# Patient Record
Sex: Female | Born: 1990 | Race: Black or African American | Hispanic: No | Marital: Married | Smoking: Never smoker
Health system: Southern US, Community
[De-identification: ages and names within clinical notes are randomized; demographics above are authoritative.]

## PROBLEM LIST (undated history)

## (undated) ENCOUNTER — Inpatient Hospital Stay (HOSPITAL_COMMUNITY): Payer: Self-pay

## (undated) DIAGNOSIS — N939 Abnormal uterine and vaginal bleeding, unspecified: Secondary | ICD-10-CM

## (undated) DIAGNOSIS — R600 Localized edema: Secondary | ICD-10-CM

## (undated) DIAGNOSIS — E669 Obesity, unspecified: Secondary | ICD-10-CM

## (undated) DIAGNOSIS — G8929 Other chronic pain: Secondary | ICD-10-CM

## (undated) DIAGNOSIS — T7840XA Allergy, unspecified, initial encounter: Secondary | ICD-10-CM

## (undated) DIAGNOSIS — I10 Essential (primary) hypertension: Secondary | ICD-10-CM

## (undated) DIAGNOSIS — J309 Allergic rhinitis, unspecified: Secondary | ICD-10-CM

## (undated) DIAGNOSIS — N12 Tubulo-interstitial nephritis, not specified as acute or chronic: Secondary | ICD-10-CM

## (undated) DIAGNOSIS — K219 Gastro-esophageal reflux disease without esophagitis: Secondary | ICD-10-CM

## (undated) DIAGNOSIS — M1990 Unspecified osteoarthritis, unspecified site: Secondary | ICD-10-CM

## (undated) DIAGNOSIS — K5909 Other constipation: Secondary | ICD-10-CM

## (undated) DIAGNOSIS — E785 Hyperlipidemia, unspecified: Secondary | ICD-10-CM

## (undated) DIAGNOSIS — Z6841 Body Mass Index (BMI) 40.0 and over, adult: Secondary | ICD-10-CM

## (undated) DIAGNOSIS — D500 Iron deficiency anemia secondary to blood loss (chronic): Secondary | ICD-10-CM

## (undated) DIAGNOSIS — R0789 Other chest pain: Secondary | ICD-10-CM

## (undated) DIAGNOSIS — R7303 Prediabetes: Secondary | ICD-10-CM

## (undated) DIAGNOSIS — Z973 Presence of spectacles and contact lenses: Secondary | ICD-10-CM

## (undated) DIAGNOSIS — Z8759 Personal history of other complications of pregnancy, childbirth and the puerperium: Secondary | ICD-10-CM

## (undated) DIAGNOSIS — E559 Vitamin D deficiency, unspecified: Secondary | ICD-10-CM

## (undated) DIAGNOSIS — O24419 Gestational diabetes mellitus in pregnancy, unspecified control: Secondary | ICD-10-CM

## (undated) DIAGNOSIS — R0600 Dyspnea, unspecified: Secondary | ICD-10-CM

## (undated) DIAGNOSIS — Z8632 Personal history of gestational diabetes: Secondary | ICD-10-CM

## (undated) DIAGNOSIS — Z8489 Family history of other specified conditions: Secondary | ICD-10-CM

## (undated) HISTORY — PX: WISDOM TOOTH EXTRACTION: SHX21

## (undated) HISTORY — DX: Allergy, unspecified, initial encounter: T78.40XA

## (undated) HISTORY — PX: NO PAST SURGERIES: SHX2092

---
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
## 2011-01-25 LAB — URINALYSIS, ROUTINE W REFLEX MICROSCOPIC
Bilirubin Urine: NEGATIVE
Glucose, UA: NEGATIVE mg/dL
Hgb urine dipstick: NEGATIVE
Ketones, ur: NEGATIVE mg/dL
Protein, ur: NEGATIVE mg/dL
Urobilinogen, UA: 0.2 mg/dL (ref 0.0–1.0)

## 2011-01-25 LAB — GC/CHLAMYDIA PROBE AMP, GENITAL: GC Probe Amp, Genital: POSITIVE — AB

## 2011-01-25 LAB — WET PREP, GENITAL: Trich, Wet Prep: NONE SEEN

## 2011-04-11 ENCOUNTER — Encounter: Payer: Self-pay | Admitting: Family Medicine

## 2011-10-06 ENCOUNTER — Emergency Department (HOSPITAL_COMMUNITY)
Admission: EM | Admit: 2011-10-06 | Discharge: 2011-10-06 | Disposition: A | Discharge status: Home / Self Care | Payer: No Typology Code available for payment source | Attending: Emergency Medicine | Admitting: Emergency Medicine

## 2011-10-06 ENCOUNTER — Encounter (HOSPITAL_COMMUNITY): Payer: Self-pay | Admitting: *Deleted

## 2011-10-06 DIAGNOSIS — M79609 Pain in unspecified limb: Secondary | ICD-10-CM | POA: Insufficient documentation

## 2011-10-06 DIAGNOSIS — IMO0002 Reserved for concepts with insufficient information to code with codable children: Secondary | ICD-10-CM | POA: Insufficient documentation

## 2011-10-06 DIAGNOSIS — T148XXA Other injury of unspecified body region, initial encounter: Secondary | ICD-10-CM

## 2011-10-06 DIAGNOSIS — J45909 Unspecified asthma, uncomplicated: Secondary | ICD-10-CM | POA: Insufficient documentation

## 2011-10-06 MED ORDER — TETANUS-DIPHTH-ACELL PERTUSSIS 5-2.5-18.5 LF-MCG/0.5 IM SUSP
0.5000 mL | Freq: Once | INTRAMUSCULAR | Status: AC
  Administered 2011-10-06: 0.5 mL via INTRAMUSCULAR
  Filled 2011-10-06: qty 0.5

## 2011-10-06 MED ORDER — IBUPROFEN 800 MG PO TABS
800.0000 mg | ORAL_TABLET | Freq: Once | ORAL | Status: AC
  Administered 2011-10-06: 800 mg via ORAL
  Filled 2011-10-06: qty 1

## 2011-10-06 NOTE — ED Notes (Signed)
Pt shows no signs of neuro deficits. A&O x4 moves all extremities well
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
## 2011-10-10 ENCOUNTER — Emergency Department (HOSPITAL_COMMUNITY)
Admission: EM | Admit: 2011-10-10 | Discharge: 2011-10-10 | Disposition: A | Discharge status: Home / Self Care | Payer: No Typology Code available for payment source | Attending: Emergency Medicine | Admitting: Emergency Medicine

## 2011-10-10 ENCOUNTER — Encounter (HOSPITAL_COMMUNITY): Payer: Self-pay | Admitting: *Deleted

## 2011-10-10 DIAGNOSIS — M79609 Pain in unspecified limb: Secondary | ICD-10-CM | POA: Insufficient documentation
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
## 2011-10-10 NOTE — ED Provider Notes (Signed)
Medical screening examination/treatment/procedure(s) were performed by non-physician practitioner and as supervising physician I was immediately available for consultation/collaboration.   Llewellyn Schoenberger M Monquie Fulgham, MD 10/10/11 2153 
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
## 2012-08-09 ENCOUNTER — Emergency Department (INDEPENDENT_AMBULATORY_CARE_PROVIDER_SITE_OTHER)
Admission: EM | Admit: 2012-08-09 | Discharge: 2012-08-09 | Disposition: A | Discharge status: Home / Self Care | Payer: Self-pay | Source: Home / Self Care | Attending: Emergency Medicine | Admitting: Emergency Medicine

## 2012-08-09 ENCOUNTER — Encounter (HOSPITAL_COMMUNITY): Payer: Self-pay | Admitting: Emergency Medicine

## 2012-08-09 DIAGNOSIS — A499 Bacterial infection, unspecified: Secondary | ICD-10-CM

## 2012-08-09 DIAGNOSIS — B9689 Other specified bacterial agents as the cause of diseases classified elsewhere: Secondary | ICD-10-CM

## 2012-08-09 DIAGNOSIS — N760 Acute vaginitis: Secondary | ICD-10-CM

## 2012-08-09 LAB — POCT URINALYSIS DIP (DEVICE)
Bilirubin Urine: NEGATIVE
Glucose, UA: NEGATIVE mg/dL
Hgb urine dipstick: NEGATIVE
Nitrite: NEGATIVE
Urobilinogen, UA: 0.2 mg/dL (ref 0.0–1.0)

## 2012-08-09 LAB — WET PREP, GENITAL: Yeast Wet Prep HPF POC: NONE SEEN

## 2012-08-09 MED ORDER — METRONIDAZOLE 500 MG PO TABS: 500.0000 mg | ORAL_TABLET | Freq: Two times a day (BID) | ORAL | Status: DC

## 2012-08-09 NOTE — ED Provider Notes (Signed)
Chief Complaint  Patient presents with  . Vaginal Discharge    History of Present Illness:   The patient is a 21 year old female who has had a two-day history of a white discharge and some vaginal irritation. She denies any itching or odor. No pelvic pain, no fever, chills, nausea, vomiting, or urinary symptoms. She is on Depo-Provera. She is sexually active. She's otherwise healthy except for asthma for which she uses albuterol inhaler as needed.  Review of Systems:  Other than noted above, the patient denies any of the following symptoms: Systemic:  No fever, chills, sweats, fatigue, or weight loss. GI:  No abdominal pain, nausea, anorexia, vomiting, diarrhea, constipation, melena or hematochezia. GU:  No dysuria, frequency, urgency, hematuria, vaginal discharge, itching, or abnormal vaginal bleeding. Skin:  No rash or itching.   PMFSH:  Past medical history, family history, social history, meds, and allergies were reviewed.  Physical Exam:   Vital signs:  BP 158/82  Pulse 75  Temp 98.6 F (37 C) (Oral)  Resp 17  SpO2 100% General:  Alert, oriented and in no distress. Lungs:  Breath sounds clear and equal bilaterally.  No wheezes, rales or rhonchi. Heart:  Regular rhythm.  No gallops or murmers. Abdomen:  Soft, flat and non-distended.  No organomegaly or mass.  No tenderness, guarding or rebound.  Bowel sounds normally active. Pelvic exam:  Normal external genitalia, vaginal and cervical mucosa were normal. There is a moderate amount of a thin, white, nonodorous discharge. No cervical motion pain. Uterus is normal in size and nontender. No adnexal masses or tenderness. Skin:  Clear, warm and dry.  Labs:   Results for orders placed during the hospital encounter of 08/09/12  POCT URINALYSIS DIP (DEVICE)      Component Value Range   Glucose, UA NEGATIVE  NEGATIVE mg/dL   Bilirubin Urine NEGATIVE  NEGATIVE   Ketones, ur NEGATIVE  NEGATIVE mg/dL   Specific Gravity, Urine 1.025   1.005 - 1.030   Hgb urine dipstick NEGATIVE  NEGATIVE   pH 5.5  5.0 - 8.0   Protein, ur NEGATIVE  NEGATIVE mg/dL   Urobilinogen, UA 0.2  0.0 - 1.0 mg/dL   Nitrite NEGATIVE  NEGATIVE   Leukocytes, UA TRACE (*) NEGATIVE  WET PREP, GENITAL      Component Value Range   Yeast Wet Prep HPF POC NONE SEEN  NONE SEEN   Trich, Wet Prep NONE SEEN  NONE SEEN   Clue Cells Wet Prep HPF POC MODERATE (*) NONE SEEN   WBC, Wet Prep HPF POC TOO NUMEROUS TO COUNT (*) NONE SEEN     Assessment:  The encounter diagnosis was Bacterial vaginosis.  Plan:   1.  The following meds were prescribed:   New Prescriptions   METRONIDAZOLE (FLAGYL) 500 MG TABLET    Take 1 tablet (500 mg total) by mouth 2 (two) times daily.   2.  The patient was instructed in symptomatic care and handouts were given. 3.  The patient was told to return if becoming worse in any way, if no better in 3 or 4 days, and given some red flag symptoms that would indicate earlier return.    Reuben Likes, MD 08/09/12 2202

## 2012-08-09 NOTE — ED Notes (Signed)
Pt c/o vaginal discharge x2 weeks... Sx include: white discharge, irritation... Denies: fevers, vomiting, nausea, diarrhea, urinary problems, pelvic pain, back pain... Pt is alert w/no signs of distress... Pt is sitting up right on exam table.

## 2012-08-11 LAB — GC/CHLAMYDIA PROBE AMP, GENITAL: Chlamydia, DNA Probe: NEGATIVE

## 2012-08-11 NOTE — ED Notes (Signed)
GC/Chlamydia neg., Wet prep: mod. clue cells, WBC's TNTC.  Pt. adequately treated with Flagyl. Lauren [name] 08/11/2012

## 2012-08-28 ENCOUNTER — Encounter (HOSPITAL_COMMUNITY): Payer: Self-pay | Admitting: Emergency Medicine

## 2012-08-28 ENCOUNTER — Emergency Department (INDEPENDENT_AMBULATORY_CARE_PROVIDER_SITE_OTHER)
Admission: EM | Admit: 2012-08-28 | Discharge: 2012-08-28 | Disposition: A | Discharge status: Home / Self Care | Payer: Self-pay | Source: Home / Self Care

## 2012-08-28 DIAGNOSIS — T7840XA Allergy, unspecified, initial encounter: Secondary | ICD-10-CM

## 2012-08-28 MED ORDER — PREDNISONE 20 MG PO TABS: ORAL_TABLET | ORAL | Status: DC

## 2012-08-28 MED ORDER — TRIAMCINOLONE ACETONIDE 40 MG/ML IJ SUSP
INTRAMUSCULAR | Status: AC
  Filled 2012-08-28: qty 5

## 2012-08-28 MED ORDER — TRIAMCINOLONE ACETONIDE 40 MG/ML IJ SUSP
40.0000 mg | Freq: Once | INTRAMUSCULAR | Status: AC
  Administered 2012-08-28: 40 mg via INTRAMUSCULAR

## 2012-08-28 NOTE — ED Provider Notes (Signed)
History     CSN: [id number]  Arrival date & time 08/28/12  1536   None     Chief Complaint  Patient presents with  . Rash    (Consider location/radiation/quality/duration/timing/severity/associated sxs/prior treatment) HPI Comments: After eating at a chicken restaurant last p.m. the patient and her mother developed itching. The patient also developed facial edema associated with itching primarily on her face neck and upper torso. She took a Benadryl and much of swelling and itching has decreased. He did not develop swelling or discomfort in the oropharynx or throat. Her mother showed me a photograph of when she had swelling of the face and lips. She is much improved based on the initial photograph. Her itching is minor nail and facial swelling his nearly abated.   Past Medical History  Diagnosis Date  . Allergy   . Asthma     History reviewed. No pertinent past surgical history.  Family History  Problem Relation Age of Onset  . Hypertension Mother   . Diabetes Father   . Cancer Other     History  Substance Use Topics  . Smoking status: Never Smoker   . Smokeless tobacco: Not on file  . Alcohol Use: No    OB History    Grav Para Term Preterm Abortions TAB SAB Ect Mult Living                  Review of Systems  Constitutional: Negative for fever, activity change and fatigue.  HENT: Positive for facial swelling. Negative for congestion, sore throat, rhinorrhea, neck pain and ear discharge.   Eyes: Negative.   Respiratory: Negative for cough, shortness of breath and wheezing.   Cardiovascular: Negative for chest pain and palpitations.  Gastrointestinal: Negative.   Genitourinary: Negative.   Musculoskeletal: Negative.   Skin: Positive for rash. Negative for color change and pallor.  Neurological: Negative.   Psychiatric/Behavioral: Negative.     Allergies  Review of patient's allergies indicates no known allergies.  Home Medications   Current Outpatient Rx   Name  Route  Sig  Dispense  Refill  . DIPHENHYDRAMINE HCL 25 MG PO CAPS   Oral   Take 25 mg by mouth every 6 (six) hours as needed.         Marland Kitchen MEDROXYPROGESTERONE ACETATE 150 MG/ML IM SUSP   Intramuscular   Inject 150 mg into the muscle every 3 (three) months.           Marland Kitchen METRONIDAZOLE 500 MG PO TABS   Oral   Take 1 tablet (500 mg total) by mouth 2 (two) times daily.   14 tablet   0   . PREDNISONE 20 MG PO TABS      Take 2 tabs q d for 2 days, then 1 tab for 2 days   6 tablet   0     BP 118/78  Pulse 118  Temp 98.4 F (36.9 C) (Oral)  Resp 20  SpO2 100%  Physical Exam  Constitutional: She is oriented to person, place, and time. She appears well-developed and well-nourished. No distress.  HENT:  Head: Normocephalic and atraumatic.  Nose: Nose normal.  Mouth/Throat: No oropharyngeal exudate.       No noticeable facial swelling during the exam. Oropharynx mildly erythematous with mild amount of PND  Eyes: EOM are normal. Pupils are equal, round, and reactive to light.  Neck: Normal range of motion. Neck supple.  Cardiovascular: Normal rate and normal heart sounds.   Pulmonary/Chest: Effort  normal and breath sounds normal. No respiratory distress. She has no wheezes. She has no rales.  Abdominal: Soft. There is no tenderness.  Musculoskeletal: Normal range of motion.  Lymphadenopathy:    She has no cervical adenopathy.  Neurological: She is alert and oriented to person, place, and time. No cranial nerve deficit.  Skin: Skin is warm and dry.  Psychiatric: She has a normal mood and affect.    ED Course  Procedures (including critical care time)  Labs Reviewed - No data to display No results found.   1. Allergic reaction       MDM  Continue the Benadryl 25 mg every 4 hours when necessary symptoms Kenalog 40 mg IM now Prednisone 40 mg every day for 2 days then 20 mg every day for 2 days. Per any new symptoms problems or worsening return of swelling or  itching or other symptoms either return to the emergency department.        Hayden Rasmussen, NP 08/28/12 1711

## 2012-08-28 NOTE — ED Notes (Addendum)
Reports swollen face and itchy skin.  Rash to face, itching on face and all over.  Denies fever, no bug bites.  Patient and mother think this is related to something they ate at a restaurant.  Both had itching, but patient seems to be itching worse and longer , and did have swelling

## 2012-08-29 NOTE — ED Provider Notes (Signed)
Medical screening examination/treatment/procedure(s) were performed by resident physician or non-physician practitioner and as supervising physician I was immediately available for consultation/collaboration.   Barkley Bruns MD.    Linna Hoff, MD 08/29/12 [phone]

## 2012-10-22 NOTE — L&D Delivery Note (Signed)
Delivery Note   Pt rapidly progressed to complete, I was called to BS w pt bearing down involuntarily, FHR overall reassuring w early variable decels, pt was on hands and knees, FHR decel to 50's as vtx began to crown, pt continued pushing well over 1-2 ctx   At 10:08 AM a viable female was delivered via Vaginal, Spontaneous Delivery (Presentation: Left Occiput Anterior).  Delivered through nuchal cord, cord was immediately clamped and cut and infant taken to warmer, APGAR: 2, 9; weight 6 lb 12.4 oz (3073 g).   Placenta status: Intact, Spontaneous.  Cord: 3 vessels with the following complications: None.  Cord pH: not collected   Anesthesia: None  Episiotomy: None Lacerations: 1st degree on R labia  Suture Repair: 3.0 vicryl rapide 4-0 monocryl  Est. Blood Loss (mL): 300  Mom to postpartum.  Baby to Couplet care / Skin to Skin. Pt plans to BF Mom and baby stable in recovery room Routine PP orders initiated   Jazilyn Siegenthaler M 09/17/2013, 8:20 PM

## 2013-01-29 ENCOUNTER — Encounter: Payer: Self-pay | Admitting: Family Medicine

## 2013-01-29 DIAGNOSIS — J309 Allergic rhinitis, unspecified: Secondary | ICD-10-CM | POA: Insufficient documentation

## 2013-01-30 ENCOUNTER — Ambulatory Visit (INDEPENDENT_AMBULATORY_CARE_PROVIDER_SITE_OTHER): Payer: Medicaid Other | Admitting: Physician Assistant

## 2013-01-30 ENCOUNTER — Encounter: Payer: Self-pay | Admitting: Physician Assistant

## 2013-01-30 VITALS — BP 128/78 | HR 100 | Temp 99.0°F | Resp 18 | Ht 64.0 in | Wt 231.0 lb

## 2013-01-30 DIAGNOSIS — N912 Amenorrhea, unspecified: Secondary | ICD-10-CM

## 2013-01-30 DIAGNOSIS — Z3201 Encounter for pregnancy test, result positive: Secondary | ICD-10-CM

## 2013-01-30 NOTE — Progress Notes (Signed)
   Patient ID: Lauren [name] MRN: [id number], DOB: 1991/03/26, 22 y.o. Date of Encounter: 01/30/2013, 1:08 PM    Chief Complaint:  Chief Complaint  Patient presents with  . thinks she pregnant    not sure about last menses  thinks last Depo shot was December     HPI: 22 y.o. year old female here for f/u. Says she did home pregnancy test 5 days ago and it was positive. Here for f/u. Has never seen a Gyn.  Was on DepoProvera here-she isnt sure when last injection was. Says she stopped doing them "just because she didn't want to keep doing it".   Also, she doesn't really pay attention to her menstrual cycle. Reports that she had a period in February but has no idea what date in February. Has had no menses "since February."  No prior pregnancy per pt report.   Home Meds:NONE                                     Allergies: No Known Allergies    Review of Systems: Pertinent ROS Negative   Physical Exam: Blood pressure 128/78, pulse 100, temperature 99 F (37.2 C), temperature source Oral, resp. rate 18, height 5\' 4"  (1.626 m), weight 231 lb (104.781 kg)., Body mass index is 39.63 kg/(m^2). General: Well developed, well nourished,AAF. in no acute distress.  Neck: Supple. No thyromegaly. Full ROM. No lymphadenopathy. Lungs: Clear bilaterally to auscultation without wheezes, rales, or rhonchi. Breathing is unlabored. Heart: RRR with S1 S2. No murmurs, rubs, or gallops appreciated. Abdomen: Soft, non-tender, non-distended with normoactive bowel sounds. No hepatomegaly. No rebound/guarding. No obvious abdominal masses. Msk:  Strength and tone normal for age. Extremities/Skin: Warm and dry. No clubbing or cyanosis. No edema. No rashes or suspicious lesions. Neuro: Alert and oriented X 3. Moves all extremities spontaneously. Gait is normal. CNII-XII grossly in tact. Psych:  Responds to questions appropriately with a normal affect.   Labs:  Results for orders placed in visit on  01/30/13  PREGNANCY, URINE      Result Value Range   Preg Test, Ur POS        ASSESSMENT AND PLAN:  22 y.o. year old female with  1. Amenorrhea - Pregnancy, urine positive  - Ambulatory referral to Obstetrics / Gynecology  2. Positive pregnancy test - Ambulatory referral to Obstetrics / Gynecology  She reports that she uses no cigarettes, no alcohol, no illicit drugs. Uses no Prescription Medications. Sometimes uses occasional advil. Told her to continue to avoid all of the above. Also told her to stop using Advil. Do not use any otc meds at all without first checking the OB. Start Centrum Multivitamin Today ASAP. Take this daily.  I will refer to Ob for f/u. She voices understanding and agrees.  Murray Hodgkins [place], [place], [facility] 01/30/2013 1:08 PM

## 2013-02-05 ENCOUNTER — Emergency Department (HOSPITAL_COMMUNITY): Payer: Medicaid Other

## 2013-02-05 ENCOUNTER — Encounter (HOSPITAL_COMMUNITY): Payer: Self-pay | Admitting: Emergency Medicine

## 2013-02-05 ENCOUNTER — Emergency Department (HOSPITAL_COMMUNITY)
Admission: EM | Admit: 2013-02-05 | Discharge: 2013-02-05 | Disposition: A | Discharge status: Home / Self Care | Payer: Medicaid Other | Attending: Emergency Medicine | Admitting: Emergency Medicine

## 2013-02-05 DIAGNOSIS — Z7982 Long term (current) use of aspirin: Secondary | ICD-10-CM | POA: Insufficient documentation

## 2013-02-05 DIAGNOSIS — O210 Mild hyperemesis gravidarum: Secondary | ICD-10-CM

## 2013-02-05 DIAGNOSIS — J45909 Unspecified asthma, uncomplicated: Secondary | ICD-10-CM | POA: Insufficient documentation

## 2013-02-05 DIAGNOSIS — R1011 Right upper quadrant pain: Secondary | ICD-10-CM | POA: Insufficient documentation

## 2013-02-05 DIAGNOSIS — O209 Hemorrhage in early pregnancy, unspecified: Secondary | ICD-10-CM | POA: Insufficient documentation

## 2013-02-05 DIAGNOSIS — O9989 Other specified diseases and conditions complicating pregnancy, childbirth and the puerperium: Secondary | ICD-10-CM | POA: Insufficient documentation

## 2013-02-05 DIAGNOSIS — O200 Threatened abortion: Secondary | ICD-10-CM

## 2013-02-05 DIAGNOSIS — Z8709 Personal history of other diseases of the respiratory system: Secondary | ICD-10-CM | POA: Insufficient documentation

## 2013-02-05 LAB — URINALYSIS, ROUTINE W REFLEX MICROSCOPIC
Bilirubin Urine: NEGATIVE
Glucose, UA: NEGATIVE mg/dL
Hgb urine dipstick: NEGATIVE
Ketones, ur: 40 mg/dL — AB
Leukocytes, UA: NEGATIVE
Nitrite: NEGATIVE
Protein, ur: NEGATIVE mg/dL
Specific Gravity, Urine: 1.025 (ref 1.005–1.030)
Urobilinogen, UA: 0.2 mg/dL (ref 0.0–1.0)
pH: 5.5 (ref 5.0–8.0)

## 2013-02-05 LAB — COMPREHENSIVE METABOLIC PANEL WITH GFR
ALT: 12 U/L (ref 0–35)
AST: 11 U/L (ref 0–37)
Albumin: 3.9 g/dL (ref 3.5–5.2)
Alkaline Phosphatase: 41 U/L (ref 39–117)
BUN: 13 mg/dL (ref 6–23)
CO2: 24 meq/L (ref 19–32)
Calcium: 9.7 mg/dL (ref 8.4–10.5)
Chloride: 99 meq/L (ref 96–112)
Creatinine, Ser: 0.66 mg/dL (ref 0.50–1.10)
GFR calc Af Amer: >90 mL/min
GFR calc non Af Amer: >90 mL/min
Glucose, Bld: 104 mg/dL — ABNORMAL HIGH (ref 70–99)
Potassium: 3.6 meq/L (ref 3.5–5.1)
Sodium: 134 meq/L — ABNORMAL LOW (ref 135–145)
Total Bilirubin: 0.3 mg/dL (ref 0.3–1.2)
Total Protein: 7.6 g/dL (ref 6.0–8.3)

## 2013-02-05 LAB — CBC WITH DIFFERENTIAL/PLATELET
Basophils Absolute: 0 10*3/uL (ref 0.0–0.1)
Basophils Relative: 0 % (ref 0–1)
Eosinophils Absolute: 0.1 10*3/uL (ref 0.0–0.7)
Eosinophils Relative: 1 % (ref 0–5)
HCT: 36.1 % (ref 36.0–46.0)
Hemoglobin: 12.7 g/dL (ref 12.0–15.0)
Lymphocytes Relative: 39 % (ref 12–46)
Lymphs Abs: 3.6 10*3/uL (ref 0.7–4.0)
MCH: 29.2 pg (ref 26.0–34.0)
MCHC: 35.2 g/dL (ref 30.0–36.0)
MCV: 83 fL (ref 78.0–100.0)
Monocytes Absolute: 0.5 10*3/uL (ref 0.1–1.0)
Monocytes Relative: 5 % (ref 3–12)
Neutro Abs: 5.2 10*3/uL (ref 1.7–7.7)
Neutrophils Relative %: 55 % (ref 43–77)
Platelets: 259 10*3/uL (ref 150–400)
RBC: 4.35 MIL/uL (ref 3.87–5.11)
RDW: 12.7 % (ref 11.5–15.5)
WBC: 9.4 10*3/uL (ref 4.0–10.5)

## 2013-02-05 LAB — TYPE AND SCREEN: Antibody Screen: NEGATIVE

## 2013-02-05 LAB — POCT PREGNANCY, URINE: Preg Test, Ur: POSITIVE — AB

## 2013-02-05 LAB — HCG, QUANTITATIVE, PREGNANCY: hCG, Beta Chain, Quant, S: 39721 m[IU]/mL — ABNORMAL HIGH

## 2013-02-05 LAB — ABO/RH: ABO/RH(D): AB NEG

## 2013-02-05 MED ORDER — RHO D IMMUNE GLOBULIN 1500 UNIT/2ML IJ SOLN
300.0000 ug | Freq: Once | INTRAMUSCULAR | Status: AC
  Administered 2013-02-05: 300 ug via INTRAVENOUS

## 2013-02-05 MED ORDER — ONDANSETRON HCL 4 MG/2ML IJ SOLN
4.0000 mg | Freq: Once | INTRAMUSCULAR | Status: AC
  Administered 2013-02-05: 4 mg via INTRAVENOUS
  Filled 2013-02-05: qty 2

## 2013-02-05 MED ORDER — SODIUM CHLORIDE 0.9 % IV BOLUS (SEPSIS)
1000.0000 mL | Freq: Once | INTRAVENOUS | Status: AC
  Administered 2013-02-05: 1000 mL via INTRAVENOUS

## 2013-02-05 MED ORDER — FOLIC ACID 1 MG PO TABS: 1.0000 mg | ORAL_TABLET | Freq: Every day | ORAL | Status: DC

## 2013-02-05 MED ORDER — ONDANSETRON 8 MG PO TBDP: 8.0000 mg | ORAL_TABLET | Freq: Three times a day (TID) | ORAL | Status: DC | PRN

## 2013-02-05 NOTE — ED Notes (Signed)
Pt dc'd home w/all belongings, alert and ambulatory upon dc, pt verbalizes understanding of dc instructions, 2 new rx given, pt driven home by family at bedside

## 2013-02-05 NOTE — ED Notes (Signed)
PT. REPORTS INTERMITTENT EMESIS / MID ABDOMINAL CRAMPING FOR SEVERAL DAYS , STATES POSITIVE HOME PREGNANCY TEST UNSURE OF AOG  AND LMP.

## 2013-02-05 NOTE — ED Notes (Signed)
Waiting for medication to come up from pharmacy before discharging pt home

## 2013-02-05 NOTE — ED Provider Notes (Signed)
History     CSN: [id number]  Arrival date & time 02/05/13  0017   First MD Initiated Contact with Patient 02/05/13 [phone]      Chief Complaint  Patient presents with  . Emesis During Pregnancy  . Abdominal Pain    (Consider location/radiation/quality/duration/timing/severity/associated sxs/prior treatment) HPI Comments: g1po patient comes in with cc of emesis, abd pain. Pt is not sure how far along she is, she has not seen an OB doctor yet. Pt has pain in the RUQ, and no pelvis pain. She also has been having nausea, but no emesis. No fevers, chills. No dizziness, lightheadedness. No hx of gall stones. No pelvic pain, uti like sx - but pt is indicating intermittent vaginal spotting. She is not sexually active at this point, no trauma.  Patient is a 22 y.o. female presenting with abdominal pain. The history is provided by the patient.  Abdominal Pain Associated symptoms: nausea and vaginal bleeding   Associated symptoms: no chest pain, no dysuria, no shortness of breath, no vaginal discharge and no vomiting     Past Medical History  Diagnosis Date  . Asthma   . Allergy     allergic rhinitis    History reviewed. No pertinent past surgical history.  Family History  Problem Relation Age of Onset  . Hypertension Mother   . Diabetes Father   . Cancer Other     History  Substance Use Topics  . Smoking status: Never Smoker   . Smokeless tobacco: Not on file  . Alcohol Use: No    OB History   Grav Para Term Preterm Abortions TAB SAB Ect Mult Living                  Review of Systems  Constitutional: Negative for activity change.  HENT: Negative for neck pain.   Respiratory: Negative for shortness of breath.   Cardiovascular: Negative for chest pain.  Gastrointestinal: Positive for nausea and abdominal pain. Negative for vomiting.  Genitourinary: Positive for vaginal bleeding. Negative for dysuria, frequency, flank pain, decreased urine volume and vaginal discharge.   Neurological: Negative for dizziness and headaches.    Allergies  Other  Home Medications   Current Outpatient Rx  Name  Route  Sig  Dispense  Refill  . acetaminophen (TYLENOL) 500 MG tablet   Oral   Take 500 mg by mouth every 6 (six) hours as needed for pain.         Marland Kitchen aspirin 325 MG tablet   Oral   Take 325 mg by mouth every 4 (four) hours as needed for pain.         Marland Kitchen ibuprofen (ADVIL,MOTRIN) 200 MG tablet   Oral   Take 400 mg by mouth every 6 (six) hours as needed for pain.           BP 116/55  Pulse 82  Temp(Src) 98.1 F (36.7 C) (Oral)  Resp 18  SpO2 100%  Physical Exam  Nursing note and vitals reviewed. Constitutional: She is oriented to person, place, and time. She appears well-developed and well-nourished.  HENT:  Head: Normocephalic and atraumatic.  Eyes: EOM are normal. Pupils are equal, round, and reactive to light.  Neck: Neck supple.  Cardiovascular: Normal rate, regular rhythm and normal heart sounds.   No murmur heard. Pulmonary/Chest: Effort normal. No respiratory distress.  Abdominal: Soft. She exhibits no distension. There is tenderness. There is no rebound and no guarding.  RUQ tendnerness, no murphys, no pelvic pain  Neurological: She is alert and oriented to person, place, and time.  Skin: Skin is warm and dry.    ED Course  Procedures (including critical care time)  Labs Reviewed  URINALYSIS, ROUTINE W REFLEX MICROSCOPIC - Abnormal; Notable for the following:    APPearance CLOUDY (*)    Ketones, ur 40 (*)    All other components within normal limits  COMPREHENSIVE METABOLIC PANEL - Abnormal; Notable for the following:    Sodium 134 (*)    Glucose, Bld 104 (*)    All other components within normal limits  HCG, QUANTITATIVE, PREGNANCY - Abnormal; Notable for the following:    hCG, Beta Chain, Quant, [place] [address] (*)    All other components within normal limits  POCT PREGNANCY, URINE - Abnormal; Notable for the following:    Preg  Test, Ur POSITIVE (*)    All other components within normal limits  CBC WITH DIFFERENTIAL  TYPE AND SCREEN  ABO/RH   US Abdomen Complete  02/05/2013  *RADIOLOGY REPORT*  Clinical Data:  Abdominal pain.  ABDOMINAL ULTRASOUND COMPLETE  Comparison:  None  Findings:  Gallbladder:  The gallbladder is normal in appearance, without evidence for gallstones, gallbladder wall thickening or pericholecystic fluid.  No ultrasonographic Murphy's sign is elicited.  Common Bile Duct:  0.2 cm in diameter; within normal limits in caliber.  Liver:  Normal parenchymal echogenicity and echotexture; no focal lesions identified.  Limited Doppler evaluation demonstrates normal blood flow within the liver.  IVC:  Unremarkable in appearance.  Pancreas:  Although the pancreas is difficult to visualize in its entirety due to overlying bowel gas, no focal pancreatic abnormality is identified.  Spleen:  9.9 cm in length; within normal limits in size and echotexture.  Right kidney:  10.5 cm in length; normal in size, configuration and parenchymal echogenicity.  No evidence of mass or hydronephrosis.  Left kidney:  10.9 cm in length; normal in size, configuration and parenchymal echogenicity.  No evidence of mass or hydronephrosis.  Abdominal Aorta:  Normal in caliber; no aneurysm identified.  IMPRESSION: Unremarkable abdominal ultrasound.   Original Report Authenticated By: Tonia Ghent, M.D.    US Ob Comp Less 14 Wks  02/05/2013  *RADIOLOGY REPORT*  Clinical Data: Abdominal pain.  OBSTETRIC <14 WK ULTRASOUND  Technique:  Transabdominal ultrasound was performed for evaluation of the gestation as well as the maternal uterus and adnexal regions.  Comparison:  None.  Intrauterine gestational sac: Visualized/normal in shape. Yolk sac: Yes Embryo: Yes Cardiac Activity: Yes Heart Rate: 150 bpm  CRL:  1.4 cm  7 w  4 d        [place] EDC: 09/20/2013  Maternal uterus/Adnexae: No subchorionic hemorrhage is noted.  The uterus is otherwise unremarkable  in appearance.  The ovaries are not visualized.  The adnexa are grossly unremarkable in appearance, though not well assessed.  No free fluid is seen within the pelvic cul-de-sac.  IMPRESSION: Single live intrauterine pregnancy noted, with a crown-rump length of 1.4 cm, corresponding to a gestational age of [redacted] weeks 4 days. This reflects an estimated date of delivery of September 20, 2013.   Original Report Authenticated By: Tonia Ghent, M.D.      No diagnosis found.    MDM  Pt comes in with cc of emesis, RUQ abd pain. Pt on ROS is positive for spotting.  Will get UA od the RUQ and also US pelvis to ensure there is no IUD or ectopic.  5:12 AM Rh Blood type is  negative. Will give rhogam.   Derwood Kaplan, MD 02/05/13 [id number]

## 2013-02-06 LAB — RH IG WORKUP (INCLUDES ABO/RH)
ABO/RH(D): AB NEG
Gestational Age(Wks): 7
Unit division: 0

## 2013-02-16 ENCOUNTER — Encounter: Payer: Self-pay | Admitting: Physician Assistant

## 2013-03-17 ENCOUNTER — Encounter (HOSPITAL_COMMUNITY): Payer: Self-pay | Admitting: *Deleted

## 2013-03-17 ENCOUNTER — Inpatient Hospital Stay (HOSPITAL_COMMUNITY)
Admission: AD | Admit: 2013-03-17 | Discharge: 2013-03-17 | Disposition: A | Discharge status: Home / Self Care | Payer: Medicaid Other | Source: Ambulatory Visit | Attending: Obstetrics & Gynecology | Admitting: Obstetrics & Gynecology

## 2013-03-17 DIAGNOSIS — R109 Unspecified abdominal pain: Secondary | ICD-10-CM | POA: Insufficient documentation

## 2013-03-17 DIAGNOSIS — O209 Hemorrhage in early pregnancy, unspecified: Secondary | ICD-10-CM

## 2013-03-17 LAB — WET PREP, GENITAL
Clue Cells Wet Prep HPF POC: NONE SEEN
Trich, Wet Prep: NONE SEEN
Yeast Wet Prep HPF POC: NONE SEEN

## 2013-03-17 NOTE — Progress Notes (Signed)
Pt states she is having spotting

## 2013-03-17 NOTE — MAU Note (Signed)
pT STATES SHE STARTED HAVING VAGINAL BLEEDING LAST NIGHT THAT BROWN AND HAS TURNED RED THIS MORNING

## 2013-03-17 NOTE — MAU Provider Note (Signed)
Chief Complaint: Vaginal Bleeding and Abdominal Pain   First Provider Initiated Contact with Patient 03/17/13 1611     SUBJECTIVE HPI: Lauren [name] is a 22 y.o. G1P0 at [redacted]w[redacted]d by LMP who presents with spotting per vagina that began yesterday and was brown, redder today. Not enough to require peripad. No abdominal pain. No antecedent intercourse or irritative vaginal discharge. Seen at [facility] 02/05/2013 for abdominal pain and had US showing viable IUP [redacted]w[redacted]d.  AB neg> received Rhophylac 02/07/13 Has applied for [facility] and in process of getting NOB appointment with PMD.   Past Medical History  Diagnosis Date  . Asthma   . Allergy     allergic rhinitis   OB History   Grav Para Term Preterm Abortions TAB SAB Ect Mult Living   1              # Outc Date GA Lbr Len/2nd Wgt Sex Del Anes PTL Lv   1 CUR              Past Surgical History  Procedure Laterality Date  . No past surgeries     History   Social History  . Marital Status: Single    Spouse Name: N/A    Number of Children: N/A  . Years of Education: N/A   Occupational History  . Not on file.   Social History Main Topics  . Smoking status: Never Smoker   . Smokeless tobacco: Not on file  . Alcohol Use: No  . Drug Use: No  . Sexually Active: Not on file   Other Topics Concern  . Not on file   Social History Narrative  . No narrative on file   No current facility-administered medications on file prior to encounter.   Current Outpatient Prescriptions on File Prior to Encounter  Medication Sig Dispense Refill  . acetaminophen (TYLENOL) 500 MG tablet Take 500 mg by mouth every 6 (six) hours as needed for pain.      Marland Kitchen aspirin 325 MG tablet Take 325 mg by mouth every 4 (four) hours as needed for pain.      . folic acid (FOLVITE) 1 MG tablet Take 1 tablet (1 mg total) by mouth daily.  100 tablet  3  . ibuprofen (ADVIL,MOTRIN) 200 MG tablet Take 400 mg by mouth every 6 (six) hours as needed for pain.       Allergies   Allergen Reactions  . Other Hives and Rash    zaxby's zax sauce.    ROS: Pertinent items in HPI  OBJECTIVE Blood pressure 122/78, pulse 85, temperature 99.2 F (37.3 C), temperature source Oral, resp. rate 16. GENERAL: Obese female in no acute distress.  HEENT: Normocephalic HEART: normal rate RESP: normal effort ABDOMEN: Soft, non-tender EXTREMITIES: Nontender, no edema NEURO: Alert and oriented SPECULUM EXAM: NEFG, small amount dark brown blood noted, cervix clean BIMANUAL: cervix L/C; uterus 10-12 wk size, no adnexal tenderness or masses  LAB RESULTS Results for orders placed during the hospital encounter of 03/17/13 (from the past 24 hour(s))  WET PREP, GENITAL     Status: Abnormal   Collection Time    03/17/13  4:35 PM      Result Value Range   Yeast Wet Prep HPF POC NONE SEEN  NONE SEEN   Trich, Wet Prep NONE SEEN  NONE SEEN   Clue Cells Wet Prep HPF POC NONE SEEN  NONE SEEN   WBC, Wet Prep HPF POC FEW (*) NONE SEEN  IMAGING Bedside US> family viewed FM and normal cardiac activity.  MAU COURSE GC/CT sent  ASSESSMENT 1. Bleeding in early pregnancy   Viable IUP [redacted]w[redacted]d   PLAN Explained possible etiologies of mucosal irritation or infection, [facility] Discharge home with pelvic rest until NOB    Medication List    ASK your doctor about these medications       acetaminophen 500 MG tablet  Commonly known as:  TYLENOL  Take 500 mg by mouth every 6 (six) hours as needed for pain.     aspirin 325 MG tablet  Take 325 mg by mouth every 4 (four) hours as needed for pain.     folic acid 1 MG tablet  Commonly known as:  FOLVITE  Take 1 tablet (1 mg total) by mouth daily.     ibuprofen 200 MG tablet  Commonly known as:  ADVIL,MOTRIN  Take 400 mg by mouth every 6 (six) hours as needed for pain.     prenatal multivitamin Tabs  Take 1 tablet by mouth daily at 12 noon.       Follow-up Information   Schedule an appointment as soon as possible for a visit with  Kathreen Cosier, MD.   Contact information:   [address] [place] [address] [phone]       Danae Orleans, CNM 03/17/2013  4:12 PM

## 2013-03-17 NOTE — Progress Notes (Signed)
Pt states she was having pain when she was sitting in the waiting room

## 2013-03-31 LAB — OB RESULTS CONSOLE HIV ANTIBODY (ROUTINE TESTING): HIV: NONREACTIVE

## 2013-03-31 LAB — OB RESULTS CONSOLE RUBELLA ANTIBODY, IGM: Rubella: IMMUNE

## 2013-03-31 LAB — OB RESULTS CONSOLE RPR: RPR: NONREACTIVE

## 2013-03-31 LAB — OB RESULTS CONSOLE HEPATITIS B SURFACE ANTIGEN: Hepatitis B Surface Ag: NEGATIVE

## 2013-07-20 ENCOUNTER — Ambulatory Visit: Payer: Medicaid Other | Admitting: Dietician

## 2013-07-22 ENCOUNTER — Encounter: Payer: Medicaid Other | Attending: Obstetrics and Gynecology

## 2013-07-22 VITALS — Ht 66.0 in | Wt 258.0 lb

## 2013-07-22 DIAGNOSIS — Z713 Dietary counseling and surveillance: Secondary | ICD-10-CM | POA: Insufficient documentation

## 2013-07-22 DIAGNOSIS — O99810 Abnormal glucose complicating pregnancy: Secondary | ICD-10-CM

## 2013-07-22 NOTE — Progress Notes (Signed)
  Patient was seen on 07/22/13 for Gestational Diabetes self-management class at the [facility]. The following learning objectives were met by the patient during this course:   States the definition of Gestational Diabetes  States why dietary management is important in controlling blood glucose  Describes the effects of carbohydrates on blood glucose levels  Demonstrates ability to create a balanced meal plan  Demonstrates carbohydrate counting   States when to check blood glucose levels  Demonstrates proper blood glucose monitoring techniques  States the effect of stress and exercise on blood glucose levels  States the importance of limiting caffeine and abstaining from alcohol and smoking  Plan:  Aim for 2 Carb Choices per meal (30 grams) +/- 1 either way for breakfast Aim for 3 Carb Choices per meal (45 grams) +/- 1 either way from lunch and dinner Aim for 1-2 Carbs per snack Begin reading food labels for Total Carbohydrate and sugar grams of foods Consider  increasing your activity level by walking daily as tolerated Begin checking BG at alternate times per day to include before breakfast and 1-2 hours after first bit of breakfast, lunch and dinner after  as directed by MD  Take medication  as directed by MD  Blood glucose monitor given: None per referral  Patient instructed to monitor glucose levels: FBS: 60 - <90 1 hour: <140 2 hour: <120  Patient received the following handouts:  Nutrition Diabetes and Pregnancy  Carbohydrate Counting List  Meal Planning worksheet  Patient will be seen for follow-up as needed.

## 2013-07-30 ENCOUNTER — Inpatient Hospital Stay (HOSPITAL_COMMUNITY)
Admission: AD | Admit: 2013-07-30 | Discharge: 2013-07-31 | Disposition: A | Discharge status: Home / Self Care | Payer: Medicaid Other | Source: Ambulatory Visit | Attending: Obstetrics and Gynecology | Admitting: Obstetrics and Gynecology

## 2013-07-30 ENCOUNTER — Encounter (HOSPITAL_COMMUNITY): Payer: Self-pay | Admitting: *Deleted

## 2013-07-30 DIAGNOSIS — Z6841 Body Mass Index (BMI) 40.0 and over, adult: Secondary | ICD-10-CM

## 2013-07-30 DIAGNOSIS — O99810 Abnormal glucose complicating pregnancy: Secondary | ICD-10-CM | POA: Insufficient documentation

## 2013-07-30 DIAGNOSIS — O360990 Maternal care for other rhesus isoimmunization, unspecified trimester, not applicable or unspecified: Secondary | ICD-10-CM | POA: Insufficient documentation

## 2013-07-30 DIAGNOSIS — Z6791 Unspecified blood type, Rh negative: Secondary | ICD-10-CM | POA: Diagnosis present

## 2013-07-30 DIAGNOSIS — O4700 False labor before 37 completed weeks of gestation, unspecified trimester: Secondary | ICD-10-CM | POA: Insufficient documentation

## 2013-07-30 DIAGNOSIS — O24419 Gestational diabetes mellitus in pregnancy, unspecified control: Secondary | ICD-10-CM | POA: Diagnosis present

## 2013-07-30 LAB — URINALYSIS, ROUTINE W REFLEX MICROSCOPIC
Glucose, UA: NEGATIVE mg/dL
Hgb urine dipstick: NEGATIVE
Protein, ur: NEGATIVE mg/dL

## 2013-07-30 NOTE — MAU Provider Note (Signed)
History   22 yo G1P0 at 64 5/7 weeks presented via EMS with onset of cramping and back pain around 7:30pm tonight.  Denies leaking or bleeding, reports +FM.  After questioning, admits to IC just prior to onset of cramping.    Patient Active Problem List   Diagnosis Date Noted  . Gestational diabetes 07/31/2013  . Rh negative state in antepartum period 07/31/2013  . BMI 40.0-44.9, adult 07/31/2013  . Allergic rhinitis 01/29/2013      HPI:  See above  OB History   Grav Para Term Preterm Abortions TAB SAB Ect Mult Living   1               Past Medical History  Diagnosis Date  . Asthma   . Allergy     allergic rhinitis    Past Surgical History  Procedure Laterality Date  . No past surgeries      Family History  Problem Relation Age of Onset  . Hypertension Mother   . Diabetes Father   . Cancer Other   . Asthma Other     History  Substance Use Topics  . Smoking status: Never Smoker   . Smokeless tobacco: Not on file  . Alcohol Use: No    Allergies:  Allergies  Allergen Reactions  . Other Hives and Rash    zaxby's zax sauce.    No prescriptions prior to admission    ROS:  Cramping, +FM. Physical Exam   Blood pressure 116/67, pulse 86, temperature 98.7 F (37.1 C), temperature source Oral, resp. rate 18.  Physical Exam Chest clear Heart RRR without murmur Abd gravid, NT Pelvic--cervix closed, long, vtx, -2, no d/c in vault Ext WNL  FHR Category 1 UCs--occasional, mild, some irritability.  Results for orders placed during the hospital encounter of 07/30/13 (from the past 24 hour(s))  URINALYSIS, ROUTINE W REFLEX MICROSCOPIC     Status: None   Collection Time    07/30/13  9:05 PM      Result Value Range   Color, Urine YELLOW  YELLOW   APPearance CLEAR  CLEAR   Specific Gravity, Urine 1.025  1.005 - 1.030   pH 6.5  5.0 - 8.0   Glucose, UA NEGATIVE  NEGATIVE mg/dL   Hgb urine dipstick NEGATIVE  NEGATIVE   Bilirubin Urine NEGATIVE  NEGATIVE   Ketones, ur NEGATIVE  NEGATIVE mg/dL   Protein, ur NEGATIVE  NEGATIVE mg/dL   Urobilinogen, UA 0.2  0.0 - 1.0 mg/dL   Nitrite NEGATIVE  NEGATIVE   Leukocytes, UA NEGATIVE  NEGATIVE  WET PREP, GENITAL     Status: Abnormal   Collection Time    07/30/13 11:50 PM      Result Value Range   Yeast Wet Prep HPF POC NONE SEEN  NONE SEEN   Trich, Wet Prep NONE SEEN  NONE SEEN   Clue Cells Wet Prep HPF POC NONE SEEN  NONE SEEN   WBC, Wet Prep HPF POC FEW (*) NONE SEEN   Feeling much better after period of rest in MAU.  ED Course  IUP at 32 5/7 weeks Uterine activity after IC  Plan: D/C home--will defer FFN at present due to resolution of UCs and no cervical change. F/u as scheduled at [facility] 08/11/13. S/S of PTL reviewed. Recommended deferring IC until re-evaluation at next office visit.    Nigel Bridgeman CNM, [place] 07/31/2013 1:58 AM

## 2013-07-31 DIAGNOSIS — Z6791 Unspecified blood type, Rh negative: Secondary | ICD-10-CM | POA: Diagnosis present

## 2013-07-31 DIAGNOSIS — O24419 Gestational diabetes mellitus in pregnancy, unspecified control: Secondary | ICD-10-CM | POA: Diagnosis present

## 2013-07-31 DIAGNOSIS — Z6841 Body Mass Index (BMI) 40.0 and over, adult: Secondary | ICD-10-CM

## 2013-07-31 LAB — WET PREP, GENITAL: Yeast Wet Prep HPF POC: NONE SEEN

## 2013-08-02 LAB — CULTURE, BETA STREP (GROUP B ONLY)

## 2013-08-26 LAB — OB RESULTS CONSOLE GC/CHLAMYDIA
Chlamydia: NEGATIVE
Gonorrhea: NEGATIVE

## 2013-09-16 ENCOUNTER — Encounter (HOSPITAL_COMMUNITY): Payer: Self-pay

## 2013-09-16 ENCOUNTER — Inpatient Hospital Stay (HOSPITAL_COMMUNITY)
Admission: AD | Admit: 2013-09-16 | Discharge: 2013-09-19 | DRG: 774 | Disposition: A | Discharge status: Home / Self Care | Payer: Medicaid Other | Source: Ambulatory Visit | Attending: Obstetrics and Gynecology | Admitting: Obstetrics and Gynecology

## 2013-09-16 DIAGNOSIS — O360990 Maternal care for other rhesus isoimmunization, unspecified trimester, not applicable or unspecified: Secondary | ICD-10-CM | POA: Diagnosis present

## 2013-09-16 DIAGNOSIS — O1490 Unspecified pre-eclampsia, unspecified trimester: Secondary | ICD-10-CM | POA: Diagnosis present

## 2013-09-16 DIAGNOSIS — IMO0002 Reserved for concepts with insufficient information to code with codable children: Principal | ICD-10-CM | POA: Insufficient documentation

## 2013-09-16 DIAGNOSIS — O365990 Maternal care for other known or suspected poor fetal growth, unspecified trimester, not applicable or unspecified: Secondary | ICD-10-CM | POA: Diagnosis present

## 2013-09-16 HISTORY — DX: Gestational diabetes mellitus in pregnancy, unspecified control: O24.419

## 2013-09-16 LAB — COMPREHENSIVE METABOLIC PANEL
ALT: 10 U/L (ref 0–35)
AST: 10 U/L (ref 0–37)
BUN: 9 mg/dL (ref 6–23)
CO2: 22 mEq/L (ref 19–32)
Calcium: 9.1 mg/dL (ref 8.4–10.5)
Chloride: 103 mEq/L (ref 96–112)
Creatinine, Ser: 0.59 mg/dL (ref 0.50–1.10)
GFR calc Af Amer: >90 mL/min (ref >90)
Glucose, Bld: 102 mg/dL — ABNORMAL HIGH (ref 70–99)
Sodium: 136 mEq/L (ref 135–145)
Total Bilirubin: 0.3 mg/dL (ref 0.3–1.2)
Total Protein: 5.9 g/dL — ABNORMAL LOW (ref 6.0–8.3)

## 2013-09-16 LAB — CBC
Hemoglobin: 12 g/dL (ref 12.0–15.0)
MCHC: 33.9 g/dL (ref 30.0–36.0)
Platelets: 171 10*3/uL (ref 150–400)
RDW: 14.9 % (ref 11.5–15.5)

## 2013-09-16 LAB — GLUCOSE, CAPILLARY: Glucose-Capillary: 89 mg/dL (ref 70–99)

## 2013-09-16 LAB — LACTATE DEHYDROGENASE: LDH: 211 U/L (ref 94–250)

## 2013-09-16 MED ORDER — OXYTOCIN 40 UNITS IN LACTATED RINGERS INFUSION - SIMPLE MED
1.0000 m[IU]/min | INTRAVENOUS | Status: DC
  Administered 2013-09-16: 1 m[IU]/min via INTRAVENOUS
  Filled 2013-09-16: qty 1000

## 2013-09-16 MED ORDER — ACETAMINOPHEN 325 MG PO TABS
650.0000 mg | ORAL_TABLET | ORAL | Status: DC | PRN
  Administered 2013-09-17: 650 mg via ORAL
  Filled 2013-09-16: qty 2

## 2013-09-16 MED ORDER — LIDOCAINE HCL (PF) 1 % IJ SOLN
30.0000 mL | INTRAMUSCULAR | Status: AC | PRN
  Administered 2013-09-17: 30 mL via SUBCUTANEOUS
  Filled 2013-09-16 (×2): qty 30

## 2013-09-16 MED ORDER — OXYTOCIN BOLUS FROM INFUSION
500.0000 mL | INTRAVENOUS | Status: DC
  Administered 2013-09-17 (×2): 500 mL via INTRAVENOUS

## 2013-09-16 MED ORDER — IBUPROFEN 600 MG PO TABS: 600.0000 mg | ORAL_TABLET | Freq: Four times a day (QID) | ORAL | Status: DC | PRN

## 2013-09-16 MED ORDER — OXYTOCIN 40 UNITS IN LACTATED RINGERS INFUSION - SIMPLE MED: 62.5000 mL/h | INTRAVENOUS | Status: DC

## 2013-09-16 MED ORDER — LACTATED RINGERS IV SOLN
500.0000 mL | INTRAVENOUS | Status: DC | PRN
  Administered 2013-09-17: 300 mL via INTRAVENOUS

## 2013-09-16 MED ORDER — ONDANSETRON HCL 4 MG/2ML IJ SOLN: 4.0000 mg | Freq: Four times a day (QID) | INTRAMUSCULAR | Status: DC | PRN

## 2013-09-16 MED ORDER — OXYCODONE-ACETAMINOPHEN 5-325 MG PO TABS: 1.0000 | ORAL_TABLET | ORAL | Status: DC | PRN

## 2013-09-16 MED ORDER — LACTATED RINGERS IV SOLN
INTRAVENOUS | Status: DC
  Administered 2013-09-16 – 2013-09-17 (×2): via INTRAVENOUS

## 2013-09-16 MED ORDER — FLEET ENEMA 7-19 GM/118ML RE ENEM: 1.0000 | ENEMA | RECTAL | Status: DC | PRN

## 2013-09-16 MED ORDER — TERBUTALINE SULFATE 1 MG/ML IJ SOLN: 0.2500 mg | Freq: Once | INTRAMUSCULAR | Status: AC | PRN

## 2013-09-16 MED ORDER — LABETALOL HCL 5 MG/ML IV SOLN
10.0000 mg | INTRAVENOUS | Status: DC | PRN
  Administered 2013-09-16: 10 mg via INTRAVENOUS
  Administered 2013-09-16: 20 mg via INTRAVENOUS
  Administered 2013-09-16 – 2013-09-17 (×4): 10 mg via INTRAVENOUS
  Filled 2013-09-16 (×6): qty 4

## 2013-09-16 MED ORDER — CITRIC ACID-SODIUM CITRATE 334-500 MG/5ML PO SOLN: 30.0000 mL | ORAL | Status: DC | PRN

## 2013-09-16 NOTE — MAU Note (Signed)
Pt states was told to come in for induction, BP keeps going up, and baby 's wt not going up.

## 2013-09-16 NOTE — H&P (Signed)
Lauren [name] is a 22 y.o. female, G1P0 at 43 3/7 weeks, presenting for induction due to IUGR noted on [place] today and ? Gestational hypertension, abnormal glucose metabolism.  Had elevated 1 hour GTT at 20 weeks, then elevated FBS on 3 hour GTT--sent to [facility] for diabetic teaching, but has not been checking CBGs.  Seen at office today, with EFW < 10%ile, normal fluid.  Also had 2+ proteinuria on a voided specimen.  Denies HA, visual sx, or epigastric pain.  PIH labs were WNL on 11/19 at 38 3/7 weeks, with P/C ratio ).13.  Patient Active Problem List   Diagnosis Date Noted  . IUGR (intrauterine growth restriction) 09/16/2013  . Gestational diabetes 07/31/2013  . Rh negative state in antepartum period 07/31/2013  . BMI 40.0-44.9, adult 07/31/2013  . Allergic rhinitis 01/29/2013  No clear dx of GDM--had elevated FBS on 3 hour GTT.  History of present pregnancy: Patient entered care at 17 weeks.   EDC of 09/20/13 was established by LMP, and was in agreement with early [place] in MAU at 7 4/7 weeks.   Anatomy scan:  20 3/7 weeks, with normal findings and an anterior placenta.   Additional [place] evaluations:   9/30:  EFW 67%ile, normal fluid 10/9:  EFW 35.4%ile, 4+15, normal fluid, 8/8 BPP 11/26:  EFW < 10%ile, 5+14, AFI 12.1, 50%ile, BPP 8/8, normal Dopplers, vtx   Significant prenatal events:  Chest rash at 29 weeks, treated with Nystatin powder, but minimal resolution.  Initial urine culture + for Enterococcus, 25K--treated, no TOC done.  Antibody screen + anti-D at NOB, with titer < 2.  TDaP and Rhophylac given 9/16 (had received Rhophylac in MAU early pregnancy).  FBS 94 at office visit at 34 weeks. Last evaluation:  09/16/13--cervix 2, 70, -3, vtx, +2 protein on a voided specimen.  US showed IUGC at < 10%ile, normal BPP and Dopplers.  OB History   Grav Para Term Preterm Abortions TAB SAB Ect Mult Living   1              Past Medical History  Diagnosis Date  . Asthma   . Allergy    allergic rhinitis  . Gestational diabetes    Past Surgical History  Procedure Laterality Date  . No past surgeries     Family History: family history includes Asthma in her other; Cancer in her other; Diabetes in her father; Hypertension in her mother.  Social History:  reports that she has never smoked. She does not have any smokeless tobacco history on file. She reports that she does not drink alcohol or use illicit drugs. FOB, Jaynie Collins, is present and supportive, along with patient's mother and sister.  Patient has been employed as a Estate agent.  No religious affiliation is identified.   Prenatal Transfer Tool  Maternal Diabetes: No--but elevated FBS on 3 hour GTT.  No monitoring of CBGs during pregnancy Genetic Screening: Normal Quad screen Maternal Ultrasounds/Referrals: Abnormal:  Findings:   IUGR on [place] today--EFW < 10%ile, normal fluid Fetal Ultrasounds or other Referrals:  None Maternal Substance Abuse:  No Significant Maternal Medications:  None Significant Maternal Lab Results: Lab values include: Group B Strep negative, Rh negative    ROS:  Occasional contractions, +FM  Allergies  Allergen Reactions  . Bee Venom Swelling  . Other Hives and Rash    zaxby's zax sauce.     Dilation: 2 Effacement (%): 70 Exam by:: v. Jamielyn Petrucci, cnm Blood pressure 158/92, pulse 76, temperature 97.5  F (36.4 C), temperature source Oral, resp. rate 20, height 5\' 6"  (1.676 m), weight 282 lb (127.914 kg).  Chest clear Heart RRR without murmur Abd gravid, NT, FH 37 cm Pelvic: cervix posterior, 2 cm, 70%, vtx, -2 Ext: DTR 1+, ? 1 beat clonus on right, 1+ edema noted.  FHR: Category 1 UCs:  None  Prenatal labs: ABO, Rh: --/--/AB NEG (04/17 0615) Antibody: NEG (04/17 0615) Rubella:   Immune RPR: Nonreactive (06/10 0000)NR HBsAg: Negative (06/10 0000) Neg HIV: Non-reactive, Non-reactive (06/10 0000)  GBS: Negative (11/05 0000) Sickle cell/Hgb electrophoresis:  AA Pap:  WNL  03/2013 GC:  Negative at NOB and in 3rd trimester Chlamydia:  Negative at NOB and in 3rd trimester Genetic screenings:  Normal Quad screen Glucola:  Elevated 1 hour glucola at 20 weeks (153), then elevated FBS on 3 hour GTT.   Other:  Positive anti-D antibodies at NOB, < 2. Positive urine culture 25K Enterococcus at NOB. Normal PIH labs on 11/19, with normal P/C ratio. Hgb 12.1 at NOB, 12.2 11/19.    Assessment/Plan: IUP at 39 3/7 weeks IUGR Probable insulin resistance Elevated BP, r/o pre-eclampsia  Plan: Admitted to [facility] per consult with Dr. Charlotta Newton. Routine [facility] orders Plan pitocin induction, AROM as appropriate. I&O CBGs q 4 hours. Labetalol prn for elevated BPs  PIH labs with protein/creatnine ratio. Magnesium sulfate if dx pre-eclampsia.  Reviewed potential need for magnesium with patient and family. Patient currently prefers to avoid epidural--reviewed BP issue with patient and recommended she consider epidural for BP benefit as labor progresses. Reviewed rationale for induction and R&B of induction with patient, partner, and family, including possible prolonged labor, need for further intervention, and possible need for C/S--patient and family seem to understand these risks and are agreeable with plan to proceed with pitocin induction.  Nyra Capes, [place] 09/16/2013, 9:38 PM

## 2013-09-16 NOTE — MAU Note (Signed)
Pt here for induction. Not seen in MAU.

## 2013-09-16 NOTE — Progress Notes (Signed)
Informed patient we would begin keeping up with her intake & output due to urine appearance & increased blood pressures.

## 2013-09-17 ENCOUNTER — Encounter (HOSPITAL_COMMUNITY): Payer: Self-pay | Admitting: *Deleted

## 2013-09-17 DIAGNOSIS — O1490 Unspecified pre-eclampsia, unspecified trimester: Secondary | ICD-10-CM | POA: Diagnosis present

## 2013-09-17 LAB — GLUCOSE, CAPILLARY: Glucose-Capillary: 87 mg/dL (ref 70–99)

## 2013-09-17 LAB — COMPREHENSIVE METABOLIC PANEL
ALT: 10 U/L (ref 0–35)
AST: 13 U/L (ref 0–37)
Albumin: 2.6 g/dL — ABNORMAL LOW (ref 3.5–5.2)
BUN: 7 mg/dL (ref 6–23)
Calcium: 8.5 mg/dL (ref 8.4–10.5)
Creatinine, Ser: 0.54 mg/dL (ref 0.50–1.10)
GFR calc Af Amer: >90 mL/min (ref >90)
GFR calc non Af Amer: >90 mL/min (ref >90)
Total Bilirubin: 0.3 mg/dL (ref 0.3–1.2)

## 2013-09-17 LAB — CBC
HCT: 33.8 % — ABNORMAL LOW (ref 36.0–46.0)
MCH: 28.7 pg (ref 26.0–34.0)
MCV: 83 fL (ref 78.0–100.0)
Platelets: 166 10*3/uL (ref 150–400)
RDW: 14.8 % (ref 11.5–15.5)

## 2013-09-17 LAB — PROTEIN / CREATININE RATIO, URINE
Creatinine, Urine: 240.38 mg/dL
Total Protein, Urine: 316.9 mg/dL

## 2013-09-17 LAB — LACTATE DEHYDROGENASE: LDH: 281 U/L — ABNORMAL HIGH (ref 94–250)

## 2013-09-17 LAB — MRSA PCR SCREENING: MRSA by PCR: NEGATIVE

## 2013-09-17 LAB — RPR: RPR Ser Ql: NONREACTIVE

## 2013-09-17 LAB — URIC ACID: Uric Acid, Serum: 4.4 mg/dL (ref 2.4–7.0)

## 2013-09-17 MED ORDER — EPHEDRINE 5 MG/ML INJ
10.0000 mg | INTRAVENOUS | Status: DC | PRN
  Filled 2013-09-17: qty 2

## 2013-09-17 MED ORDER — DIPHENHYDRAMINE HCL 50 MG/ML IJ SOLN: 12.5000 mg | INTRAMUSCULAR | Status: DC | PRN

## 2013-09-17 MED ORDER — PHENYLEPHRINE 40 MCG/ML (10ML) SYRINGE FOR IV PUSH (FOR BLOOD PRESSURE SUPPORT)
80.0000 ug | PREFILLED_SYRINGE | INTRAVENOUS | Status: DC | PRN
  Filled 2013-09-17: qty 2

## 2013-09-17 MED ORDER — BUTORPHANOL TARTRATE 1 MG/ML IJ SOLN: 1.0000 mg | INTRAMUSCULAR | Status: DC | PRN

## 2013-09-17 MED ORDER — ZOLPIDEM TARTRATE 5 MG PO TABS: 5.0000 mg | ORAL_TABLET | Freq: Every evening | ORAL | Status: DC | PRN

## 2013-09-17 MED ORDER — LABETALOL HCL 5 MG/ML IV SOLN
10.0000 mg | INTRAVENOUS | Status: DC | PRN
  Administered 2013-09-17: 20 mg via INTRAVENOUS
  Administered 2013-09-17: 10 mg via INTRAVENOUS
  Filled 2013-09-17: qty 4
  Filled 2013-09-17: qty 8
  Filled 2013-09-17: qty 4

## 2013-09-17 MED ORDER — BUTORPHANOL TARTRATE 1 MG/ML IJ SOLN
2.0000 mg | INTRAMUSCULAR | Status: DC | PRN
  Administered 2013-09-17: 2 mg via INTRAVENOUS
  Filled 2013-09-17: qty 2

## 2013-09-17 MED ORDER — DIPHENHYDRAMINE HCL 25 MG PO CAPS: 25.0000 mg | ORAL_CAPSULE | Freq: Four times a day (QID) | ORAL | Status: DC | PRN

## 2013-09-17 MED ORDER — FENTANYL 2.5 MCG/ML BUPIVACAINE 1/10 % EPIDURAL INFUSION (WH - ANES): 14.0000 mL/h | INTRAMUSCULAR | Status: DC | PRN

## 2013-09-17 MED ORDER — PRENATAL MULTIVITAMIN CH
1.0000 | ORAL_TABLET | Freq: Every day | ORAL | Status: DC
  Administered 2013-09-18 – 2013-09-19 (×2): 1 via ORAL
  Filled 2013-09-17 (×2): qty 1

## 2013-09-17 MED ORDER — TETANUS-DIPHTH-ACELL PERTUSSIS 5-2.5-18.5 LF-MCG/0.5 IM SUSP
0.5000 mL | Freq: Once | INTRAMUSCULAR | Status: DC
  Filled 2013-09-17: qty 0.5

## 2013-09-17 MED ORDER — MISOPROSTOL 200 MCG PO TABS
ORAL_TABLET | ORAL | Status: AC
  Filled 2013-09-17: qty 5

## 2013-09-17 MED ORDER — MEASLES, MUMPS & RUBELLA VAC ~~LOC~~ INJ
0.5000 mL | INJECTION | Freq: Once | SUBCUTANEOUS | Status: DC
  Filled 2013-09-17: qty 0.5

## 2013-09-17 MED ORDER — OXYCODONE-ACETAMINOPHEN 5-325 MG PO TABS: 1.0000 | ORAL_TABLET | ORAL | Status: DC | PRN

## 2013-09-17 MED ORDER — SIMETHICONE 80 MG PO CHEW: 80.0000 mg | CHEWABLE_TABLET | ORAL | Status: DC | PRN

## 2013-09-17 MED ORDER — IBUPROFEN 600 MG PO TABS
600.0000 mg | ORAL_TABLET | Freq: Four times a day (QID) | ORAL | Status: DC
  Administered 2013-09-17 – 2013-09-19 (×9): 600 mg via ORAL
  Filled 2013-09-17 (×9): qty 1

## 2013-09-17 MED ORDER — LANOLIN HYDROUS EX OINT: TOPICAL_OINTMENT | CUTANEOUS | Status: DC | PRN

## 2013-09-17 MED ORDER — LACTATED RINGERS IV SOLN: 500.0000 mL | Freq: Once | INTRAVENOUS | Status: DC

## 2013-09-17 MED ORDER — ONDANSETRON HCL 4 MG PO TABS: 4.0000 mg | ORAL_TABLET | ORAL | Status: DC | PRN

## 2013-09-17 MED ORDER — MISOPROSTOL 200 MCG PO TABS: 1000.0000 ug | ORAL_TABLET | Freq: Once | ORAL | Status: DC

## 2013-09-17 MED ORDER — FLEET ENEMA 7-19 GM/118ML RE ENEM: 1.0000 | ENEMA | Freq: Every day | RECTAL | Status: DC | PRN

## 2013-09-17 MED ORDER — WITCH HAZEL-GLYCERIN EX PADS: 1.0000 "application " | MEDICATED_PAD | CUTANEOUS | Status: DC | PRN

## 2013-09-17 MED ORDER — MAGNESIUM SULFATE 40 G IN LACTATED RINGERS - SIMPLE
2.0000 g/h | INTRAVENOUS | Status: AC
  Administered 2013-09-17: 2 g/h via INTRAVENOUS
  Filled 2013-09-17 (×2): qty 500

## 2013-09-17 MED ORDER — ONDANSETRON HCL 4 MG/2ML IJ SOLN: 4.0000 mg | INTRAMUSCULAR | Status: DC | PRN

## 2013-09-17 MED ORDER — BENZOCAINE-MENTHOL 20-0.5 % EX AERO: 1.0000 "application " | INHALATION_SPRAY | CUTANEOUS | Status: DC | PRN

## 2013-09-17 MED ORDER — DIBUCAINE 1 % RE OINT: 1.0000 "application " | TOPICAL_OINTMENT | RECTAL | Status: DC | PRN

## 2013-09-17 MED ORDER — LACTATED RINGERS IV SOLN
INTRAVENOUS | Status: DC
  Administered 2013-09-17: via INTRAVENOUS

## 2013-09-17 MED ORDER — SENNOSIDES-DOCUSATE SODIUM 8.6-50 MG PO TABS
2.0000 | ORAL_TABLET | ORAL | Status: DC
  Administered 2013-09-17 – 2013-09-18 (×2): 2 via ORAL
  Filled 2013-09-17 (×2): qty 2

## 2013-09-17 MED ORDER — MEDROXYPROGESTERONE ACETATE 150 MG/ML IM SUSP
150.0000 mg | INTRAMUSCULAR | Status: AC | PRN
  Administered 2013-09-19: 150 mg via INTRAMUSCULAR
  Filled 2013-09-17: qty 1

## 2013-09-17 MED ORDER — BUTORPHANOL TARTRATE 1 MG/ML IJ SOLN
1.0000 mg | Freq: Once | INTRAMUSCULAR | Status: AC
  Administered 2013-09-17: 1 mg via INTRAVENOUS
  Filled 2013-09-17: qty 1

## 2013-09-17 MED ORDER — BISACODYL 10 MG RE SUPP: 10.0000 mg | Freq: Every day | RECTAL | Status: DC | PRN

## 2013-09-17 MED ORDER — MISOPROSTOL 200 MCG PO TABS
1000.0000 ug | ORAL_TABLET | Freq: Once | ORAL | Status: AC
  Administered 2013-09-17: 1000 ug via RECTAL

## 2013-09-17 MED ORDER — MAGNESIUM SULFATE BOLUS VIA INFUSION
4.0000 g | Freq: Once | INTRAVENOUS | Status: AC
  Administered 2013-09-17: 4 g via INTRAVENOUS
  Filled 2013-09-17: qty 500

## 2013-09-17 NOTE — Progress Notes (Signed)
  Subjective: Crying with contractions.   Objective: BP 118/66  Pulse 80  Temp(Src) 97.5 F (36.4 C) (Oral)  Resp 24  Ht 5\' 6"  (1.676 m)  Wt 282 lb (127.914 kg)  BMI 45.54 kg/m2   Total I/O In: 2332.4 [P.O.:1020; I.V.:1312.4] Out: 765 [Urine:765]  Filed Vitals:   09/17/13 0410 09/17/13 0433 09/17/13 0434 09/17/13 0457  BP: 162/102 149/121 159/105 118/66  Pulse: 82 122 86 80  Temp: 97.5 F (36.4 C)     TempSrc: Oral     Resp: 20  24 24   Height:      Weight:      BP at 4:57a likely inaccurate.  Labetalol given: 3:22a--10 mg 4:17a--10 mg  Magnesium sulfate on 2 gm/hr (bolus infused at 12:51a)  FHT:  Category 2--previous cycle of sporadic quick variables, now resolved.  No accels, but negative spontaneous CST. UC:   regular, every 3-4 minutes SVE:   Dilation: 4 Effacement (%): 100 Station: -1 Exam by:: v. Sol Odor, cnm  Assessment / Plan: Induction for IUGR, pre-eclampsia Requiring supplemental IV Labetalol at intervals  Plan: Pain med via IV per patient preference at this point Close observation of maternal BP and FHR. Reiterated recommendation of epidural as labor advances--patient will consider. Repeat PIH labs this am (last labs done at 8:30pm).  Nigel Bridgeman 09/17/2013, 5:27 AM

## 2013-09-17 NOTE — Progress Notes (Signed)
Subjective: Received Stadol at 5:32a with minimal benefit.  Objective: BP 154/131  Pulse 94  Temp(Src) 97.5 F (36.4 C) (Oral)  Resp 22  Ht 5\' 6"  (1.676 m)  Wt 282 lb (127.914 kg)  BMI 45.54 kg/m2   Total I/O In: 2563.5 [P.O.:1080; I.V.:1483.5] Out: 1265 [Urine:1265]  Filed Vitals:   09/17/13 0434 09/17/13 0457 09/17/13 0541 09/17/13 0613  BP: 159/105 118/66 137/85 154/131  Pulse: 86 80 75 94  Temp:      TempSrc:      Resp: 24 24 22 22   Height:      Weight:       Results for orders placed during the hospital encounter of 09/16/13 (from the past 24 hour(s))  COMPREHENSIVE METABOLIC PANEL     Status: Abnormal   Collection Time    09/16/13  8:30 PM      Result Value Range   Sodium 136  135 - 145 mEq/L   Potassium 4.0  3.5 - 5.1 mEq/L   Chloride 103  96 - 112 mEq/L   CO2 22  19 - 32 mEq/L   Glucose, Bld 102 (*) 70 - 99 mg/dL   BUN 9  6 - 23 mg/dL   Creatinine, Ser 1.61  0.50 - 1.10 mg/dL   Calcium 9.1  8.4 - 09.6 mg/dL   Total Protein 5.9 (*) 6.0 - 8.3 g/dL   Albumin 2.6 (*) 3.5 - 5.2 g/dL   AST 10  0 - 37 U/L   ALT 10  0 - 35 U/L   Alkaline Phosphatase 108  39 - 117 U/L   Total Bilirubin 0.3  0.3 - 1.2 mg/dL   GFR calc non Af Amer >90  >90 mL/min   GFR calc Af Amer >90  >90 mL/min  LACTATE DEHYDROGENASE     Status: None   Collection Time    09/16/13  8:30 PM      Result Value Range   LDH 211  94 - 250 U/L  URIC ACID     Status: None   Collection Time    09/16/13  8:30 PM      Result Value Range   Uric Acid, Serum 4.7  2.4 - 7.0 mg/dL  TYPE AND SCREEN     Status: None   Collection Time    09/16/13  8:30 PM      Result Value Range   ABO/RH(D) AB NEG     Antibody Screen POS     Sample Expiration 09/19/2013     Antibody Identification PASSIVELY ACQUIRED ANTI-D     DAT, IgG NEG     Unit Number [id number]     Blood Component Type RED CELLS,LR     Unit division 00     Status of Unit ALLOCATED     Transfusion Status OK TO TRANSFUSE     Crossmatch  Result COMPATIBLE     Unit Number [id number]     Blood Component Type RED CELLS,LR     Unit division 00     Status of Unit ALLOCATED     Transfusion Status OK TO TRANSFUSE     Crossmatch Result COMPATIBLE    CBC     Status: Abnormal   Collection Time    09/16/13  8:30 PM      Result Value Range   WBC 7.5  4.0 - 10.5 K/uL   RBC 4.19  3.87 - 5.11 MIL/uL   Hemoglobin 12.0  12.0 - 15.0 g/dL   HCT 84.6 (*)  36.0 - 46.0 %   MCV 84.5  78.0 - 100.0 fL   MCH 28.6  26.0 - 34.0 pg   MCHC 33.9  30.0 - 36.0 g/dL   RDW 16.1  09.6 - 04.5 %   Platelets 171  150 - 400 K/uL  GLUCOSE, CAPILLARY     Status: None   Collection Time    09/16/13 10:13 PM      Result Value Range   Glucose-Capillary 89  70 - 99 mg/dL   Comment 1 Notify RN    PROTEIN / CREATININE RATIO, URINE     Status: Abnormal   Collection Time    09/16/13 10:25 PM      Result Value Range   Creatinine, Urine 240.38     Total Protein, Urine 316.9     PROTEIN CREATININE RATIO 1.32 (*) 0.00 - 0.15  GLUCOSE, CAPILLARY     Status: None   Collection Time    09/17/13  3:16 AM      Result Value Range   Glucose-Capillary 87  70 - 99 mg/dL   Comment 1 Notify RN    CBC     Status: Abnormal   Collection Time    09/17/13  5:45 AM      Result Value Range   WBC 9.5  4.0 - 10.5 K/uL   RBC 4.07  3.87 - 5.11 MIL/uL   Hemoglobin 11.7 (*) 12.0 - 15.0 g/dL   HCT 40.9 (*) 81.1 - 91.4 %   MCV 83.0  78.0 - 100.0 fL   MCH 28.7  26.0 - 34.0 pg   MCHC 34.6  30.0 - 36.0 g/dL   RDW 78.2  95.6 - 21.3 %   Platelets 166  150 - 400 K/uL  COMPREHENSIVE METABOLIC PANEL     Status: Abnormal   Collection Time    09/17/13  5:45 AM      Result Value Range   Sodium 135  135 - 145 mEq/L   Potassium 3.8  3.5 - 5.1 mEq/L   Chloride 103  96 - 112 mEq/L   CO2 23  19 - 32 mEq/L   Glucose, Bld 110 (*) 70 - 99 mg/dL   BUN 7  6 - 23 mg/dL   Creatinine, Ser 0.86  0.50 - 1.10 mg/dL   Calcium 8.5  8.4 - 57.8 mg/dL   Total Protein 5.9 (*) 6.0 - 8.3 g/dL    Albumin 2.6 (*) 3.5 - 5.2 g/dL   AST 13  0 - 37 U/L   ALT 10  0 - 35 U/L   Alkaline Phosphatase 98  39 - 117 U/L   Total Bilirubin 0.3  0.3 - 1.2 mg/dL   GFR calc non Af Amer >90  >90 mL/min   GFR calc Af Amer >90  >90 mL/min  LACTATE DEHYDROGENASE     Status: Abnormal   Collection Time    09/17/13  5:45 AM      Result Value Range   LDH 281 (*) 94 - 250 U/L  URIC ACID     Status: None   Collection Time    09/17/13  5:45 AM      Result Value Range   Uric Acid, Serum 4.4  2.4 - 7.0 mg/dL    FHT: Category 2--decreased variability after Stadol, but no decels.  Negative CST. UC:   regular, every 3 minutes Pitocin on 15 mu/min  Assessment / Plan: Progressive labor Pre-eclampsia Will continue to observe. Dr. Charlotta Newton updated.  Nigel Bridgeman 09/17/2013, 6:29 AM

## 2013-09-17 NOTE — Progress Notes (Signed)
Patient ID: Lauren [name], female   DOB: [birth year], 22 y.o.   MRN: [id number] Lauren [name] is a 22 y.o. G1P0 at [redacted]w[redacted]d admitted for IOL for IUGR and pre-eclampsia   Subjective: Breathing w ctx, requests IV meds, declines epidural, pt c/o headache when she stands up, has had some nausea, declines zofran, denies RUQ pain   Objective: BP 158/92  Pulse 87  Temp(Src) 97.4 F (36.3 C) (Oral)  Resp 20  Ht 5\' 6"  (1.676 m)  Wt 282 lb (127.914 kg)  BMI 45.54 kg/m2  Filed Vitals:   09/17/13 0651 09/17/13 0732 09/17/13 0814 09/17/13 0856  BP: 149/93 154/93 142/81 158/92  Pulse: 71 82 86 87  Temp:      TempSrc:      Resp: 24 30 18 20   Height:      Weight:         Total I/O In: 313.2 [I.V.:313.2] Out: 400 [Urine:400]  FHT:  Cat 2, mod variability, no accels, mild early variables  UC:   toco 3-4  SVE:   Dilation: 5.5 Effacement (%): 100 Station: -1 Exam by:: S. Brittiney Dicostanzo, CNM  AROM, scant clear fluid,  FSE placed without difficulty +scalp stim    Assessment / Plan:  Labor: Progressing normally and active labor Preeclampsia:  on magnesium sulfate Fetal Wellbeing:  Category II Pain Control:  IV stadol Anticipated MOD:  NSVD  GBS neg  Continue to titrate pitocin   Update physician PRN   Malissa Hippo 09/17/2013, 9:27 AM

## 2013-09-17 NOTE — Progress Notes (Signed)
Subjective: Comfortable--denies pain, HA, visual sx, or epigastric pain.  Objective: BP 160/106  Pulse 88  Temp(Src) 97.5 F (36.4 C) (Oral)  Resp 20  Ht 5\' 6"  (1.676 m)  Wt 282 lb (127.914 kg)  BMI 45.54 kg/m2  Filed Vitals:   09/16/13 2250 09/16/13 2323 09/17/13 0001 09/17/13 0006  BP: 144/87 143/86 174/88 160/106  Pulse: 78 91 87 88  Temp:      TempSrc:      Resp: 20 20 20    Height:      Weight:       Has received Labetalol x 4 since admission: 2100--10 mg 2216--10 mg 2234--20 mg  0012--10 mg    Total I/O In: 927 [P.O.:480; I.V.:447] Out: -   FHT: Category 1 UC:   Occasional, mild Pitocin on 6 mu/min  Results for orders placed during the hospital encounter of 09/16/13 (from the past 24 hour(s))  COMPREHENSIVE METABOLIC PANEL     Status: Abnormal   Collection Time    09/16/13  8:30 PM      Result Value Range   Sodium 136  135 - 145 mEq/L   Potassium 4.0  3.5 - 5.1 mEq/L   Chloride 103  96 - 112 mEq/L   CO2 22  19 - 32 mEq/L   Glucose, Bld 102 (*) 70 - 99 mg/dL   BUN 9  6 - 23 mg/dL   Creatinine, Ser 1.61  0.50 - 1.10 mg/dL   Calcium 9.1  8.4 - 09.6 mg/dL   Total Protein 5.9 (*) 6.0 - 8.3 g/dL   Albumin 2.6 (*) 3.5 - 5.2 g/dL   AST 10  0 - 37 U/L   ALT 10  0 - 35 U/L   Alkaline Phosphatase 108  39 - 117 U/L   Total Bilirubin 0.3  0.3 - 1.2 mg/dL   GFR calc non Af Amer >90  >90 mL/min   GFR calc Af Amer >90  >90 mL/min  LACTATE DEHYDROGENASE     Status: None   Collection Time    09/16/13  8:30 PM      Result Value Range   LDH 211  94 - 250 U/L  URIC ACID     Status: None   Collection Time    09/16/13  8:30 PM      Result Value Range   Uric Acid, Serum 4.7  2.4 - 7.0 mg/dL  TYPE AND SCREEN     Status: None   Collection Time    09/16/13  8:30 PM      Result Value Range   ABO/RH(D) AB NEG     Antibody Screen PENDING     Sample Expiration 09/19/2013    CBC     Status: Abnormal   Collection Time    09/16/13  8:30 PM      Result Value Range    WBC 7.5  4.0 - 10.5 K/uL   RBC 4.19  3.87 - 5.11 MIL/uL   Hemoglobin 12.0  12.0 - 15.0 g/dL   HCT 04.5 (*) 40.9 - 81.1 %   MCV 84.5  78.0 - 100.0 fL   MCH 28.6  26.0 - 34.0 pg   MCHC 33.9  30.0 - 36.0 g/dL   RDW 91.4  78.2 - 95.6 %   Platelets 171  150 - 400 K/uL  GLUCOSE, CAPILLARY     Status: None   Collection Time    09/16/13 10:13 PM      Result Value Range  Glucose-Capillary 89  70 - 99 mg/dL   Comment 1 Notify RN    PROTEIN / CREATININE RATIO, URINE     Status: Abnormal   Collection Time    09/16/13 10:25 PM      Result Value Range   Creatinine, Urine 240.38     Total Protein, Urine 316.9     PROTEIN CREATININE RATIO 1.32 (*) 0.00 - 0.15    Assessment / Plan: Induction of labor for IUGR Pre-eclampsia--newly dx  Plan: Start magnesium sulfate--4 gm bolus, 2 gm/hr Continue pitocin induction--AROM when contractions increase Repeat PIH labs in 6 hours from initial draw. Labetalol prn for BP parameters Reviewed dx and plan of care with patient and family. Dr. Charlotta Newton updated.  Nigel Bridgeman 09/17/2013, 12:36 AM

## 2013-09-18 LAB — CBC
MCH: 28.3 pg (ref 26.0–34.0)
MCHC: 33.3 g/dL (ref 30.0–36.0)
Platelets: 156 10*3/uL (ref 150–400)
RBC: 3.71 MIL/uL — ABNORMAL LOW (ref 3.87–5.11)
WBC: 9.3 10*3/uL (ref 4.0–10.5)

## 2013-09-18 MED ORDER — NIFEDIPINE ER 30 MG PO TB24
30.0000 mg | ORAL_TABLET | Freq: Every day | ORAL | Status: DC
  Administered 2013-09-18 – 2013-09-19 (×2): 30 mg via ORAL
  Filled 2013-09-18 (×3): qty 1

## 2013-09-18 MED ORDER — MEDROXYPROGESTERONE ACETATE 150 MG/ML IM SUSP: 150.0000 mg | Freq: Once | INTRAMUSCULAR | Status: DC

## 2013-09-18 NOTE — Progress Notes (Signed)
Post Partum Day 1:  S/P SVB, induction due to IUGR, pre-eclampsia Subjective: Patient up ad lib, denies syncope or dizziness. Feeding:  Breast Contraceptive plan:   Depo on d/c  Objective: Blood pressure 138/77, pulse 85, temperature 98.2 F (36.8 C), temperature source Oral, resp. rate 18, height 5\' 6"  (1.676 m), weight 266 lb 4.8 oz (120.793 kg), SpO2 100.00%, unknown if currently breastfeeding.  Weight 282 on admission 09/16/13  Filed Vitals:   09/18/13 0600 09/18/13 0709 09/18/13 0833 09/18/13 0902  BP: 149/88 148/103 142/96 138/77  Pulse: 83  91 85  Temp:   98.2 F (36.8 C)   TempSrc:   Oral   Resp:   18   Height:      Weight:      SpO2: 100%  99% 100%   Magnesium sulfate on 2 gm/hr--to be d/c'd at 10 am (completion of 24 hour course).  Physical Exam:  General: alert Lochia: appropriate Uterine Fundus: firm Incision: healing well, 1st degree right labia DVT Evaluation: No evidence of DVT seen on physical exam. Negative Homan's sign. DTR 1+, trace edema noted.  Recent Labs  09/17/13 0545 09/18/13 0500  HGB 11.7* 10.5*  HCT 33.8* 31.5*   24 hour I/O:  +762.1, with 5550 cc output Last shift:  450 cc output  Assessment/Plan: S/P Vaginal delivery day 1--induction for IUGR and pre-eclampsia Some lability of BPs  Plan: Continue current care Complete 24 hours of magnesium at 10am. Will consult Dr. Richardson Dopp prn regarding BPs. Observe in AICU 4 hours after completion of Mag--if BPs stable, will transfer to [facility]. Plan for discharge tomorrow Depo Provera 150 mg IM prior to d/c.   LOS: 2 days   Nigel Bridgeman 09/18/2013, 9:52 AM

## 2013-09-18 NOTE — Progress Notes (Signed)
Called with BP results--now s/p discontinuation of Magnesium at 10 am. Filed Vitals:   09/18/13 1000 09/18/13 1100 09/18/13 1220 09/18/13 1404  BP: 129/71 140/96 146/93 150/99  Pulse: 93  94   Temp:   99.2 F (37.3 C)   TempSrc:   Oral   Resp:   20   Height:      Weight:      SpO2: 98%  100%    Per previous consult with Dr. Richardson Dopp, will start Procardia XL 30 mg daily.  Nigel Bridgeman, CNM 09/18/13 2:15p

## 2013-09-19 MED ORDER — NIFEDIPINE ER 30 MG PO TB24: 30.0000 mg | ORAL_TABLET | Freq: Every day | ORAL | Status: DC

## 2013-09-19 MED ORDER — IBUPROFEN 600 MG PO TABS: 600.0000 mg | ORAL_TABLET | Freq: Four times a day (QID) | ORAL | Status: DC

## 2013-09-19 NOTE — Lactation Note (Addendum)
This note was copied from the chart of Lauren [name]. Lactation Consultation Note    Follow up consult with this mom and baby, now 50 hours post partum. Baby is still under double phototherapy, and now is at 6 % weight loss, weighing 6 - 5.6 oz. Mom had not fed baby in 4 hours, so I assisted mom with latching baby . Mom was using cradle hold, and I showed her cross cradle and explained how this obtains a better, deeper latch, for a newborn. Teaching done on hyperbilrubinemia and the importance of breast feeding, hydration and stooling. I advised mom to attempt feeding every 3 hours, on more frequently with cues, while baby on phototherapy. Teaching done from the baby and Me book on breast feeding, and mom encouraged to keep an accurate feeding log. Mom knows to call for questions/concenrs  Patient Name: Lauren Chamya Hunton [id number] Date: 09/19/2013 Reason for consult: Follow-up assessment   Maternal Data    Feeding Feeding Type: Breast Fed  LATCH Score/Interventions Latch: Grasps breast easily, tongue down, lips flanged, rhythmical sucking.  Audible Swallowing: A few with stimulation Intervention(s): Skin to skin;Hand expression  Type of Nipple: Everted at rest and after stimulation  Comfort (Breast/Nipple): Soft / non-tender     Hold (Positioning): Assistance needed to correctly position infant at breast and maintain latch. Intervention(s): Breastfeeding basics reviewed;Support Pillows;Position options;Skin to skin  LATCH Score: 8  Lactation Tools Discussed/Used     Consult Status Consult Status: Complete Follow-up type: Call as needed    Alfred Levins 09/19/2013, 12:10 PM

## 2013-09-19 NOTE — Progress Notes (Signed)
Post Partum Day 2 Subjective: Reports feeling well.  Ambulating, voiding and tol po liquids and solids without difficulty.  Denies headache, vision chgs, RUQ pain.  Denies weakness or dizziness.  Pos flatus, pos BM.  Breastfeeding without difficulty.  Infant on bilibed. Plans outpt circ.  Desires DepoProvera for contraception.  Reports minimal pain only with breastfeeding which is relieved with Motrin.  Denies perineal pain.  Objective: Blood pressure 125/73, pulse 89, temperature 98.2 F (36.8 C), temperature source Oral, resp. rate 18, height 5\' 6"  (1.676 m), weight 120.793 kg (266 lb 4.8 oz), SpO2 98.00%, unknown if currently breastfeeding. Filed Vitals:   09/18/13 1630 09/18/13 2020 09/19/13 0018 09/19/13 0448  BP: 139/88 140/92 138/88 125/73  Pulse: 89 85 91 89  Temp: 99 F (37.2 C) 98.7 F (37.1 C) 98.4 F (36.9 C) 98.2 F (36.8 C)  TempSrc: Oral  Oral Oral  Resp: 16 18 18 18   Height:      Weight:      SpO2: 98% 99% 99% 98%   Physical Exam:  General: alert, cooperative and no distress Heart:  RRR Lungs:  CTA bilat Abd:  Soft, NT with pos BS x 4 quads Lochia: appropriate, sm rubra Uterine Fundus: firm, NT 1 below umb Incision: Perineum well approximated. DVT Evaluation: No evidence of DVT seen on physical exam. Negative Homan's sign bilat. Tr edema bilat lower extrems DTRs 2+, No clonus   Recent Labs  09/17/13 0545 09/18/13 0500  HGB 11.7* 10.5*  HCT 33.8* 31.5*    Assessment/Plan: Stable s/p vaginal delivery at 39w 4d Pre-eclampsia - s/p magnesium sulfate  Discharge to home. Discharge instructions reviewed. Rx: Motrin, Procardia, PNV. Depo Provera given prior to discharge. Smart Start BP check early this coming week.     LOS: 3 days   Tiernan Millikin O. 09/19/2013, 8:59 AM

## 2013-09-19 NOTE — Discharge Summary (Signed)
Obstetric Discharge Summary Reason for Admission: induction of labor due to IUGR and Preeclampsia Prenatal Procedures: ultrasound Intrapartum Procedures: spontaneous vaginal delivery Postpartum Procedures: Magnesium Sulfate x 24hrs Complications-Operative and Postpartum: 1st degree labia laceration  Results for orders placed during the hospital encounter of 09/16/13 (from the past 72 hour(s))  COMPREHENSIVE METABOLIC PANEL     Status: Abnormal   Collection Time    09/16/13  8:30 PM      Result Value Range   Sodium 136  135 - 145 mEq/L   Potassium 4.0  3.5 - 5.1 mEq/L   Chloride 103  96 - 112 mEq/L   CO2 22  19 - 32 mEq/L   Glucose, Bld 102 (*) 70 - 99 mg/dL   BUN 9  6 - 23 mg/dL   Creatinine, Ser 1.61  0.50 - 1.10 mg/dL   Calcium 9.1  8.4 - 09.6 mg/dL   Total Protein 5.9 (*) 6.0 - 8.3 g/dL   Albumin 2.6 (*) 3.5 - 5.2 g/dL   AST 10  0 - 37 U/L   ALT 10  0 - 35 U/L   Alkaline Phosphatase 108  39 - 117 U/L   Total Bilirubin 0.3  0.3 - 1.2 mg/dL   GFR calc non Af Amer >90  >90 mL/min   GFR calc Af Amer >90  >90 mL/min   Comment: (NOTE)     The eGFR has been calculated using the CKD EPI equation.     This calculation has not been validated in all clinical situations.     eGFR's persistently <90 mL/min signify possible Chronic Kidney     Disease.  LACTATE DEHYDROGENASE     Status: None   Collection Time    09/16/13  8:30 PM      Result Value Range   LDH 211  94 - 250 U/L  URIC ACID     Status: None   Collection Time    09/16/13  8:30 PM      Result Value Range   Uric Acid, Serum 4.7  2.4 - 7.0 mg/dL  TYPE AND SCREEN     Status: None   Collection Time    09/16/13  8:30 PM      Result Value Range   ABO/RH(D) AB NEG     Antibody Screen POS     Sample Expiration 09/19/2013     Antibody Identification PASSIVELY ACQUIRED ANTI-D     DAT, IgG NEG     Unit Number [id number]     Blood Component Type RED CELLS,LR     Unit division 00     Status of Unit ALLOCATED      Transfusion Status OK TO TRANSFUSE     Crossmatch Result COMPATIBLE     Unit Number [id number]     Blood Component Type RED CELLS,LR     Unit division 00     Status of Unit ALLOCATED     Transfusion Status OK TO TRANSFUSE     Crossmatch Result COMPATIBLE    CBC     Status: Abnormal   Collection Time    09/16/13  8:30 PM      Result Value Range   WBC 7.5  4.0 - 10.5 K/uL   RBC 4.19  3.87 - 5.11 MIL/uL   Hemoglobin 12.0  12.0 - 15.0 g/dL   HCT 84.6 (*) 96.2 - 95.2 %   MCV 84.5  78.0 - 100.0 fL   MCH 28.6  26.0 - 34.0 pg  MCHC 33.9  30.0 - 36.0 g/dL   RDW 78.2  95.6 - 21.3 %   Platelets 171  150 - 400 K/uL  GLUCOSE, CAPILLARY     Status: None   Collection Time    09/16/13 10:13 PM      Result Value Range   Glucose-Capillary 89  70 - 99 mg/dL   Comment 1 Notify RN    PROTEIN / CREATININE RATIO, URINE     Status: Abnormal   Collection Time    09/16/13 10:25 PM      Result Value Range   Creatinine, Urine 240.38     Total Protein, Urine 316.9     Comment: NO NORMAL RANGE ESTABLISHED FOR THIS TEST   PROTEIN CREATININE RATIO 1.32 (*) 0.00 - 0.15   Comment: Performed at [facility]  GLUCOSE, CAPILLARY     Status: None   Collection Time    09/17/13  3:16 AM      Result Value Range   Glucose-Capillary 87  70 - 99 mg/dL   Comment 1 Notify RN    CBC     Status: Abnormal   Collection Time    09/17/13  5:45 AM      Result Value Range   WBC 9.5  4.0 - 10.5 K/uL   RBC 4.07  3.87 - 5.11 MIL/uL   Hemoglobin 11.7 (*) 12.0 - 15.0 g/dL   HCT 08.6 (*) 57.8 - 46.9 %   MCV 83.0  78.0 - 100.0 fL   MCH 28.7  26.0 - 34.0 pg   MCHC 34.6  30.0 - 36.0 g/dL   RDW 62.9  52.8 - 41.3 %   Platelets 166  150 - 400 K/uL  COMPREHENSIVE METABOLIC PANEL     Status: Abnormal   Collection Time    09/17/13  5:45 AM      Result Value Range   Sodium 135  135 - 145 mEq/L   Potassium 3.8  3.5 - 5.1 mEq/L   Chloride 103  96 - 112 mEq/L   CO2 23  19 - 32 mEq/L   Glucose, Bld 110 (*) 70 - 99  mg/dL   BUN 7  6 - 23 mg/dL   Creatinine, Ser 2.44  0.50 - 1.10 mg/dL   Calcium 8.5  8.4 - 01.0 mg/dL   Total Protein 5.9 (*) 6.0 - 8.3 g/dL   Albumin 2.6 (*) 3.5 - 5.2 g/dL   AST 13  0 - 37 U/L   ALT 10  0 - 35 U/L   Alkaline Phosphatase 98  39 - 117 U/L   Total Bilirubin 0.3  0.3 - 1.2 mg/dL   GFR calc non Af Amer >90  >90 mL/min   GFR calc Af Amer >90  >90 mL/min   Comment: (NOTE)     The eGFR has been calculated using the CKD EPI equation.     This calculation has not been validated in all clinical situations.     eGFR's persistently <90 mL/min signify possible Chronic Kidney     Disease.  LACTATE DEHYDROGENASE     Status: Abnormal   Collection Time    09/17/13  5:45 AM      Result Value Range   LDH 281 (*) 94 - 250 U/L  URIC ACID     Status: None   Collection Time    09/17/13  5:45 AM      Result Value Range   Uric Acid, Serum 4.4  2.4 -  7.0 mg/dL  GLUCOSE, CAPILLARY     Status: None   Collection Time    09/17/13  6:34 AM      Result Value Range   Glucose-Capillary 95  70 - 99 mg/dL   Comment 1 Notify RN    CBC     Status: Abnormal   Collection Time    09/18/13  5:00 AM      Result Value Range   WBC 9.3  4.0 - 10.5 K/uL   RBC 3.71 (*) 3.87 - 5.11 MIL/uL   Hemoglobin 10.5 (*) 12.0 - 15.0 g/dL   HCT 96.0 (*) 45.4 - 09.8 %   MCV 84.9  78.0 - 100.0 fL   MCH 28.3  26.0 - 34.0 pg   MCHC 33.3  30.0 - 36.0 g/dL   RDW 11.9  14.7 - 82.9 %   Platelets 156  150 - 400 K/uL   Pt admitted for IOL due to IUGR noted on ultrasound as well as elevated BP and proteinuria.  Elevated urine protein creatinine ratio obtained on admission with normal serum PIH labs.  Pt's labor induced with pitocin.  Pt declined epidural placement during labor and was treated with several doses of IV Labetalol as well as Magnesium Sulfate IV during labor as well as 24hrs post delivery.  BPs labile postpartum off magnesium therefore pt was started on Procardia XL 30mg  po daily the day prior to discharge  with improvement in blood pressure noted after initiation of medication.  At time of discharge, pt asymptomatic related to hypertension/preeclampsia.   Physical Exam:   Blood pressure 125/73, pulse 89, temperature 98.2 F (36.8 C), temperature source Oral, resp. rate 18, height 5\' 6"  (1.676 m), weight 120.793 kg (266 lb 4.8 oz), SpO2 98.00%, unknown if currently breastfeeding.  Filed Vitals:    09/18/13 1630  09/18/13 2020  09/19/13 0018  09/19/13 0448   BP:  139/88  140/92  138/88  125/73   Pulse:  89  85  91  89   Temp:  99 F (37.2 C)  98.7 F (37.1 C)  98.4 F (36.9 C)  98.2 F (36.8 C)   TempSrc:  Oral   Oral  Oral   Resp:  16  18  18  18    Height:       Weight:       SpO2:  98%  99%  99%  98%   I/O last 3 completed shifts: In: 3420 [P.O.:2220; I.V.:1200] Out: 4800 [Urine:4800]   Physical Exam:  General: alert, cooperative and no distress  Heart: RRR  Lungs: CTA bilat  Abd: Soft, NT with pos BS x 4 quads  Lochia: appropriate, sm rubra  Uterine Fundus: firm, NT 1 below umb  Incision: Perineum well approximated.  DVT Evaluation: No evidence of DVT seen on physical exam.  Negative Homan's sign bilat.  Tr edema bilat lower extrems  DTRs 2+, No clonus  Discharge Diagnoses: Term Pregnancy-delivered and Preelampsia;                                          Discharge Information: Date: 09/19/2013 Activity: unrestricted Diet: routine Medications: PNV and Ibuprofen Condition: stable Instructions: refer to practice specific booklet Discharge to: home Follow-up Information   Follow up with [facility]. Schedule an appointment as soon as possible for a visit in 6 weeks.   Contact information:  [address] [place] [address]       Newborn Data: Live born female  Birth Weight: 6 lb 12.4 oz (3073 g) APGAR: 2, 9  Infant not discharged at present due to elevated bilirubin.  Ronaldo Crilly O. 09/19/2013, 9:11 AM

## 2013-09-20 ENCOUNTER — Ambulatory Visit: Payer: Self-pay

## 2013-09-20 LAB — TYPE AND SCREEN
Antibody Screen: POSITIVE
Unit division: 0
Unit division: 0

## 2013-09-20 NOTE — Lactation Note (Signed)
This note was copied from the chart of Lauren [name]. Lactation Consultation Note    Follow up consult with this mom and baby, now 71 hours post partum. Baby has lots of wet and dirty diapers, and mom reports breast feeding going well.  When I walked in the room, mom had her back to the baby, eating breakfast, and the baby was sleeping in her bed, on his side. I reviewed with mom how this was a SIDS risk, and since she could not see her baby , he needed to be in his crib , on his back. When I asked mom if she wanted me to put him back in his crib, she picked him up.  Patient Name: Lauren Lumina Gitto [id number] Date: 09/20/2013 Reason for consult: Follow-up assessment   Maternal Data    Feeding Feeding Type: Breast Fed Length of feed: 15 min  LATCH Score/Interventions Latch: Grasps breast easily, tongue down, lips flanged, rhythmical sucking.  Audible Swallowing: A few with stimulation  Type of Nipple: Everted at rest and after stimulation  Comfort (Breast/Nipple): Soft / non-tender     Hold (Positioning): No assistance needed to correctly position infant at breast.  LATCH Score: 9  Lactation Tools Discussed/Used     Consult Status Consult Status: Complete Follow-up type: Call as needed    Alfred Levins 09/20/2013, 9:33 AM

## 2013-09-20 NOTE — Lactation Note (Signed)
This note was copied from the chart of Lauren [name]. Lactation Consultation Note Follow up visit with baby patient at 82 hours after delivery.  Mom has concerns about pumping.  She reports breastfeeding is going well, baby eats a lot and often, but no stool today. Baby has not stooled in almost 48 hours, but has had 5 voids today.   Mom has leaking colostrum and no formula supplementation.  Encourage mom to continue to latch baby and discussed future needs for pumping after milk is established.   Discussed at length with mom and FOB about benefits of breastfeeding. Baby continues double photo therapy.  Mom to call for assist as needed.  Patient Name: Lauren Ceara Wrightson [id number] Date: 09/20/2013     Maternal Data    Feeding Feeding Type: Breast Fed Length of feed: 10 min  LATCH Score/Interventions                      Lactation Tools Discussed/Used     Consult Status      Sukhraj Esquivias, Arvella Merles 09/20/2013, 9:28 PM

## 2013-10-17 ENCOUNTER — Encounter (HOSPITAL_COMMUNITY): Payer: Self-pay | Admitting: *Deleted

## 2013-10-17 ENCOUNTER — Inpatient Hospital Stay (HOSPITAL_COMMUNITY)
Admission: AD | Admit: 2013-10-17 | Discharge: 2013-10-17 | Disposition: A | Discharge status: Home / Self Care | Payer: Medicaid Other | Source: Ambulatory Visit | Attending: Obstetrics and Gynecology | Admitting: Obstetrics and Gynecology

## 2013-10-17 DIAGNOSIS — R112 Nausea with vomiting, unspecified: Secondary | ICD-10-CM | POA: Insufficient documentation

## 2013-10-17 DIAGNOSIS — M545 Low back pain, unspecified: Secondary | ICD-10-CM | POA: Insufficient documentation

## 2013-10-17 DIAGNOSIS — O99893 Other specified diseases and conditions complicating puerperium: Secondary | ICD-10-CM | POA: Insufficient documentation

## 2013-10-17 DIAGNOSIS — R350 Frequency of micturition: Secondary | ICD-10-CM | POA: Insufficient documentation

## 2013-10-17 DIAGNOSIS — R197 Diarrhea, unspecified: Secondary | ICD-10-CM | POA: Insufficient documentation

## 2013-10-17 DIAGNOSIS — R109 Unspecified abdominal pain: Secondary | ICD-10-CM | POA: Insufficient documentation

## 2013-10-17 LAB — URINALYSIS, ROUTINE W REFLEX MICROSCOPIC
Bilirubin Urine: NEGATIVE
Ketones, ur: NEGATIVE mg/dL
Nitrite: NEGATIVE
Protein, ur: NEGATIVE mg/dL
Urobilinogen, UA: 0.2 mg/dL (ref 0.0–1.0)
pH: 6 (ref 5.0–8.0)

## 2013-10-17 LAB — LIPASE, BLOOD: Lipase: 29 U/L (ref 11–59)

## 2013-10-17 LAB — COMPREHENSIVE METABOLIC PANEL
ALT: 11 U/L (ref 0–35)
Alkaline Phosphatase: 53 U/L (ref 39–117)
BUN: 13 mg/dL (ref 6–23)
CO2: 20 mEq/L (ref 19–32)
Chloride: 103 mEq/L (ref 96–112)
Creatinine, Ser: 0.67 mg/dL (ref 0.50–1.10)
GFR calc Af Amer: >90 mL/min (ref >90)
GFR calc non Af Amer: >90 mL/min (ref >90)
Glucose, Bld: 108 mg/dL — ABNORMAL HIGH (ref 70–99)
Potassium: 4.6 mEq/L (ref 3.5–5.1)
Sodium: 137 mEq/L (ref 135–145)
Total Bilirubin: 0.3 mg/dL (ref 0.3–1.2)
Total Protein: 7.5 g/dL (ref 6.0–8.3)

## 2013-10-17 LAB — URINE MICROSCOPIC-ADD ON

## 2013-10-17 LAB — AMYLASE: Amylase: 40 U/L (ref 0–105)

## 2013-10-17 MED ORDER — ONDANSETRON HCL 4 MG/2ML IJ SOLN
4.0000 mg | Freq: Once | INTRAMUSCULAR | Status: AC
  Administered 2013-10-17: 4 mg via INTRAVENOUS
  Filled 2013-10-17: qty 2

## 2013-10-17 MED ORDER — LACTATED RINGERS IV SOLN
INTRAVENOUS | Status: DC
  Administered 2013-10-17: 04:00:00 via INTRAVENOUS

## 2013-10-17 MED ORDER — LACTATED RINGERS IV BOLUS (SEPSIS)
500.0000 mL | Freq: Once | INTRAVENOUS | Status: AC
  Administered 2013-10-17: 500 mL via INTRAVENOUS

## 2013-10-17 NOTE — MAU Note (Signed)
SVD 09/17/13. Thurs night started having pain in lower abd. Also had vomiting. Diarrhea started 2 hours ago and now having back pain as well.

## 2013-10-17 NOTE — MAU Provider Note (Signed)
History   22yo, G1P1001 at [facility] day # 30 presents with c/o N/V/D as well as mid upper abdominal pain since late Friday night (just over 24 hours ago).  Pt also reports lower back pain that started on her way to MAU, as well as urinary frequency but denies dysuria.  Reports no others sick in family.  Denies fever or URI sx.  Reports appropriate lochia.    Chief Complaint  Patient presents with  . Back Pain  . Abdominal Pain   HPI  OB History   Grav Para Term Preterm Abortions TAB SAB Ect Mult Living   1 1 1       1       Past Medical History  Diagnosis Date  . Asthma   . Allergy     allergic rhinitis  . Gestational diabetes     Past Surgical History  Procedure Laterality Date  . No past surgeries      Family History  Problem Relation Age of Onset  . Hypertension Mother   . Diabetes Father   . Cancer Other   . Asthma Other     History  Substance Use Topics  . Smoking status: Never Smoker   . Smokeless tobacco: Not on file  . Alcohol Use: No    Allergies:  Allergies  Allergen Reactions  . Bee Venom Swelling  . Other Hives and Rash    zaxby's zax sauce.    Prescriptions prior to admission  Medication Sig Dispense Refill  . ibuprofen (ADVIL,MOTRIN) 600 MG tablet Take 1 tablet (600 mg total) by mouth every 6 (six) hours.  30 tablet  1  . NIFEdipine (PROCARDIA-XL/ADALAT CC) 30 MG 24 hr tablet Take 1 tablet (30 mg total) by mouth daily.  30 tablet  1  . Prenatal Vit-Fe Fumarate-FA (PRENATAL MULTIVITAMIN) TABS Take 2 tablets by mouth daily at 12 noon.         ROS ROS: see HPI above, all other systems are negative  Physical Exam   Blood pressure 117/96, pulse 95, temperature 98.6 F (37 C), resp. rate 20, height 5\' 6"  (1.676 m), weight 248 lb 9.6 oz (112.764 kg), currently breastfeeding.  Physical Exam  Constitutional: She appears well-developed and well-nourished.  Neck: Normal range of motion.  Cardiovascular: Normal rate and regular rhythm.   Respiratory:  Effort normal.  GI: Soft. She exhibits no distension and no mass. There is no rebound, no guarding and no CVA tenderness.  C/o pain in mid upper abdomin without guarding on palpation.  Genitourinary:  Uterus not palpable  Musculoskeletal: Normal range of motion.    ED Course  PP day # 30 N/V/D  ?UTI  LR bolus Zofran 4 mg Once  CMET Amylase and Lipase UA Urine culture - pending   Pt reports relief with Zofran  Reported off to Chip Boer for further care  Haroldine Laws CNM, MSN 10/17/2013 2:51 AM

## 2013-10-17 NOTE — MAU Provider Note (Signed)
Patient feeling much better after 1 bag IVF and Zofran. No N/V or diarrhea since arrival. Tolerating po fluids without difficulty. Filed Vitals:   10/17/13 0131  BP: 117/96  Pulse: 95  Temp: 98.6 F (37 C)  Resp: 20  Height: 5\' 6"  (1.676 m)  Weight: 248 lb 9.6 oz (112.764 kg)    Results for orders placed during the hospital encounter of 10/17/13 (from the past 24 hour(s))  URINALYSIS, ROUTINE W REFLEX MICROSCOPIC     Status: Abnormal   Collection Time    10/17/13  1:59 AM      Result Value Range   Color, Urine YELLOW  YELLOW   APPearance CLEAR  CLEAR   Specific Gravity, Urine >1.030 (*) 1.005 - 1.030   pH 6.0  5.0 - 8.0   Glucose, UA NEGATIVE  NEGATIVE mg/dL   Hgb urine dipstick LARGE (*) NEGATIVE   Bilirubin Urine NEGATIVE  NEGATIVE   Ketones, ur NEGATIVE  NEGATIVE mg/dL   Protein, ur NEGATIVE  NEGATIVE mg/dL   Urobilinogen, UA 0.2  0.0 - 1.0 mg/dL   Nitrite NEGATIVE  NEGATIVE   Leukocytes, UA SMALL (*) NEGATIVE  URINE MICROSCOPIC-ADD ON     Status: Abnormal   Collection Time    10/17/13  1:59 AM      Result Value Range   Squamous Epithelial / LPF FEW (*) RARE   WBC, UA 3-6  <3 WBC/hpf   RBC / HPF 7-10  <3 RBC/hpf   Bacteria, UA FEW (*) RARE  COMPREHENSIVE METABOLIC PANEL     Status: Abnormal   Collection Time    10/17/13  3:15 AM      Result Value Range   Sodium 137  135 - 145 mEq/L   Potassium 4.6  3.5 - 5.1 mEq/L   Chloride 103  96 - 112 mEq/L   CO2 20  19 - 32 mEq/L   Glucose, Bld 108 (*) 70 - 99 mg/dL   BUN 13  6 - 23 mg/dL   Creatinine, Ser 9.60  0.50 - 1.10 mg/dL   Calcium 9.5  8.4 - 45.4 mg/dL   Total Protein 7.5  6.0 - 8.3 g/dL   Albumin 4.1  3.5 - 5.2 g/dL   AST 14  0 - 37 U/L   ALT 11  0 - 35 U/L   Alkaline Phosphatase 53  39 - 117 U/L   Total Bilirubin 0.3  0.3 - 1.2 mg/dL   GFR calc non Af Amer >90  >90 mL/min   GFR calc Af Amer >90  >90 mL/min  AMYLASE     Status: None   Collection Time    10/17/13  3:15 AM      Result Value Range   Amylase 40  0 - 105 U/L  LIPASE, BLOOD     Status: None   Collection Time    10/17/13  3:15 AM      Result Value Range   Lipase 29  11 - 59 U/L   No CBC done on admission, but patient not febrile and has no abdominal pain at present.  Will defer due to current status.  D/C home. Viral gastroenteritis d/c instructions given. Has Zofran at home for use as needed. To f/u with [facility] with any worsening of sx.  Nigel Bridgeman, CNM 10/17/13 7:30am.

## 2013-10-17 NOTE — Progress Notes (Signed)
Haroldine Laws CNM notified of pt's admission and status. Aware of u/a results. Will see pt and place orders

## 2013-10-18 LAB — URINE CULTURE: Colony Count: 35000

## 2013-12-13 ENCOUNTER — Emergency Department (HOSPITAL_COMMUNITY)
Admission: EM | Admit: 2013-12-13 | Discharge: 2013-12-13 | Disposition: A | Discharge status: Home / Self Care | Payer: Medicaid Other | Attending: Emergency Medicine | Admitting: Emergency Medicine

## 2013-12-13 ENCOUNTER — Encounter (HOSPITAL_COMMUNITY): Payer: Self-pay | Admitting: Emergency Medicine

## 2013-12-13 DIAGNOSIS — R059 Cough, unspecified: Secondary | ICD-10-CM

## 2013-12-13 DIAGNOSIS — Z79899 Other long term (current) drug therapy: Secondary | ICD-10-CM | POA: Insufficient documentation

## 2013-12-13 DIAGNOSIS — J45909 Unspecified asthma, uncomplicated: Secondary | ICD-10-CM | POA: Insufficient documentation

## 2013-12-13 DIAGNOSIS — J029 Acute pharyngitis, unspecified: Secondary | ICD-10-CM

## 2013-12-13 DIAGNOSIS — R05 Cough: Secondary | ICD-10-CM | POA: Insufficient documentation

## 2013-12-13 DIAGNOSIS — Z8632 Personal history of gestational diabetes: Secondary | ICD-10-CM | POA: Insufficient documentation

## 2013-12-13 DIAGNOSIS — E669 Obesity, unspecified: Secondary | ICD-10-CM | POA: Insufficient documentation

## 2013-12-13 HISTORY — DX: Obesity, unspecified: E66.9

## 2013-12-13 MED ORDER — GUAIFENESIN-CODEINE 100-10 MG/5ML PO SOLN: 5.0000 mL | Freq: Three times a day (TID) | ORAL | Status: DC | PRN

## 2013-12-13 NOTE — Discharge Instructions (Signed)
Sore Throat °A sore throat is pain, burning, irritation, or scratchiness of the throat. There is often pain or tenderness when swallowing or talking. A sore throat may be accompanied by other symptoms, such as coughing, sneezing, fever, and swollen neck glands. A sore throat is often the first sign of another sickness, such as a cold, flu, strep throat, or mononucleosis (commonly known as mono). Most sore throats go away without medical treatment. °CAUSES  °The most common causes of a sore throat include: °· A viral infection, such as a cold, flu, or mono. °· A bacterial infection, such as strep throat, tonsillitis, or whooping cough. °· Seasonal allergies. °· Dryness in the air. °· Irritants, such as smoke or pollution. °· Gastroesophageal reflux disease (GERD). °HOME CARE INSTRUCTIONS  °· Only take over-the-counter medicines as directed by your caregiver. °· Drink enough fluids to keep your urine clear or pale yellow. °· Rest as needed. °· Try using throat sprays, lozenges, or sucking on hard candy to ease any pain (if older than 4 years or as directed). °· Sip warm liquids, such as broth, herbal tea, or warm water with honey to relieve pain temporarily. You may also eat or drink cold or frozen liquids such as frozen ice pops. °· Gargle with salt water (mix 1 tsp salt with 8 oz of water). °· Do not smoke and avoid secondhand smoke. °· Put a cool-mist humidifier in your bedroom at night to moisten the air. You can also turn on a hot shower and sit in the bathroom with the door closed for 5 10 minutes. °SEEK IMMEDIATE MEDICAL CARE IF: °· You have difficulty breathing. °· You are unable to swallow fluids, soft foods, or your saliva. °· You have increased swelling in the throat. °· Your sore throat does not get better in 7 days. °· You have nausea and vomiting. °· You have a fever or persistent symptoms for more than 2 3 days. °· You have a fever and your symptoms suddenly get worse. °MAKE SURE YOU:  °· Understand  these instructions. °· Will watch your condition. °· Will get help right away if you are not doing well or get worse. °Document Released: 11/15/2004 Document Revised: 09/24/2012 Document Reviewed: 06/15/2012 °ExitCare® Patient Information ©2014 [employer]. °Upper Respiratory Infection, Adult °An upper respiratory infection (URI) is also known as the common cold. It is often caused by a type of germ (virus). Colds are easily spread (contagious). You can pass it to others by kissing, coughing, sneezing, or drinking out of the same glass. Usually, you get better in 1 or 2 weeks.  °HOME CARE  °· Only take medicine as told by your doctor. °· Use a warm mist humidifier or breathe in steam from a hot shower. °· Drink enough water and fluids to keep your pee (urine) clear or pale yellow. °· Get plenty of rest. °· Return to work when your temperature is back to normal or as told by your doctor. You may use a face mask and wash your hands to stop your cold from spreading. °GET HELP RIGHT AWAY IF:  °· After the first few days, you feel you are getting worse. °· You have questions about your medicine. °· You have chills, shortness of breath, or brown or red spit (mucus). °· You have yellow or brown snot (nasal discharge) or pain in the face, especially when you bend forward. °· You have a fever, puffy (swollen) neck, pain when you swallow, or white spots in the back of   your throat. °· You have a bad headache, ear pain, sinus pain, or chest pain. °· You have a high-pitched whistling sound when you breathe in and out (wheezing). °· You have a lasting cough or cough up blood. °· You have sore muscles or a stiff neck. °MAKE SURE YOU:  °· Understand these instructions. °· Will watch your condition. °· Will get help right away if you are not doing well or get worse. °Document Released: 03/26/2008 Document Revised: 12/31/2011 Document Reviewed: 02/12/2011 °ExitCare® Patient Information ©2014 [employer]. ° °

## 2013-12-13 NOTE — ED Provider Notes (Signed)
CSN: [id number]     Arrival date & time 12/13/13  0106 History   First MD Initiated Contact with Patient 12/13/13 0112     Chief Complaint  Patient presents with  . Sore Throat     (Consider location/radiation/quality/duration/timing/severity/associated sxs/prior Treatment) HPI Comments: Patient presents emergency department with chief complaint of sore throat and cough. She states symptoms have been ongoing for the past week. She denies any fevers, chills, nausea, or vomiting. She states that the symptoms have been persistent and unchanged. Nothing that she takes alleviate her symptoms. She says that she is tired of a sore throat, and requests something for the pain.  The history is provided by the patient. No language interpreter was used.    Past Medical History  Diagnosis Date  . Asthma   . Allergy     allergic rhinitis  . Gestational diabetes   . Obesity    Past Surgical History  Procedure Laterality Date  . No past surgeries     Family History  Problem Relation Age of Onset  . Hypertension Mother   . Diabetes Father   . Cancer Other   . Asthma Other    History  Substance Use Topics  . Smoking status: Never Smoker   . Smokeless tobacco: Not on file  . Alcohol Use: No   OB History   Grav Para Term Preterm Abortions TAB SAB Ect Mult Living   1 1 1       1      Review of Systems    Allergies  Bee venom and Other  Home Medications   Current Outpatient Rx  Name  Route  Sig  Dispense  Refill  . guaiFENesin-codeine 100-10 MG/5ML syrup   Oral   Take 5 mLs by mouth 3 (three) times daily as needed for cough.   120 mL   0   . ibuprofen (ADVIL,MOTRIN) 600 MG tablet   Oral   Take 1 tablet (600 mg total) by mouth every 6 (six) hours.   30 tablet   1   . NIFEdipine (PROCARDIA-XL/ADALAT CC) 30 MG 24 hr tablet   Oral   Take 1 tablet (30 mg total) by mouth daily.   30 tablet   1   . Prenatal Vit-Fe Fumarate-FA (PRENATAL MULTIVITAMIN) TABS   Oral   Take 2  tablets by mouth daily at 12 noon.           BP 123/86  Pulse 96  Temp(Src) 98.1 F (36.7 C) (Oral)  Resp 18  Wt 258 lb 6.4 oz (117.209 kg)  SpO2 97%  LMP 12/13/2013 Physical Exam  Nursing note and vitals reviewed. Constitutional: She is oriented to person, place, and time. She appears well-developed and well-nourished.  HENT:  Head: Normocephalic and atraumatic.  Oropharynx is mildly erythematous, no tonsillar exudates, no abscess, uvula is midline, airway is intact  Eyes: Conjunctivae and EOM are normal.  Neck: Normal range of motion.  Cardiovascular: Normal rate, regular rhythm and normal heart sounds.   Pulmonary/Chest: Effort normal and breath sounds normal. No respiratory distress. She has no wheezes. She has no rales. She exhibits no tenderness.  Clear to auscultation bilaterally  Abdominal: She exhibits no distension.  Musculoskeletal: Normal range of motion.  Neurological: She is alert and oriented to person, place, and time.  Skin: Skin is dry.  Psychiatric: She has a normal mood and affect. Her behavior is normal. Judgment and thought content normal.    ED Course  Procedures (including critical  care time) Labs Review Labs Reviewed - No data to display Imaging Review No results found.  EKG Interpretation   None       MDM   Final diagnoses:  Sore throat  Cough    Patient a sore throat and cough. I suspect that this is URI. She has no fever. No signs of peritonsillar or tonsillar abscess, airway is intact, no wheezes or rales. I will treat her with Robitussin with codeine. Discharged home. Conservative treatment is indicated. Patient is stable and ready for discharge.    Roxy Horsemanobert Yazir Koerber, PA-C 12/13/13 [phone]

## 2013-12-13 NOTE — ED Notes (Signed)
Pt. reports sore throat onset last week with occasional productive cough , denies fever , airway intact / respirations unlabored .

## 2013-12-13 NOTE — ED Notes (Signed)
Pt c/o sore throat X 1 weeks, sts she tried OTC meds and gargled salt water, this would help for a few hours then the pain would come back. C/o productive cough with some yellow mucus. Denies fever at home. Pt reports she had nasal drainage but that has not gone away. Nad, skin warm and dry, resp e/u.

## 2013-12-14 NOTE — ED Provider Notes (Signed)
Medical screening examination/treatment/procedure(s) were performed by non-physician practitioner and as supervising physician I was immediately available for consultation/collaboration.  EKG Interpretation   None         Enid SkeensJoshua M Daryn Pisani, MD 12/14/13 [phone]

## 2014-07-09 ENCOUNTER — Inpatient Hospital Stay (HOSPITAL_COMMUNITY)
Admission: AD | Admit: 2014-07-09 | Discharge: 2014-07-09 | Disposition: A | Discharge status: Home / Self Care | Payer: Medicaid Other | Source: Ambulatory Visit | Attending: Obstetrics and Gynecology | Admitting: Obstetrics and Gynecology

## 2014-07-09 ENCOUNTER — Encounter (HOSPITAL_COMMUNITY): Payer: Self-pay | Admitting: *Deleted

## 2014-07-09 DIAGNOSIS — B373 Candidiasis of vulva and vagina: Secondary | ICD-10-CM

## 2014-07-09 DIAGNOSIS — R109 Unspecified abdominal pain: Secondary | ICD-10-CM | POA: Insufficient documentation

## 2014-07-09 DIAGNOSIS — B3731 Acute candidiasis of vulva and vagina: Secondary | ICD-10-CM

## 2014-07-09 LAB — URINALYSIS, ROUTINE W REFLEX MICROSCOPIC
Bilirubin Urine: NEGATIVE
Glucose, UA: NEGATIVE mg/dL
Hgb urine dipstick: NEGATIVE
KETONES UR: NEGATIVE mg/dL
Leukocytes, UA: NEGATIVE
NITRITE: NEGATIVE
PH: 7 (ref 5.0–8.0)
Protein, ur: NEGATIVE mg/dL
SPECIFIC GRAVITY, URINE: 1.02 (ref 1.005–1.030)
Urobilinogen, UA: 0.2 mg/dL (ref 0.0–1.0)

## 2014-07-09 LAB — WET PREP, GENITAL
Clue Cells Wet Prep HPF POC: NONE SEEN
Trich, Wet Prep: NONE SEEN
Yeast Wet Prep HPF POC: NONE SEEN

## 2014-07-09 LAB — POCT PREGNANCY, URINE: PREG TEST UR: NEGATIVE

## 2014-07-09 MED ORDER — FLUCONAZOLE 150 MG PO TABS: 150.0000 mg | ORAL_TABLET | Freq: Every day | ORAL | Status: DC

## 2014-07-09 NOTE — Discharge Instructions (Signed)
Abdominal Pain Many things can cause belly (abdominal) pain. Most times, the belly pain is not dangerous. Many cases of belly pain can be watched and treated at home. HOME CARE   Do not take medicines that help you go poop (laxatives) unless told to by your doctor.  Only take medicine as told by your doctor.  Eat or drink as told by your doctor. Your doctor will tell you if you should be on a special diet. GET HELP IF:  You do not know what is causing your belly pain.  You have belly pain while you are sick to your stomach (nauseous) or have runny poop (diarrhea).  You have pain while you pee or poop.  Your belly pain wakes you up at night.  You have belly pain that gets worse or better when you eat.  You have belly pain that gets worse when you eat fatty foods.  You have a fever. GET HELP RIGHT AWAY IF:   The pain does not go away within 2 hours.  You keep throwing up (vomiting).  The pain changes and is only in the right or left part of the belly.  You have bloody or tarry looking poop. MAKE SURE YOU:   Understand these instructions.  Will watch your condition.  Will get help right away if you are not doing well or get worse. Document Released: 03/26/2008 Document Revised: 10/13/2013 Document Reviewed: 06/17/2013 [facility] Patient Information 2015 [place], [place]. This information is not intended to replace advice given to you by your health care provider. Make sure you discuss any questions you have with your health care provider.  Candida Infection A Candida infection (also called yeast, fungus, and Monilia infection) is an overgrowth of yeast that can occur anywhere on the body. A yeast infection commonly occurs in warm, moist body areas. Usually, the infection remains localized but can spread to become a systemic infection. A yeast infection may be a sign of a more severe disease such as diabetes, leukemia, or AIDS. A yeast infection can occur in both men and women.  In women, Candida vaginitis is a vaginal infection. It is one of the most common causes of vaginitis. Men usually do not have symptoms or know they have an infection until other problems develop. Men may find out they have a yeast infection because their sex partner has a yeast infection. Uncircumcised men are more likely to get a yeast infection than circumcised men. This is because the uncircumcised glans is not exposed to air and does not remain as dry as that of a circumcised glans. Older adults may develop yeast infections around dentures. CAUSES  Women  Antibiotics.  Steroid medication taken for a long time.  Being overweight (obese).  Diabetes.  Poor immune condition.  Certain serious medical conditions.  Immune suppressive medications for organ transplant patients.  Chemotherapy.  Pregnancy.  Menstruation.  Stress and fatigue.  Intravenous drug use.  Oral contraceptives.  Wearing tight-fitting clothes in the crotch area.  Catching it from a sex partner who has a yeast infection.  Spermicide.  Intravenous, urinary, or other catheters. Men  Catching it from a sex partner who has a yeast infection.  Having oral or anal sex with a person who has the infection.  Spermicide.  Diabetes.  Antibiotics.  Poor immune system.  Medications that suppress the immune system.  Intravenous drug use.  Intravenous, urinary, or other catheters. SYMPTOMS  Women  Thick, white vaginal discharge.  Vaginal itching.  Redness and swelling  in and around the vagina.  Irritation of the lips of the vagina and perineum.  Blisters on the vaginal lips and perineum.  Painful sexual intercourse.  Low blood sugar (hypoglycemia).  Painful urination.  Bladder infections.  Intestinal problems such as constipation, indigestion, bad breath, bloating, increase in gas, diarrhea, or loose stools. Men  Men may develop intestinal problems such as constipation, indigestion, bad  breath, bloating, increase in gas, diarrhea, or loose stools.  Dry, cracked skin on the penis with itching or discomfort.  Jock itch.  Dry, flaky skin.  Athlete's foot.  Hypoglycemia. DIAGNOSIS  Women  A history and an exam are performed.  The discharge may be examined under a microscope.  A culture may be taken of the discharge. Men  A history and an exam are performed.  Any discharge from the penis or areas of cracked skin will be looked at under the microscope and cultured.  Stool samples may be cultured. TREATMENT  Women  Vaginal antifungal suppositories and creams.  Medicated creams to decrease irritation and itching on the outside of the vagina.  Warm compresses to the perineal area to decrease swelling and discomfort.  Oral antifungal medications.  Medicated vaginal suppositories or cream for repeated or recurrent infections.  Wash and dry the irritation areas before applying the cream.  Eating yogurt with Lactobacillus may help with prevention and treatment.  Sometimes painting the vagina with gentian violet solution may help if creams and suppositories do not work. Men  Antifungal creams and oral antifungal medications.  Sometimes treatment must continue for 30 days after the symptoms go away to prevent recurrence. HOME CARE INSTRUCTIONS  Women  Use cotton underwear and avoid tight-fitting clothing.  Avoid colored, scented toilet paper and deodorant tampons or pads.  Do not douche.  Keep your diabetes under control.  Finish all the prescribed medications.  Keep your skin clean and dry.  Consume milk or yogurt with Lactobacillus-active culture regularly. If you get frequent yeast infections and think that is what the infection is, there are over-the-counter medications that you can get. If the infection does not show healing in 3 days, talk to your caregiver.  Tell your sex partner you have a yeast infection. Your partner may need treatment  also, especially if your infection does not clear up or recurs. Men  Keep your skin clean and dry.  Keep your diabetes under control.  Finish all prescribed medications.  Tell your sex partner that you have a yeast infection so he or she can be treated if necessary. SEEK MEDICAL CARE IF:   Your symptoms do not clear up or worsen in one week after treatment.  You have an oral temperature above 102 F (38.9 C).  You have trouble swallowing or eating for a prolonged time.  You develop blisters on and around your vagina.  You develop vaginal bleeding and it is not your menstrual period.  You develop abdominal pain.  You develop intestinal problems as mentioned above.  You get weak or light-headed.  You have painful or increased urination.  You have pain during sexual intercourse. MAKE SURE YOU:   Understand these instructions.  Will watch your condition.  Will get help right away if you are not doing well or get worse. Document Released: 11/15/2004 Document Revised: 02/22/2014 Document Reviewed: 02/27/2010 [facility] Patient Information 2015 [place], [place]. This information is not intended to replace advice given to you by your health care provider. Make sure you discuss any questions you have with your  health care provider. ° °

## 2014-07-09 NOTE — MAU Provider Note (Signed)
History     CSN: [id number]  Arrival date and time: 07/09/14 1523   First Provider Initiated Contact with Patient 07/09/14 1712      Chief Complaint  Patient presents with  . Abdominal Pain  . Dysuria   HPI Lauren [name] 23 y.o. G1P1001 nonpregnant female presents to MAU complaining of menstrual cramps that began at 3am today.  When she got up and went to sit on the toilet, the pain moved from her pelvic area to her butt and down her legs.  The pain was so bad she could hardly move.  She took  ibuprofen around 7am and laid down with warm compress.  This helped her to be comfortable enough to sleep.  She woke up at 9am with continued cramp.   She thinks it feels most similar to gas pains.   Presently it is 3/10 but at it's worst it is 10/10.  When she pees or poops, the pain becomes terribly and is very sharp.  She has increased urinary frequency for months.  LMP was 9/2-06/27/14.  No vaginal bleeding since that time.  She has regular menses once monthly and does not use contraception.  She started OTC yeast medication 4 days ago.  She has continued to have itching despite this medication.     OB History   Grav Para Term Preterm Abortions TAB SAB Ect Mult Living   Past Medical History  Diagnosis Date  . Asthma   . Allergy     allergic rhinitis  . Gestational diabetes   . Obesity     Past Surgical History  Procedure Laterality Date  . No past surgeries      Family History  Problem Relation Age of Onset  . Hypertension Mother   . Diabetes Father   . Cancer Other   . Asthma Other     History  Substance Use Topics  . Smoking status: Never Smoker   . Smokeless tobacco: Not on file  . Alcohol Use: No    Allergies:  Allergies  Allergen Reactions  . Bee Venom Swelling  . Other Hives and Rash    zaxby's zax sauce.    Prescriptions prior to admission  Medication Sig Dispense Refill  . ibuprofen (ADVIL,MOTRIN) 600 MG tablet Take 600 mg by  mouth every 6 (six) hours as needed for headache or cramping.        Review of Systems  Constitutional: Positive for chills. Negative for fever and diaphoresis.  HENT: Negative for congestion and sore throat.   Eyes: Negative for blurred vision and double vision.  Respiratory: Negative for cough, shortness of breath and wheezing.   Cardiovascular: Negative for chest pain and palpitations.  Gastrointestinal: Positive for abdominal pain. Negative for heartburn, nausea, vomiting, diarrhea and constipation.  Genitourinary: Positive for dysuria, urgency, frequency and flank pain. Negative for hematuria.  Musculoskeletal: Positive for back pain.  Skin: Positive for itching. Negative for rash.  Neurological: Positive for dizziness and headaches. Negative for tingling and weakness.  Psychiatric/Behavioral: Negative for depression, suicidal ideas and substance abuse. The patient is not nervous/anxious.    Physical Exam   Blood pressure 134/77, pulse 80, temperature 99.5 F (37.5 C), temperature source Oral, resp. rate 18, height 5' 5.5" (1.664 m), weight 261 lb 6.4 oz (118.57 kg), last menstrual period 06/27/2014, SpO2 99.00%, currently breastfeeding.  Physical Exam  Constitutional: She is oriented to person, place, and  time. She appears well-developed and well-nourished. No distress.  HENT:  Head: Normocephalic and atraumatic.  Eyes: EOM are normal.  Neck: Normal range of motion.  Cardiovascular: Normal rate, regular rhythm and normal heart sounds.   Respiratory: Effort normal and breath sounds normal. No respiratory distress.  GI: Soft. Bowel sounds are normal. She exhibits no distension.  Large pannus. Mild tenderness in suprapubic region.  No CVA tenderness  Musculoskeletal: Normal range of motion.  Neurological: She is alert and oriented to person, place, and time.  Skin: Skin is warm and dry.  Psychiatric: She has a normal mood and affect.   Results for orders placed during the  hospital encounter of 07/09/14 (from the past 24 hour(s))  URINALYSIS, ROUTINE W REFLEX MICROSCOPIC     Status: Abnormal   Collection Time    07/09/14  3:45 PM      Result Value Ref Range   Color, Urine YELLOW  YELLOW   APPearance HAZY (*) CLEAR   Specific Gravity, Urine 1.020  1.005 - 1.030   pH 7.0  5.0 - 8.0   Glucose, UA NEGATIVE  NEGATIVE mg/dL   Hgb urine dipstick NEGATIVE  NEGATIVE   Bilirubin Urine NEGATIVE  NEGATIVE   Ketones, ur NEGATIVE  NEGATIVE mg/dL   Protein, ur NEGATIVE  NEGATIVE mg/dL   Urobilinogen, UA 0.2  0.0 - 1.0 mg/dL   Nitrite NEGATIVE  NEGATIVE   Leukocytes, UA NEGATIVE  NEGATIVE  POCT PREGNANCY, URINE     Status: None   Collection Time    07/09/14  4:09 PM      Result Value Ref Range   Preg Test, Ur NEGATIVE  NEGATIVE  WET PREP, GENITAL     Status: Abnormal   Collection Time    07/09/14  5:45 PM      Result Value Ref Range   Yeast Wet Prep HPF POC NONE SEEN  NONE SEEN   Trich, Wet Prep NONE SEEN  NONE SEEN   Clue Cells Wet Prep HPF POC NONE SEEN  NONE SEEN   WBC, Wet Prep HPF POC FEW (*) NONE SEEN    MAU Course  Procedures none MDM Labs/exam consistent with yeast.  No evidence of UTI.  Nonpregnant  Assessment and Plan  A: Abdominal pain, Yeast  P: Discharge to home Diflucan x 1 OTC Simethicone prn OTC Ibuprofen PRN pain up to 4 tabs at a time 3 times a day with food Return to MAU for emergency  Bertram Denver 07/09/2014, 5:17 PM

## 2014-07-09 NOTE — MAU Note (Signed)
Patient states she has been having lower abdominal pain that shoots into her buttocks and has pain with urination. Denies bleeding , has a slight vaginal discharge, Denies nausea, vomiting or diarrhea.

## 2014-07-09 NOTE — MAU Provider Note (Signed)
Attestation of Attending Supervision of Advanced Practitioner (CNM/NP): Evaluation and management procedures were performed by the Advanced Practitioner under my supervision and collaboration.  I have reviewed the Advanced Practitioner's note and chart, and I agree with the management and plan.  Niya Behler 07/09/2014 7:49 PM

## 2014-07-10 LAB — GC/CHLAMYDIA PROBE AMP
CT Probe RNA: NEGATIVE
GC PROBE AMP APTIMA: NEGATIVE

## 2014-07-10 LAB — HIV ANTIBODY (ROUTINE TESTING W REFLEX): HIV 1&2 Ab, 4th Generation: NONREACTIVE

## 2014-08-23 ENCOUNTER — Encounter (HOSPITAL_COMMUNITY): Payer: Self-pay | Admitting: *Deleted

## 2014-10-25 ENCOUNTER — Encounter (HOSPITAL_COMMUNITY): Payer: Self-pay | Admitting: *Deleted

## 2014-10-25 ENCOUNTER — Emergency Department (HOSPITAL_COMMUNITY)
Admission: EM | Admit: 2014-10-25 | Discharge: 2014-10-25 | Disposition: A | Discharge status: Home / Self Care | Payer: Medicaid Other | Attending: Emergency Medicine | Admitting: Emergency Medicine

## 2014-10-25 DIAGNOSIS — Z79899 Other long term (current) drug therapy: Secondary | ICD-10-CM | POA: Insufficient documentation

## 2014-10-25 DIAGNOSIS — R51 Headache: Secondary | ICD-10-CM | POA: Insufficient documentation

## 2014-10-25 DIAGNOSIS — Z8632 Personal history of gestational diabetes: Secondary | ICD-10-CM | POA: Insufficient documentation

## 2014-10-25 DIAGNOSIS — J45909 Unspecified asthma, uncomplicated: Secondary | ICD-10-CM | POA: Insufficient documentation

## 2014-10-25 DIAGNOSIS — R519 Headache, unspecified: Secondary | ICD-10-CM

## 2014-10-25 DIAGNOSIS — E669 Obesity, unspecified: Secondary | ICD-10-CM | POA: Insufficient documentation

## 2014-10-25 MED ORDER — DIPHENHYDRAMINE HCL 25 MG PO CAPS
25.0000 mg | ORAL_CAPSULE | Freq: Once | ORAL | Status: AC
  Administered 2014-10-25: 25 mg via ORAL
  Filled 2014-10-25: qty 1

## 2014-10-25 MED ORDER — METOCLOPRAMIDE HCL 10 MG PO TABS
10.0000 mg | ORAL_TABLET | Freq: Once | ORAL | Status: AC
  Administered 2014-10-25: 10 mg via ORAL
  Filled 2014-10-25: qty 1

## 2014-10-25 MED ORDER — METHOCARBAMOL 500 MG PO TABS
1000.0000 mg | ORAL_TABLET | Freq: Once | ORAL | Status: AC
  Administered 2014-10-25: 1000 mg via ORAL
  Filled 2014-10-25: qty 2

## 2014-10-25 NOTE — ED Provider Notes (Signed)
CSN: [id number]     Arrival date & time 10/25/14  0303 History  This chart was scribed for Olivia Mackie, MD by Annye Asa, ED Scribe. This patient was seen in room D32C/D32C and the patient's care was started at 3:40 AM.    Chief Complaint  Patient presents with  . Headache   The history is provided by the patient. No language interpreter was used.     HPI Comments: DONNICE NIELSEN is a 24 y.o. female who presents to the Emergency Department complaining of headache; localized across her forehead and described as a "tension." Patient reports that she regularly has similar headaches; she notes normal symptoms can include visual disturbances. She normally treats her headaches with ibuprofen, which provides mild relief. Her headache continued after ibuprofen and rest today, prompting her to come to the ED. She denies weakness, numbness, chest pain.   She reports one incident today with coughing and difficulty catching her breath; this improved after drinking water.   Patient does not have a PCP at this time.   Past Medical History  Diagnosis Date  . Asthma   . Allergy     allergic rhinitis  . Gestational diabetes   . Obesity    Past Surgical History  Procedure Laterality Date  . No past surgeries     Family History  Problem Relation Age of Onset  . Hypertension Mother   . Diabetes Father   . Cancer Other   . Asthma Other    History  Substance Use Topics  . Smoking status: Never Smoker   . Smokeless tobacco: Not on file  . Alcohol Use: No   OB History    Gravida Para Term Preterm AB TAB SAB Ectopic Multiple Living   Review of Systems  Neurological: Positive for headaches.      Allergies  Bee venom and Other  Home Medications   Prior to Admission medications   Medication Sig Start Date End Date Taking? Authorizing Provider  fluconazole (DIFLUCAN) 150 MG tablet Take 1 tablet (150 mg total) by mouth daily. 07/09/14   Scot Jun Teague Clark, PA-C    BP 137/77 mmHg  Pulse 96  Temp(Src) 99.8 F (37.7 C)  Resp 20  Ht  (1.676 m)  Wt 280 lb (127.007 kg)  BMI 45.21 kg/m2  SpO2 100%  LMP 10/22/2014 Physical Exam  Constitutional: She is oriented to person, place, and time. She appears well-developed and well-nourished.  HENT:  Head: Normocephalic and atraumatic.  Right Ear: External ear normal.  Left Ear: External ear normal.  Nose: Nose normal.  Mouth/Throat: Oropharynx is clear and moist.  Eyes: Conjunctivae and EOM are normal. Pupils are equal, round, and reactive to light.  Neck: Normal range of motion. Neck supple. No JVD present. No tracheal deviation present. No thyromegaly present.  Cardiovascular: Normal rate, regular rhythm, normal heart sounds and intact distal pulses.  Exam reveals no gallop and no friction rub.   No murmur heard. Pulmonary/Chest: Effort normal and breath sounds normal. No stridor. No respiratory distress. She has no wheezes. She has no rales. She exhibits no tenderness.  Abdominal: Soft. Bowel sounds are normal. She exhibits no distension and no mass. There is no tenderness. There is no rebound and no guarding.  Musculoskeletal: Normal range of motion. She exhibits no edema or tenderness.  Lymphadenopathy:    She has no cervical adenopathy.  Neurological: She is  alert and oriented to person, place, and time. She displays normal reflexes. No cranial nerve deficit. She exhibits normal muscle tone. Coordination normal.  Skin: Skin is warm and dry. No rash noted. No erythema. No pallor.  Psychiatric: She has a normal mood and affect. Her behavior is normal. Judgment and thought content normal.  Nursing note and vitals reviewed.   ED Course  Procedures   DIAGNOSTIC STUDIES: Oxygen Saturation is 100% on RA, normal by my interpretation.    COORDINATION OF CARE: 3:45 AM Discussed treatment plan with pt at bedside and pt agreed to plan.   Labs Review Labs Reviewed - No data to display  Imaging  Review No results found.   EKG Interpretation None      MDM   Final diagnoses:  Frequent headaches    Pt with intermittent headaches, current headache since yesterday.  No change from her baseline headache.  Normal exam.  Will treat pain, have patient f/u with neuro/headache clinic. I personally performed the services described in this documentation, which was scribed in my presence. The recorded information has been reviewed and is accurate.       Olivia Mackie, MD 10/25/14 [phone]

## 2014-10-25 NOTE — ED Notes (Signed)
The pt is c/o a headache since yesterday with nv and diarrhea.  Hx of migraine headaches.  lmp jan 1

## 2014-10-25 NOTE — Discharge Instructions (Signed)
General Headache Without Cause °A headache is pain or discomfort felt around the head or neck area. The specific cause of a headache may not be found. There are many causes and types of headaches. A few common ones are: °· Tension headaches. °· Migraine headaches. °· Cluster headaches. °· Chronic daily headaches. °HOME CARE INSTRUCTIONS  °· Keep all follow-up appointments with your caregiver or any specialist referral. °· Only take over-the-counter or prescription medicines for pain or discomfort as directed by your caregiver. °· Lie down in a dark, quiet room when you have a headache. °· Keep a headache journal to find out what may trigger your migraine headaches. For example, write down: °· What you eat and drink. °· How much sleep you get. °· Any change to your diet or medicines. °· Try massage or other relaxation techniques. °· Put ice packs or heat on the head and neck. Use these 3 to 4 times per day for 15 to 20 minutes each time, or as needed. °· Limit stress. °· Sit up straight, and do not tense your muscles. °· Quit smoking if you smoke. °· Limit alcohol use. °· Decrease the amount of caffeine you drink, or stop drinking caffeine. °· Eat and sleep on a regular schedule. °· Get 7 to 9 hours of sleep, or as recommended by your caregiver. °· Keep lights dim if bright lights bother you and make your headaches worse. °SEEK MEDICAL CARE IF:  °· You have problems with the medicines you were prescribed. °· Your medicines are not working. °· You have a change from the usual headache. °· You have nausea or vomiting. °SEEK IMMEDIATE MEDICAL CARE IF:  °· Your headache becomes severe. °· You have a fever. °· You have a stiff neck. °· You have loss of vision. °· You have muscular weakness or loss of muscle control. °· You start losing your balance or have trouble walking. °· You feel faint or pass out. °· You have severe symptoms that are different from your first symptoms. °MAKE SURE YOU:  °· Understand these  instructions. °· Will watch your condition. °· Will get help right away if you are not doing well or get worse. °Document Released: 10/08/2005 Document Revised: 12/31/2011 Document Reviewed: 10/24/2011 °ExitCare® Patient Information ©2015 [employer]. This information is not intended to replace advice given to you by your health care provider. Make sure you discuss any questions you have with your health care provider. ° °Headaches, Frequently Asked Questions °MIGRAINE HEADACHES °Q: What is migraine? What causes it? How can I treat it? °A: Generally, migraine headaches begin as a dull ache. Then they develop into a constant, throbbing, and pulsating pain. You may experience pain at the temples. You may experience pain at the front or back of one or both sides of the head. The pain is usually accompanied by a combination of: °· Nausea. °· Vomiting. °· Sensitivity to light and noise. °Some people (about 15%) experience an aura (see below) before an attack. The cause of migraine is believed to be chemical reactions in the brain. Treatment for migraine may include over-the-counter or prescription medications. It may also include self-help techniques. These include relaxation training and biofeedback.  °Q: What is an aura? °A: About 15% of people with migraine get an "aura". This is a sign of neurological symptoms that occur before a migraine headache. You may see wavy or jagged lines, dots, or flashing lights. You might experience tunnel vision or blind spots in one or both eyes. The   aura can include visual or auditory hallucinations (something imagined). It may include disruptions in smell (such as strange odors), taste or touch. Other symptoms include: °· Numbness. °· A "pins and needles" sensation. °· Difficulty in recalling or speaking the correct word. °These neurological events may last as long as 60 minutes. These symptoms will fade as the headache begins. °Q: What is a trigger? °A: Certain physical or  environmental factors can lead to or "trigger" a migraine. These include: °· Foods. °· Hormonal changes. °· Weather. °· Stress. °It is important to remember that triggers are different for everyone. To help prevent migraine attacks, you need to figure out which triggers affect you. Keep a headache diary. This is a good way to track triggers. The diary will help you talk to your healthcare professional about your condition. °Q: Does weather affect migraines? °A: Bright sunshine, hot, humid conditions, and drastic changes in barometric pressure may lead to, or "trigger," a migraine attack in some people. But studies have shown that weather does not act as a trigger for everyone with migraines. °Q: What is the link between migraine and hormones? °A: Hormones start and regulate many of your body's functions. Hormones keep your body in balance within a constantly changing environment. The levels of hormones in your body are unbalanced at times. Examples are during menstruation, pregnancy, or menopause. That can lead to a migraine attack. In fact, about three quarters of all women with migraine report that their attacks are related to the menstrual cycle.  °Q: Is there an increased risk of stroke for migraine sufferers? °A: The likelihood of a migraine attack causing a stroke is very remote. That is not to say that migraine sufferers cannot have a stroke associated with their migraines. In persons under age 40, the most common associated factor for stroke is migraine headache. But over the course of a person's normal life span, the occurrence of migraine headache may actually be associated with a reduced risk of dying from cerebrovascular disease due to stroke.  °Q: What are acute medications for migraine? °A: Acute medications are used to treat the pain of the headache after it has started. Examples over-the-counter medications, NSAIDs, ergots, and triptans.  °Q: What are the triptans? °A: Triptans are the newest class  of abortive medications. They are specifically targeted to treat migraine. Triptans are vasoconstrictors. They moderate some chemical reactions in the brain. The triptans work on receptors in your brain. Triptans help to restore the balance of a neurotransmitter called serotonin. Fluctuations in levels of serotonin are thought to be a main cause of migraine.  °Q: Are over-the-counter medications for migraine effective? °A: Over-the-counter, or "OTC," medications may be effective in relieving mild to moderate pain and associated symptoms of migraine. But you should see your caregiver before beginning any treatment regimen for migraine.  °Q: What are preventive medications for migraine? °A: Preventive medications for migraine are sometimes referred to as "prophylactic" treatments. They are used to reduce the frequency, severity, and length of migraine attacks. Examples of preventive medications include antiepileptic medications, antidepressants, beta-blockers, calcium channel blockers, and NSAIDs (nonsteroidal anti-inflammatory drugs). °Q: Why are anticonvulsants used to treat migraine? °A: During the past few years, there has been an increased interest in antiepileptic drugs for the prevention of migraine. They are sometimes referred to as "anticonvulsants". Both epilepsy and migraine may be caused by similar reactions in the brain.  °Q: Why are antidepressants used to treat migraine? °A: Antidepressants are typically used to treat people with   depression. They may reduce migraine frequency by regulating chemical levels, such as serotonin, in the brain.  °Q: What alternative therapies are used to treat migraine? °A: The term "alternative therapies" is often used to describe treatments considered outside the scope of conventional Western medicine. Examples of alternative therapy include acupuncture, acupressure, and yoga. Another common alternative treatment is herbal therapy. Some herbs are believed to relieve  headache pain. Always discuss alternative therapies with your caregiver before proceeding. Some herbal products contain arsenic and other toxins. °TENSION HEADACHES °Q: What is a tension-type headache? What causes it? How can I treat it? °A: Tension-type headaches occur randomly. They are often the result of temporary stress, anxiety, fatigue, or anger. Symptoms include soreness in your temples, a tightening band-like sensation around your head (a "vice-like" ache). Symptoms can also include a pulling feeling, pressure sensations, and contracting head and neck muscles. The headache begins in your forehead, temples, or the back of your head and neck. Treatment for tension-type headache may include over-the-counter or prescription medications. Treatment may also include self-help techniques such as relaxation training and biofeedback. °CLUSTER HEADACHES °Q: What is a cluster headache? What causes it? How can I treat it? °A: Cluster headache gets its name because the attacks come in groups. The pain arrives with little, if any, warning. It is usually on one side of the head. A tearing or bloodshot eye and a runny nose on the same side of the headache may also accompany the pain. Cluster headaches are believed to be caused by chemical reactions in the brain. They have been described as the most severe and intense of any headache type. Treatment for cluster headache includes prescription medication and oxygen. °SINUS HEADACHES °Q: What is a sinus headache? What causes it? How can I treat it? °A: When a cavity in the bones of the face and skull (a sinus) becomes inflamed, the inflammation will cause localized pain. This condition is usually the result of an allergic reaction, a tumor, or an infection. If your headache is caused by a sinus blockage, such as an infection, you will probably have a fever. An x-ray will confirm a sinus blockage. Your caregiver's treatment might include antibiotics for the infection, as well as  antihistamines or decongestants.  °REBOUND HEADACHES °Q: What is a rebound headache? What causes it? How can I treat it? °A: A pattern of taking acute headache medications too often can lead to a condition known as "rebound headache." A pattern of taking too much headache medication includes taking it more than 2 days per week or in excessive amounts. That means more than the label or a caregiver advises. With rebound headaches, your medications not only stop relieving pain, they actually begin to cause headaches. Doctors treat rebound headache by tapering the medication that is being overused. Sometimes your caregiver will gradually substitute a different type of treatment or medication. Stopping may be a challenge. Regularly overusing a medication increases the potential for serious side effects. Consult a caregiver if you regularly use headache medications more than 2 days per week or more than the label advises. °ADDITIONAL QUESTIONS AND ANSWERS °Q: What is biofeedback? °A: Biofeedback is a self-help treatment. Biofeedback uses special equipment to monitor your body's involuntary physical responses. Biofeedback monitors: °· Breathing. °· Pulse. °· Heart rate. °· Temperature. °· Muscle tension. °· Brain activity. °Biofeedback helps you refine and perfect your relaxation exercises. You learn to control the physical responses that are related to stress. Once the technique has been mastered, you   do not need the equipment any more. °Q: Are headaches hereditary? °A: Four out of five (80%) of people that suffer report a family history of migraine. Scientists are not sure if this is genetic or a family predisposition. Despite the uncertainty, a child has a 50% chance of having migraine if one parent suffers. The child has a 75% chance if both parents suffer.  °Q: Can children get headaches? °A: By the time they reach high school, most young people have experienced some type of headache. Many safe and effective  approaches or medications can prevent a headache from occurring or stop it after it has begun.  °Q: What type of doctor should I see to diagnose and treat my headache? °A: Start with your primary caregiver. Discuss his or her experience and approach to headaches. Discuss methods of classification, diagnosis, and treatment. Your caregiver may decide to recommend you to a headache specialist, depending upon your symptoms or other physical conditions. Having diabetes, allergies, etc., may require a more comprehensive and inclusive approach to your headache. The [employer] will provide, upon request, a list of NHF physician members in your state. °Document Released: 12/29/2003 Document Revised: 12/31/2011 Document Reviewed: 06/07/2008 °ExitCare® Patient Information ©2015 [employer]. This information is not intended to replace advice given to you by your health care provider. Make sure you discuss any questions you have with your health care provider. ° °

## 2014-10-25 NOTE — ED Notes (Signed)
Waiting on Reglan from pharmacy.

## 2015-07-03 ENCOUNTER — Encounter (HOSPITAL_COMMUNITY): Payer: Self-pay | Admitting: *Deleted

## 2015-07-03 ENCOUNTER — Inpatient Hospital Stay (HOSPITAL_COMMUNITY)
Admission: AD | Admit: 2015-07-03 | Discharge: 2015-07-03 | Disposition: A | Discharge status: Home / Self Care | Payer: Self-pay | Source: Ambulatory Visit | Attending: Obstetrics & Gynecology | Admitting: Obstetrics & Gynecology

## 2015-07-03 DIAGNOSIS — A499 Bacterial infection, unspecified: Secondary | ICD-10-CM

## 2015-07-03 DIAGNOSIS — B9689 Other specified bacterial agents as the cause of diseases classified elsewhere: Secondary | ICD-10-CM | POA: Insufficient documentation

## 2015-07-03 DIAGNOSIS — N760 Acute vaginitis: Secondary | ICD-10-CM | POA: Insufficient documentation

## 2015-07-03 LAB — WET PREP, GENITAL
Trich, Wet Prep: NONE SEEN
WBC WET PREP: NONE SEEN
YEAST WET PREP: NONE SEEN

## 2015-07-03 LAB — POCT PREGNANCY, URINE: Preg Test, Ur: NEGATIVE

## 2015-07-03 MED ORDER — METRONIDAZOLE 500 MG PO TABS
500.0000 mg | ORAL_TABLET | Freq: Two times a day (BID) | ORAL | Status: DC
Start: 2015-07-03 — End: 2015-09-27

## 2015-07-03 NOTE — Discharge Instructions (Signed)
Bacterial Vaginosis Bacterial vaginosis is a vaginal infection that occurs when the normal balance of bacteria in the vagina is disrupted. It results from an overgrowth of certain bacteria. This is the most common vaginal infection in women of childbearing age. Treatment is important to prevent complications, especially in pregnant women, as it can cause a premature delivery. CAUSES  Bacterial vaginosis is caused by an increase in harmful bacteria that are normally present in smaller amounts in the vagina. Several different kinds of bacteria can cause bacterial vaginosis. However, the reason that the condition develops is not fully understood. RISK FACTORS Certain activities or behaviors can put you at an increased risk of developing bacterial vaginosis, including:  Having a new sex partner or multiple sex partners.  Douching.  Using an intrauterine device (IUD) for contraception. Women do not get bacterial vaginosis from toilet seats, bedding, swimming pools, or contact with objects around them. SIGNS AND SYMPTOMS  Some women with bacterial vaginosis have no signs or symptoms. Common symptoms include:  Grey vaginal discharge.  A fishlike odor with discharge, especially after sexual intercourse.  Itching or burning of the vagina and vulva.  Burning or pain with urination. DIAGNOSIS  Your health care provider will take a medical history and examine the vagina for signs of bacterial vaginosis. A sample of vaginal fluid may be taken. Your health care provider will look at this sample under a microscope to check for bacteria and abnormal cells. A vaginal pH test may also be done.  TREATMENT  Bacterial vaginosis may be treated with antibiotic medicines. These may be given in the form of a pill or a vaginal cream. A second round of antibiotics may be prescribed if the condition comes back after treatment.  HOME CARE INSTRUCTIONS   Only take over-the-counter or prescription medicines as  directed by your health care provider.  If antibiotic medicine was prescribed, take it as directed. Make sure you finish it even if you start to feel better.  Do not have sex until treatment is completed.  Tell all sexual partners that you have a vaginal infection. They should see their health care provider and be treated if they have problems, such as a mild rash or itching.  Practice safe sex by using condoms and only having one sex partner. SEEK MEDICAL CARE IF:   Your symptoms are not improving after 3 days of treatment.  You have increased discharge or pain.  You have a fever. MAKE SURE YOU:   Understand these instructions.  Will watch your condition.  Will get help right away if you are not doing well or get worse. FOR MORE INFORMATION  Centers for Disease Control and Prevention, Division of STD Prevention: www.cdc.gov/std American Sexual Health Association (ASHA): www.ashastd.org  Document Released: 10/08/2005 Document Revised: 07/29/2013 Document Reviewed: 05/20/2013 ExitCare Patient Information 2015 [employer]. This information is not intended to replace advice given to you by your health care provider. Make sure you discuss any questions you have with your health care provider.  

## 2015-07-03 NOTE — MAU Provider Note (Signed)
Chief Complaint: Vaginal Discharge   None     SUBJECTIVE HPI: Lauren [name] is a 24 y.o. G1P1001 who presents to maternity admissions reporting vaginal discharge with odor.  She has been treated for BV in the past and believes she has this infection again.  She denies vaginal bleeding, vaginal itching/burning, urinary symptoms, h/a, dizziness, n/v, or fever/chills.     Vaginal Discharge The patient's primary symptoms include vaginal discharge. The patient's pertinent negatives include no pelvic pain. This is a recurrent problem. The current episode started in the past 7 days. The problem occurs constantly. The problem has been unchanged. The patient is experiencing no pain. She is not pregnant. Pertinent negatives include no abdominal pain, chills, constipation, diarrhea, dysuria, fever, flank pain, frequency, headaches, nausea, painful intercourse or vomiting. The vaginal discharge was clear, malodorous and thin. There has been no bleeding. She has tried douching for the symptoms. The treatment provided no relief. She is sexually active. No, her partner does not have an STD. Her past medical history is significant for vaginosis.    Past Medical History  Diagnosis Date  . Asthma   . Allergy     allergic rhinitis  . Gestational diabetes   . Obesity    Past Surgical History  Procedure Laterality Date  . No past surgeries     Social History   Social History  . Marital Status: Single    Spouse Name: N/A  . Number of Children: N/A  . Years of Education: N/A   Occupational History  . Not on file.   Social History Main Topics  . Smoking status: Never Smoker   . Smokeless tobacco: Not on file  . Alcohol Use: No  . Drug Use: No  . Sexual Activity: Yes    Birth Control/ Protection: None     Comment: Had depo postpartum   Other Topics Concern  . Not on file   Social History Narrative   No current facility-administered medications on file prior to encounter.   No current  outpatient prescriptions on file prior to encounter.   Allergies  Allergen Reactions  . Bee Venom Swelling  . Other Hives and Other (See Comments)    Pt states that she is allergic to Zaxby's zax sauce.    ROS:  Review of Systems  Constitutional: Negative for fever, chills and fatigue.  HENT: Negative for sinus pressure.   Eyes: Negative for photophobia.  Respiratory: Negative for shortness of breath.   Cardiovascular: Negative for chest pain.  Gastrointestinal: Negative for nausea, vomiting, abdominal pain, diarrhea and constipation.  Genitourinary: Positive for vaginal discharge. Negative for dysuria, frequency, flank pain, vaginal bleeding, difficulty urinating, vaginal pain and pelvic pain.  Musculoskeletal: Negative for neck pain.  Neurological: Negative for dizziness, weakness and headaches.  Psychiatric/Behavioral: Negative.      I have reviewed patient's Past Medical Hx, Surgical Hx, Family Hx, Social Hx, medications and allergies.   Physical Exam   Patient Vitals for the past 24 hrs:  BP Temp Temp src Pulse Resp Height Weight  07/03/15 2134 126/78 mmHg 97.9 F (36.6 C) Oral 107 18 - -  07/03/15 1806 134/70 mmHg 98.8 F (37.1 C) Oral 84 16 5' 4.5" (1.638 m) 264 lb 9.6 oz (120.022 kg)   Constitutional: Well-developed, well-nourished female in no acute distress.  Cardiovascular: normal rate Respiratory: normal effort GI: Abd soft, non-tender. Pos BS x 4 MS: Extremities nontender, no edema, normal ROM Neurologic: Alert and oriented x 4.  GU: Neg CVAT.  PELVIC EXAM: Cervix pink, visually closed, without lesion, small amount thin malodorous discharge, vaginal walls and external genitalia normal Bimanual exam: Cervix 0/long/high, firm, anterior, neg CMT, uterus nontender, nonenlarged, adnexa without tenderness, enlargement, or mass   LAB RESULTS Results for orders placed or performed during the hospital encounter of 07/03/15 (from the past 24 hour(s))  Pregnancy,  urine POC     Status: None   Collection Time: 07/03/15  6:47 PM  Result Value Ref Range   Preg Test, Ur NEGATIVE NEGATIVE  GC/Chlamydia Probe Amp *Canceled*     Status: None ()   Collection Time: 07/03/15  8:30 PM   Narrative   LIS Cancel (ORR/DE = Data Error)  Wet prep, genital     Status: Abnormal   Collection Time: 07/03/15  8:30 PM  Result Value Ref Range   Yeast Wet Prep HPF POC NONE SEEN NONE SEEN   Trich, Wet Prep NONE SEEN NONE SEEN   Clue Cells Wet Prep HPF POC MODERATE (A) NONE SEEN   WBC, Wet Prep HPF POC NONE SEEN NONE SEEN       IMAGING No results found.  MAU Management/MDM: Ordered labs and reviewed results. Pt stable at time of discharge.  ASSESSMENT 1. BV (bacterial vaginosis)     PLAN Discharge home Flagyl 500 mg BID x 7 days Discussed prevention of vaginal infections, recommend stop douching, use probiotics daily, breathable cotton underwear   Follow-up Information    Follow up with [facility].   Specialty:  Obstetrics and Gynecology   Why:  for routine Gyn care   Contact information:   [address] [place] [address] [phone]      Follow up with [facility] [place] [facility].   Why:  As needed for emergencies   Contact information:   [address] [id number] [place] [address] [phone]      Sharen Counter Certified Nurse-Midwife 07/04/2015  8:29 AM

## 2015-07-03 NOTE — MAU Note (Signed)
Vag d/c with odor, noted for a month.  Has been douching, sitting in vinegar water and it won't go away

## 2015-07-04 LAB — GC/CHLAMYDIA PROBE AMP (~~LOC~~) NOT AT ARMC
CHLAMYDIA, DNA PROBE: NEGATIVE
NEISSERIA GONORRHEA: NEGATIVE

## 2015-09-27 ENCOUNTER — Encounter (HOSPITAL_COMMUNITY): Payer: Self-pay | Admitting: *Deleted

## 2015-09-27 ENCOUNTER — Inpatient Hospital Stay (HOSPITAL_COMMUNITY)
Admission: AD | Admit: 2015-09-27 | Discharge: 2015-09-27 | Disposition: A | Discharge status: Home / Self Care | Payer: Self-pay | Source: Ambulatory Visit | Attending: Obstetrics & Gynecology | Admitting: Obstetrics & Gynecology

## 2015-09-27 DIAGNOSIS — J069 Acute upper respiratory infection, unspecified: Secondary | ICD-10-CM

## 2015-09-27 DIAGNOSIS — Z3202 Encounter for pregnancy test, result negative: Secondary | ICD-10-CM | POA: Insufficient documentation

## 2015-09-27 LAB — URINALYSIS, ROUTINE W REFLEX MICROSCOPIC
Glucose, UA: NEGATIVE mg/dL
HGB URINE DIPSTICK: NEGATIVE
Ketones, ur: 15 mg/dL — AB
LEUKOCYTES UA: NEGATIVE
Nitrite: NEGATIVE
PROTEIN: NEGATIVE mg/dL
Specific Gravity, Urine: >1.03 — ABNORMAL HIGH (ref 1.005–1.030)
pH: 5.5 (ref 5.0–8.0)

## 2015-09-27 LAB — POCT PREGNANCY, URINE: Preg Test, Ur: NEGATIVE

## 2015-09-27 NOTE — Discharge Instructions (Signed)
Upper Respiratory Infection, Adult Most upper respiratory infections (URIs) are a viral infection of the air passages leading to the lungs. A URI affects the nose, throat, and upper air passages. The most common type of URI is nasopharyngitis and is typically referred to as "the common cold." URIs run their course and usually go away on their own. Most of the time, a URI does not require medical attention, but sometimes a bacterial infection in the upper airways can follow a viral infection. This is called a secondary infection. Sinus and middle ear infections are common types of secondary upper respiratory infections. Bacterial pneumonia can also complicate a URI. A URI can worsen asthma and chronic obstructive pulmonary disease (COPD). Sometimes, these complications can require emergency medical care and may be life threatening.  CAUSES Almost all URIs are caused by viruses. A virus is a type of germ and can spread from one person to another.  RISKS FACTORS You may be at risk for a URI if:   You smoke.   You have chronic heart or lung disease.  You have a weakened defense (immune) system.   You are very young or very old.   You have nasal allergies or asthma.  You work in crowded or poorly ventilated areas.  You work in health care facilities or schools. SIGNS AND SYMPTOMS  Symptoms typically develop 2-3 days after you come in contact with a cold virus. Most viral URIs last 7-10 days. However, viral URIs from the influenza virus (flu virus) can last 14-18 days and are typically more severe. Symptoms may include:   Runny or stuffy (congested) nose.   Sneezing.   Cough.   Sore throat.   Headache.   Fatigue.   Fever.   Loss of appetite.   Pain in your forehead, behind your eyes, and over your cheekbones (sinus pain).  Muscle aches.  DIAGNOSIS  Your health care provider may diagnose a URI by:  Physical exam.  Tests to check that your symptoms are not due to  another condition such as:  Strep throat.  Sinusitis.  Pneumonia.  Asthma. TREATMENT  A URI goes away on its own with time. It cannot be cured with medicines, but medicines may be prescribed or recommended to relieve symptoms. Medicines may help:  Reduce your fever.  Reduce your cough.  Relieve nasal congestion. HOME CARE INSTRUCTIONS   Take medicines only as directed by your health care provider.   Gargle warm saltwater or take cough drops to comfort your throat as directed by your health care provider.  Use a warm mist humidifier or inhale steam from a shower to increase air moisture. This may make it easier to breathe.  Drink enough fluid to keep your urine clear or pale yellow.   Eat soups and other clear broths and maintain good nutrition.   Rest as needed.   Return to work when your temperature has returned to normal or as your health care provider advises. You may need to stay home longer to avoid infecting others. You can also use a face mask and careful hand washing to prevent spread of the virus.  Increase the usage of your inhaler if you have asthma.   Do not use any tobacco products, including cigarettes, chewing tobacco, or electronic cigarettes. If you need help quitting, ask your health care provider. PREVENTION  The best way to protect yourself from getting a cold is to practice good hygiene.   Avoid oral or hand contact with people with cold   symptoms.   Wash your hands often if contact occurs.  There is no clear evidence that vitamin C, vitamin E, echinacea, or exercise reduces the chance of developing a cold. However, it is always recommended to get plenty of rest, exercise, and practice good nutrition.  SEEK MEDICAL CARE IF:   You are getting worse rather than better.   Your symptoms are not controlled by medicine.   You have chills.  You have worsening shortness of breath.  You have brown or red mucus.  You have yellow or brown nasal  discharge.  You have pain in your face, especially when you bend forward.  You have a fever.  You have swollen neck glands.  You have pain while swallowing.  You have white areas in the back of your throat. SEEK IMMEDIATE MEDICAL CARE IF:   You have severe or persistent:  Headache.  Ear pain.  Sinus pain.  Chest pain.  You have chronic lung disease and any of the following:  Wheezing.  Prolonged cough.  Coughing up blood.  A change in your usual mucus.  You have a stiff neck.  You have changes in your:  Vision.  Hearing.  Thinking.  Mood. MAKE SURE YOU:   Understand these instructions.  Will watch your condition.  Will get help right away if you are not doing well or get worse.   This information is not intended to replace advice given to you by your health care provider. Make sure you discuss any questions you have with your health care provider.   Document Released: 04/03/2001 Document Revised: 02/22/2015 Document Reviewed: 01/13/2014 Elsevier Interactive Patient Education 2016 Elsevier Inc.  

## 2015-09-27 NOTE — MAU Note (Signed)
PT SAYS  SHE GETS NAUSEA  WITH COOKING - STARTED  WITH LMP.   ALWAYS  GETS  H/A.-    THIS  ONE  STARTED AT  5PM-   NO MEDS.    DIZZY STARTED  AT  9PM.   NO BIRTH  CONTROL- LAST  SEX-    11-30.     HPT-  X2  NEG..Marland Kitchen

## 2015-09-27 NOTE — MAU Provider Note (Signed)
History     CSN: [id number]  Arrival date and time: 09/27/15 9562   First Provider Initiated Contact with Patient 09/27/15 0202      No chief complaint on file.  URI  This is a new problem. The current episode started in the past 7 days. The problem has been waxing and waning. There has been no fever. Associated symptoms include nausea and a sore throat. Pertinent negatives include no abdominal pain, coughing, diarrhea, dysuria, headaches or vomiting. She has tried nothing for the symptoms.   Past Medical History  Diagnosis Date  . Asthma   . Allergy     allergic rhinitis  . Gestational diabetes   . Obesity     Past Surgical History  Procedure Laterality Date  . No past surgeries      Family History  Problem Relation Age of Onset  . Hypertension Mother   . Diabetes Father   . Cancer Other   . Asthma Other     Social History  Substance Use Topics  . Smoking status: Never Smoker   . Smokeless tobacco: None  . Alcohol Use: No    Allergies:  Allergies  Allergen Reactions  . Bee Venom Swelling  . Other Hives and Other (See Comments)    Pt states that she is allergic to Zaxby's zax sauce.    Prescriptions prior to admission  Medication Sig Dispense Refill Last Dose  . acetaminophen (TYLENOL) 500 MG tablet Take 1,000 mg by mouth every 6 (six) hours as needed for mild pain or headache.   Past Month at Unknown time  . metroNIDAZOLE (FLAGYL) 500 MG tablet Take 1 tablet (500 mg total) by mouth 2 (two) times daily. 14 tablet 0     Review of Systems  Constitutional: Negative for fever and chills.  HENT: Positive for sore throat.   Respiratory: Negative for cough.   Gastrointestinal: Positive for nausea. Negative for vomiting, abdominal pain, diarrhea and constipation.  Genitourinary: Negative for dysuria, urgency and frequency.  Neurological: Negative for headaches.   Physical Exam   Blood pressure 140/69, pulse 96, temperature 98.5 F (36.9 C), temperature  source Oral, resp. rate 18, height  (1.651 m), weight 120.714 kg (266 lb 2 oz), last menstrual period 09/02/2015, currently breastfeeding.  Physical Exam  Nursing note and vitals reviewed. Constitutional: She is oriented to person, place, and time. She appears well-developed and well-nourished. No distress.  HENT:  Head: Normocephalic.  Eyes: Pupils are equal, round, and reactive to light.  Cardiovascular: Normal rate.   Respiratory: Effort normal.  GI: Soft. There is no tenderness. There is no rebound.  Neurological: She is alert and oriented to person, place, and time.  Skin: Skin is warm and dry.  Psychiatric: She has a normal mood and affect.   Results for orders placed or performed during the hospital encounter of 09/27/15 (from the past 24 hour(s))  Urinalysis, Routine w reflex microscopic (not at [facility])     Status: Abnormal   Collection Time: 09/27/15  1:19 AM  Result Value Ref Range   Color, Urine YELLOW YELLOW   APPearance CLEAR CLEAR   Specific Gravity, Urine >1.030 (H) 1.005 - 1.030   pH 5.5 5.0 - 8.0   Glucose, UA NEGATIVE NEGATIVE mg/dL   Hgb urine dipstick NEGATIVE NEGATIVE   Bilirubin Urine SMALL (A) NEGATIVE   Ketones, ur 15 (A) NEGATIVE mg/dL   Protein, ur NEGATIVE NEGATIVE mg/dL   Nitrite NEGATIVE NEGATIVE   Leukocytes, UA NEGATIVE NEGATIVE  Pregnancy, urine POC     Status: None   Collection Time: 09/27/15  1:31 AM  Result Value Ref Range   Preg Test, Ur NEGATIVE NEGATIVE    MAU Course  Procedures  MDM   Assessment and Plan   1. Viral URI    DC home Comfort measures reviewed  OTC treatments recommended  RX: none    Follow-up Information    [facility]   Specialty:  Urgent Care   Why:  If symptoms worsen   Contact information:   [address] [place] [id number] [phone]        Tawnya CrookHogan, Dorlis Judice Donovan 09/27/2015, 2:05 AM

## 2015-10-18 ENCOUNTER — Inpatient Hospital Stay (HOSPITAL_COMMUNITY)
Admission: AD | Admit: 2015-10-18 | Discharge: 2015-10-18 | Disposition: A | Discharge status: Home / Self Care | Payer: Self-pay | Source: Ambulatory Visit | Attending: Family Medicine | Admitting: Family Medicine

## 2015-10-18 ENCOUNTER — Encounter (HOSPITAL_COMMUNITY): Payer: Self-pay | Admitting: Student

## 2015-10-18 DIAGNOSIS — Z3201 Encounter for pregnancy test, result positive: Secondary | ICD-10-CM | POA: Insufficient documentation

## 2015-10-18 DIAGNOSIS — J45909 Unspecified asthma, uncomplicated: Secondary | ICD-10-CM | POA: Insufficient documentation

## 2015-10-18 DIAGNOSIS — E669 Obesity, unspecified: Secondary | ICD-10-CM | POA: Insufficient documentation

## 2015-10-18 DIAGNOSIS — N926 Irregular menstruation, unspecified: Secondary | ICD-10-CM | POA: Insufficient documentation

## 2015-10-18 LAB — POCT PREGNANCY, URINE: Preg Test, Ur: POSITIVE — AB

## 2015-10-18 NOTE — Discharge Instructions (Signed)
First Trimester of Pregnancy The first trimester of pregnancy is from week 1 until the end of week 12 (months 1 through 3). A week after a sperm fertilizes an egg, the egg will implant on the wall of the uterus. This embryo will begin to develop into a baby. Genes from you and your partner are forming the baby. The female genes determine whether the baby is a boy or a girl. At 6-8 weeks, the eyes and face are formed, and the heartbeat can be seen on ultrasound. At the end of 12 weeks, all the baby's organs are formed.  Now that you are pregnant, you will want to do everything you can to have a healthy baby. Two of the most important things are to get good prenatal care and to follow your health care provider's instructions. Prenatal care is all the medical care you receive before the baby's birth. This care will help prevent, find, and treat any problems during the pregnancy and childbirth. BODY CHANGES Your body goes through many changes during pregnancy. The changes vary from woman to woman.   You may gain or lose a couple of pounds at first.  You may feel sick to your stomach (nauseous) and throw up (vomit). If the vomiting is uncontrollable, call your health care provider.  You may tire easily.  You may develop headaches that can be relieved by medicines approved by your health care provider.  You may urinate more often. Painful urination may mean you have a bladder infection.  You may develop heartburn as a result of your pregnancy.  You may develop constipation because certain hormones are causing the muscles that push waste through your intestines to slow down.  You may develop hemorrhoids or swollen, bulging veins (varicose veins).  Your breasts may begin to grow larger and become tender. Your nipples may stick out more, and the tissue that surrounds them (areola) may become darker.  Your gums may bleed and may be sensitive to brushing and flossing.  Dark spots or blotches  (chloasma, mask of pregnancy) may develop on your face. This will likely fade after the baby is born.  Your menstrual periods will stop.  You may have a loss of appetite.  You may develop cravings for certain kinds of food.  You may have changes in your emotions from day to day, such as being excited to be pregnant or being concerned that something may go wrong with the pregnancy and baby.  You may have more vivid and strange dreams.  You may have changes in your hair. These can include thickening of your hair, rapid growth, and changes in texture. Some women also have hair loss during or after pregnancy, or hair that feels dry or thin. Your hair will most likely return to normal after your baby is born. WHAT TO EXPECT AT YOUR PRENATAL VISITS During a routine prenatal visit:  You will be weighed to make sure you and the baby are growing normally.  Your blood pressure will be taken.  Your abdomen will be measured to track your baby's growth.  The fetal heartbeat will be listened to starting around week 10 or 12 of your pregnancy.  Test results from any previous visits will be discussed. Your health care provider may ask you:  How you are feeling.  If you are feeling the baby move.  If you have had any abnormal symptoms, such as leaking fluid, bleeding, severe headaches, or abdominal cramping.  If you are using any tobacco products,   including cigarettes, chewing tobacco, and electronic cigarettes.  If you have any questions. Other tests that may be performed during your first trimester include:  Blood tests to find your blood type and to check for the presence of any previous infections. They will also be used to check for low iron levels (anemia) and Rh antibodies. Later in the pregnancy, blood tests for diabetes will be done along with other tests if problems develop.  Urine tests to check for infections, diabetes, or protein in the urine.  An ultrasound to confirm the  proper growth and development of the baby.  An amniocentesis to check for possible genetic problems.  Fetal screens for spina bifida and Down syndrome.  You may need other tests to make sure you and the baby are doing well.  HIV (human immunodeficiency virus) testing. Routine prenatal testing includes screening for HIV, unless you choose not to have this test. HOME CARE INSTRUCTIONS  Medicines  Follow your health care provider's instructions regarding medicine use. Specific medicines may be either safe or unsafe to take during pregnancy.  Take your prenatal vitamins as directed.  If you develop constipation, try taking a stool softener if your health care provider approves. Diet  Eat regular, well-balanced meals. Choose a variety of foods, such as meat or vegetable-based protein, fish, milk and low-fat dairy products, vegetables, fruits, and whole grain breads and cereals. Your health care provider will help you determine the amount of weight gain that is right for you.  Avoid raw meat and uncooked cheese. These carry germs that can cause birth defects in the baby.  Eating four or five small meals rather than three large meals a day may help relieve nausea and vomiting. If you start to feel nauseous, eating a few soda crackers can be helpful. Drinking liquids between meals instead of during meals also seems to help nausea and vomiting.  If you develop constipation, eat more high-fiber foods, such as fresh vegetables or fruit and whole grains. Drink enough fluids to keep your urine clear or pale yellow. Activity and Exercise  Exercise only as directed by your health care provider. Exercising will help you:  Control your weight.  Stay in shape.  Be prepared for labor and delivery.  Experiencing pain or cramping in the lower abdomen or low back is a good sign that you should stop exercising. Check with your health care provider before continuing normal exercises.  Try to avoid  standing for long periods of time. Move your legs often if you must stand in one place for a long time.  Avoid heavy lifting.  Wear low-heeled shoes, and practice good posture.  You may continue to have sex unless your health care provider directs you otherwise. Relief of Pain or Discomfort  Wear a good support bra for breast tenderness.   Take warm sitz baths to soothe any pain or discomfort caused by hemorrhoids. Use hemorrhoid cream if your health care provider approves.   Rest with your legs elevated if you have leg cramps or low back pain.  If you develop varicose veins in your legs, wear support hose. Elevate your feet for 15 minutes, 3-4 times a day. Limit salt in your diet. Prenatal Care  Schedule your prenatal visits by the twelfth week of pregnancy. They are usually scheduled monthly at first, then more often in the last 2 months before delivery.  Write down your questions. Take them to your prenatal visits.  Keep all your prenatal visits as directed by your   health care provider. Safety  Wear your seat belt at all times when driving.  Make a list of emergency phone numbers, including numbers for family, friends, the hospital, and police and fire departments. General Tips  Ask your health care provider for a referral to a local prenatal education class. Begin classes no later than at the beginning of month 6 of your pregnancy.  Ask for help if you have counseling or nutritional needs during pregnancy. Your health care provider can offer advice or refer you to specialists for help with various needs.  Do not use hot tubs, steam rooms, or saunas.  Do not douche or use tampons or scented sanitary pads.  Do not cross your legs for long periods of time.  Avoid cat litter boxes and soil used by cats. These carry germs that can cause birth defects in the baby and possibly loss of the fetus by miscarriage or stillbirth.  Avoid all smoking, herbs, alcohol, and medicines  not prescribed by your health care provider. Chemicals in these affect the formation and growth of the baby.  Do not use any tobacco products, including cigarettes, chewing tobacco, and electronic cigarettes. If you need help quitting, ask your health care provider. You may receive counseling support and other resources to help you quit.  Schedule a dentist appointment. At home, brush your teeth with a soft toothbrush and be gentle when you floss. SEEK MEDICAL CARE IF:   You have dizziness.  You have mild pelvic cramps, pelvic pressure, or nagging pain in the abdominal area.  You have persistent nausea, vomiting, or diarrhea.  You have a bad smelling vaginal discharge.  You have pain with urination.  You notice increased swelling in your face, hands, legs, or ankles. SEEK IMMEDIATE MEDICAL CARE IF:   You have a fever.  You are leaking fluid from your vagina.  You have spotting or bleeding from your vagina.  You have severe abdominal cramping or pain.  You have rapid weight gain or loss.  You vomit blood or material that looks like coffee grounds.  You are exposed to German measles and have never had them.  You are exposed to fifth disease or chickenpox.  You develop a severe headache.  You have shortness of breath.  You have any kind of trauma, such as from a fall or a car accident.   This information is not intended to replace advice given to you by your health care provider. Make sure you discuss any questions you have with your health care provider.   Document Released: 10/02/2001 Document Revised: 10/29/2014 Document Reviewed: 08/18/2013 [employer] Interactive Patient Education 2016 [employer]  Safe Medications in Pregnancy   Acne: Benzoyl Peroxide Salicylic Acid  Backache/Headache: Tylenol: 2 regular strength every 4 hours OR              2 Extra strength every 6 hours  Colds/Coughs/Allergies: Benadryl (alcohol free) 25 mg every 6 hours as  needed Breath right strips Claritin Cepacol throat lozenges Chloraseptic throat spray Cold-Eeze- up to three times per day Cough drops, alcohol free Flonase (by prescription only) Guaifenesin Mucinex Robitussin DM (plain only, alcohol free) Saline nasal spray/drops Sudafed (pseudoephedrine) & Actifed ** use only after [redacted] weeks gestation and if you do not have high blood pressure Tylenol Vicks Vaporub Zinc lozenges Zyrtec   Constipation: Colace Ducolax suppositories Fleet enema Glycerin suppositories Metamucil Milk of magnesia Miralax Senokot Smooth move tea  Diarrhea: Kaopectate Imodium A-D  *NO pepto Bismol  Hemorrhoids: Anusol Anusol   HC Preparation H Tucks  Indigestion: Tums Maalox Mylanta Zantac  Pepcid  Insomnia: Benadryl (alcohol free) 25mg every 6 hours as needed Tylenol PM Unisom, no Gelcaps  Leg Cramps: Tums MagGel  Nausea/Vomiting:  Bonine Dramamine Emetrol Ginger extract Sea bands Meclizine  Nausea medication to take during pregnancy:  Unisom (doxylamine succinate 25 mg tablets) Take one tablet daily at bedtime. If symptoms are not adequately controlled, the dose can be increased to a maximum recommended dose of two tablets daily (1/2 tablet in the morning, 1/2 tablet mid-afternoon and one at bedtime). Vitamin B6 100mg tablets. Take one tablet twice a day (up to 200 mg per day).  Skin Rashes: Aveeno products Benadryl cream or 25mg every 6 hours as needed Calamine Lotion 1% cortisone cream  Yeast infection: Gyne-lotrimin 7 Monistat 7   **If taking multiple medications, please check labels to avoid duplicating the same active ingredients **take medication as directed on the label ** Do not exceed 4000 mg of tylenol in 24 hours **Do not take medications that contain aspirin or ibuprofen    

## 2015-10-18 NOTE — [facility] Provider Note (Signed)
S:  Lauren [name] is a 24 y.o. G2P1001 at [id number] wks here for confirmation of pregnancy.  Patient's Patient's last menstrual period was 09/02/2015 (approximate)..  Denies any vaginal bleeding or abdominal pain.  [facility] Gay[facility].  O:  Past Medical History  Diagnosis Date  . Asthma   . Allergy     allergic rhinitis  . Gestational diabetes   . Obesity     Family History  Problem Relation Age of Onset  . Hypertension Mother   . Diabetes Father   . Cancer Other   . Asthma Other      Filed Vitals:   10/18/15 1915  BP: 145/68  Pulse: 87  Temp: 98.8 F (37.1 C)  Resp: 18   General:  A&OX3 with no signs of acute distress. She appears well-developed and well-nourished. No distress.  Neck: Normal range of motion.  Pulmonary/Chest: Effort normal. No respiratory distress.  Musculoskeletal: Normal range of motion.  Neurological: She is alert and oriented to person, place, and time.  Skin: Skin is warm and dry.   A: Positive Pregnancy Test  P: Explained benefits of taking prenatal vitamins w/folic acid early in pregnancy. Begin prenatal care. Reviewed warning signs of pregnancy.  Judeth HornErin Adryel Wortmann, NP

## 2015-10-18 NOTE — [facility] Note (Signed)
Pt reports off/on nausea for the last month, negative preg test last month but had a positive one yesterday.

## 2015-10-22 ENCOUNTER — Inpatient Hospital Stay (HOSPITAL_COMMUNITY): Payer: Medicaid Other

## 2015-10-22 ENCOUNTER — Encounter (HOSPITAL_COMMUNITY): Payer: Self-pay | Admitting: *Deleted

## 2015-10-22 ENCOUNTER — Inpatient Hospital Stay (HOSPITAL_COMMUNITY)
Admission: AD | Admit: 2015-10-22 | Discharge: 2015-10-22 | Disposition: A | Discharge status: Home / Self Care | Payer: Self-pay | Source: Ambulatory Visit | Attending: Obstetrics & Gynecology | Admitting: Obstetrics & Gynecology

## 2015-10-22 DIAGNOSIS — J45909 Unspecified asthma, uncomplicated: Secondary | ICD-10-CM | POA: Insufficient documentation

## 2015-10-22 DIAGNOSIS — O4691 Antepartum hemorrhage, unspecified, first trimester: Secondary | ICD-10-CM

## 2015-10-22 DIAGNOSIS — Z3A01 Less than 8 weeks gestation of pregnancy: Secondary | ICD-10-CM | POA: Insufficient documentation

## 2015-10-22 DIAGNOSIS — O26892 Other specified pregnancy related conditions, second trimester: Secondary | ICD-10-CM | POA: Insufficient documentation

## 2015-10-22 DIAGNOSIS — O209 Hemorrhage in early pregnancy, unspecified: Secondary | ICD-10-CM | POA: Insufficient documentation

## 2015-10-22 DIAGNOSIS — O360110 Maternal care for anti-D [Rh] antibodies, first trimester, not applicable or unspecified: Secondary | ICD-10-CM

## 2015-10-22 DIAGNOSIS — E669 Obesity, unspecified: Secondary | ICD-10-CM | POA: Insufficient documentation

## 2015-10-22 DIAGNOSIS — Z3491 Encounter for supervision of normal pregnancy, unspecified, first trimester: Secondary | ICD-10-CM

## 2015-10-22 LAB — HCG, QUANTITATIVE, PREGNANCY: HCG, BETA CHAIN, QUANT, S: 49259 m[IU]/mL — AB (ref <5)

## 2015-10-22 LAB — WET PREP, GENITAL
CLUE CELLS WET PREP: NONE SEEN
SPERM: NONE SEEN
TRICH WET PREP: NONE SEEN
YEAST WET PREP: NONE SEEN

## 2015-10-22 LAB — CBC
HCT: 35.5 % — ABNORMAL LOW (ref 36.0–46.0)
Hemoglobin: 11.6 g/dL — ABNORMAL LOW (ref 12.0–15.0)
MCH: 27.2 pg (ref 26.0–34.0)
MCHC: 32.7 g/dL (ref 30.0–36.0)
MCV: 83.3 fL (ref 78.0–100.0)
PLATELETS: 254 10*3/uL (ref 150–400)
RBC: 4.26 MIL/uL (ref 3.87–5.11)
RDW: 14.2 % (ref 11.5–15.5)
WBC: 5.7 10*3/uL (ref 4.0–10.5)

## 2015-10-22 LAB — URINALYSIS, ROUTINE W REFLEX MICROSCOPIC
BILIRUBIN URINE: NEGATIVE
Glucose, UA: NEGATIVE mg/dL
KETONES UR: 15 mg/dL — AB
Nitrite: NEGATIVE
PROTEIN: NEGATIVE mg/dL
SPECIFIC GRAVITY, URINE: 1.02 (ref 1.005–1.030)
pH: 6 (ref 5.0–8.0)

## 2015-10-22 LAB — URINE MICROSCOPIC-ADD ON

## 2015-10-22 MED ORDER — RHO D IMMUNE GLOBULIN 1500 UNIT/2ML IJ SOSY
300.0000 ug | PREFILLED_SYRINGE | Freq: Once | INTRAMUSCULAR | Status: AC
  Administered 2015-10-22: 300 ug via INTRAMUSCULAR
  Filled 2015-10-22: qty 2

## 2015-10-22 MED ORDER — PROMETHAZINE HCL 25 MG PO TABS: 25.0000 mg | ORAL_TABLET | Freq: Four times a day (QID) | ORAL | Status: DC | PRN

## 2015-10-22 NOTE — Discharge Instructions (Signed)
First Trimester of Pregnancy  The first trimester of pregnancy is from week 1 until the end of week 12 (months 1 through 3). A week after a sperm fertilizes an egg, the egg will implant on the wall of the uterus. This embryo will begin to develop into a baby. Genes from you and your partner are forming the baby. The female genes determine whether the baby is a boy or a girl. At 6-8 weeks, the eyes and face are formed, and the heartbeat can be seen on ultrasound. At the end of 12 weeks, all the baby's organs are formed.   Now that you are pregnant, you will want to do everything you can to have a healthy baby. Two of the most important things are to get good prenatal care and to follow your health care provider's instructions. Prenatal care is all the medical care you receive before the baby's birth. This care will help prevent, find, and treat any problems during the pregnancy and childbirth.  BODY CHANGES  Your body goes through many changes during pregnancy. The changes vary from woman to woman.   · You may gain or lose a couple of pounds at first.  · You may feel sick to your stomach (nauseous) and throw up (vomit). If the vomiting is uncontrollable, call your health care provider.  · You may tire easily.  · You may develop headaches that can be relieved by medicines approved by your health care provider.  · You may urinate more often. Painful urination may mean you have a bladder infection.  · You may develop heartburn as a result of your pregnancy.  · You may develop constipation because certain hormones are causing the muscles that push waste through your intestines to slow down.  · You may develop hemorrhoids or swollen, bulging veins (varicose veins).  · Your breasts may begin to grow larger and become tender. Your nipples may stick out more, and the tissue that surrounds them (areola) may become darker.  · Your gums may bleed and may be sensitive to brushing and flossing.   · Dark spots or blotches (chloasma, mask of pregnancy) may develop on your face. This will likely fade after the baby is born.  · Your menstrual periods will stop.  · You may have a loss of appetite.  · You may develop cravings for certain kinds of food.  · You may have changes in your emotions from day to day, such as being excited to be pregnant or being concerned that something may go wrong with the pregnancy and baby.  · You may have more vivid and strange dreams.  · You may have changes in your hair. These can include thickening of your hair, rapid growth, and changes in texture. Some women also have hair loss during or after pregnancy, or hair that feels dry or thin. Your hair will most likely return to normal after your baby is born.  WHAT TO EXPECT AT YOUR PRENATAL VISITS  During a routine prenatal visit:  · You will be weighed to make sure you and the baby are growing normally.  · Your blood pressure will be taken.  · Your abdomen will be measured to track your baby's growth.  · The fetal heartbeat will be listened to starting around week 10 or 12 of your pregnancy.  · Test results from any previous visits will be discussed.  Your health care provider may ask you:  · How you are feeling.  · If you   including cigarettes, chewing tobacco, and electronic cigarettes. °· If you have any questions. °Other tests that may be performed during your first trimester include: °· Blood tests to find your blood type and to check for the presence of any previous infections. They will also be used to check for low iron levels (anemia) and Rh antibodies. Later in the pregnancy, blood tests for diabetes will be done along with other tests if problems develop. °· Urine tests to check for infections, diabetes, or protein in the urine. °· An ultrasound to confirm the proper growth  and development of the baby. °· An amniocentesis to check for possible genetic problems. °· Fetal screens for spina bifida and Down syndrome. °· You may need other tests to make sure you and the baby are doing well. °· HIV (human immunodeficiency virus) testing. Routine prenatal testing includes screening for HIV, unless you choose not to have this test. °HOME CARE INSTRUCTIONS  °Medicines °· Follow your health care provider's instructions regarding medicine use. Specific medicines may be either safe or unsafe to take during pregnancy. °· Take your prenatal vitamins as directed. °· If you develop constipation, try taking a stool softener if your health care provider approves. °Diet °· Eat regular, well-balanced meals. Choose a variety of foods, such as meat or vegetable-based protein, fish, milk and low-fat dairy products, vegetables, fruits, and whole grain breads and cereals. Your health care provider will help you determine the amount of weight gain that is right for you. °· Avoid raw meat and uncooked cheese. These carry germs that can cause birth defects in the baby. °· Eating four or five small meals rather than three large meals a day may help relieve nausea and vomiting. If you start to feel nauseous, eating a few soda crackers can be helpful. Drinking liquids between meals instead of during meals also seems to help nausea and vomiting. °· If you develop constipation, eat more high-fiber foods, such as fresh vegetables or fruit and whole grains. Drink enough fluids to keep your urine clear or pale yellow. °Activity and Exercise °· Exercise only as directed by your health care provider. Exercising will help you: °¨ Control your weight. °¨ Stay in shape. °¨ Be prepared for labor and delivery. °· Experiencing pain or cramping in the lower abdomen or low back is a good sign that you should stop exercising. Check with your health care provider before continuing normal exercises. °· Try to avoid standing for long  periods of time. Move your legs often if you must stand in one place for a long time. °· Avoid heavy lifting. °· Wear low-heeled shoes, and practice good posture. °· You may continue to have sex unless your health care provider directs you otherwise. °Relief of Pain or Discomfort °· Wear a good support bra for breast tenderness.   °· Take warm sitz baths to soothe any pain or discomfort caused by hemorrhoids. Use hemorrhoid cream if your health care provider approves.   °· Rest with your legs elevated if you have leg cramps or low back pain. °· If you develop varicose veins in your legs, wear support hose. Elevate your feet for 15 minutes, 3-4 times a day. Limit salt in your diet. °Prenatal Care °· Schedule your prenatal visits by the twelfth week of pregnancy. They are usually scheduled monthly at first, then more often in the last 2 months before delivery. °· Write down your questions. Take them to your prenatal visits. °· Keep all your prenatal visits as directed by your   health care provider. Safety  Wear your seat belt at all times when driving.  Make a list of emergency phone numbers, including numbers for family, friends, the hospital, and police and fire departments. General Tips  Ask your health care provider for a referral to a local prenatal education class. Begin classes no later than at the beginning of month 6 of your pregnancy.  Ask for help if you have counseling or nutritional needs during pregnancy. Your health care provider can offer advice or refer you to specialists for help with various needs.  Do not use hot tubs, steam rooms, or saunas.  Do not douche or use tampons or scented sanitary pads.  Do not cross your legs for long periods of time.  Avoid cat litter boxes and soil used by cats. These carry germs that can cause birth defects in the baby and possibly loss of the fetus by miscarriage or stillbirth.  Avoid all smoking, herbs, alcohol, and medicines not prescribed by  your health care provider. Chemicals in these affect the formation and growth of the baby.  Do not use any tobacco products, including cigarettes, chewing tobacco, and electronic cigarettes. If you need help quitting, ask your health care provider. You may receive counseling support and other resources to help you quit.  Schedule a dentist appointment. At home, brush your teeth with a soft toothbrush and be gentle when you floss. SEEK MEDICAL CARE IF:   You have dizziness.  You have mild pelvic cramps, pelvic pressure, or nagging pain in the abdominal area.  You have persistent nausea, vomiting, or diarrhea.  You have a bad smelling vaginal discharge.  You have pain with urination.  You notice increased swelling in your face, hands, legs, or ankles. SEEK IMMEDIATE MEDICAL CARE IF:   You have a fever.  You are leaking fluid from your vagina.  You have spotting or bleeding from your vagina.  You have severe abdominal cramping or pain.  You have rapid weight gain or loss.  You vomit blood or material that looks like coffee grounds.  You are exposed to [place] measles and have never had them.  You are exposed to fifth disease or chickenpox.  You develop a severe headache.  You have shortness of breath.  You have any kind of trauma, such as from a fall or a car accident.   This information is not intended to replace advice given to you by your health care provider. Make sure you discuss any questions you have with your health care provider.   Document Released: 10/02/2001 Document Revised: 10/29/2014 Document Reviewed: 08/18/2013 Elsevier Interactive Patient Education 2016 Elsevier Inc. Rh Incompatibility Rh incompatibility is a condition that occurs during pregnancy if a woman has Rh-negative blood and her baby has Rh-positive blood. "Rh-negative" and "Rh-positive" refer to whether or not the blood has an Rh factor. An Rh factor is a specific protein found on the surface of  red blood cells. If a woman has Rh factor, she is Rh-positive. If she does not have an Rh factor, she is Rh-negative. Having or not having an Rh factor does not affect the mother's general health. However, it can cause problems during pregnancy.  WHAT KIND OF PROBLEMS CAN Rh INCOMPATIBILITY CAUSE? During pregnancy, blood from the baby can cross into the mother's bloodstream, especially during delivery. If a mother is Rh-negative and the baby is Rh-positive, the mother's defense system will react to the baby's blood as if it was a foreign substance and will create  proteins (antibodies). This is called sensitization. Once the mother is sensitized, her Rh antibodies will cross the placenta to the baby and attack the baby's Rh-positive blood as if it is a harmful substance.  Rh incompatibility can also happen if the Rh-negative pregnant woman is exposed to the Rh factor during a blood transfusion with Rh-positive blood.  HOW DOES THIS CONDITION AFFECT MY BABY? The Rh antibodies that attack and destroy the baby's red blood cells can lead to hemolytic disease in the baby. Hemolytic disease is when the red blood cells break down. This can cause:   Yellowing of the skin and eyes (jaundice).  The body to not have enough healthy red blood cells (anemia).   Brain damage.   Heart failure.   Death.  These antibodies usually do not cause problems during a first pregnancy. This is because the blood from the baby often times crosses into the mother's bloodstream during delivery, and the baby is born before many of the antibodies can develop. However, the antibodies stay in your body once they have formed. Because of this, Rh incompatibility is more likely to cause problems in second or later pregnancies (if the baby is Rh-positive).  HOW IS THIS CONDITION DIAGNOSED? When a woman becomes pregnant, blood tests may be done to find out her blood type and Rh factor. If the woman is Rh-negative, she also may have  another blood test called an antibody screen. The antibody screen shows whether she has Rh antibodies in her blood. If she does, it means she was exposed to Rh-positive blood before, and she is at risk for Rh incompatibility.  To find out whether the baby is developing hemolytic anemia and how serious it is, caregivers may use more advanced tests, such as ultrasonography (commonly known as ultrasound).  HOW IS Rh INCOMPATIBILITY TREATED?  Rh incompatibility is treated with a shot of medicine called Rho (D) immune globulin. This medicine keeps the woman's body from making antibodies that can cause serious problems in the baby or future babies.  Two shots will be given, one at around your seventh month of pregnancy and the other within 72 hours of your baby being born. If you are Rh-negative, you will need this medicine every time you have a baby with Rh-positive blood. If you already have antibodies in your blood, Rho (D) immune globulin will not help. Your doctor will not give you this medicine, but will watch your pregnancy closely for problems instead.  This shot may also be given to an Rh-negative woman when the risk of blood transfer between the mom and baby is high. The risk is high with:   An amniocentesis.   A miscarriage or an abortion.   An ectopic pregnancy.   Any vaginal bleeding during pregnancy.    This information is not intended to replace advice given to you by your health care provider. Make sure you discuss any questions you have with your health care provider.   Document Released: 03/30/2002 Document Revised: 10/13/2013 Document Reviewed: 01/20/2013 Elsevier Interactive Patient Education [employer]2016 Elsevier Inc.

## 2015-10-22 NOTE — MAU Note (Signed)
Pt noted bright red blood with wiping this a.m., had been spotting some on Thursday, started cramping yesterday.

## 2015-10-22 NOTE — MAU Provider Note (Signed)
History     CSN: [id number]  Arrival date and time: 10/22/15 1152   First Provider Initiated Contact with Patient 10/22/15 1541        Chief Complaint  Patient presents with  . Vaginal Bleeding  . Abdominal Pain   HPI Lauren [name] is a 24 y.o. G2P1001 at 2271w1d who presents for abdominal cramping & vaginal bleeding.  Reports episode of abdominal cramping yesterday at work after lifting heavy boxes. No pain since then. Reports bright red spotting on toilet paper this morning.  Last intercourse was several weeks ago.  Reports some nausea, no vomiting.  Denies diarrhea, constipation, urinary complaints, or vaginal discharge.  Plans on getting care with [facility], hasn't schedule appt yet.   OB History    Gravida Para Term Preterm AB TAB SAB Ectopic Multiple Living   2 1 1       1       Past Medical History  Diagnosis Date  . Asthma   . Allergy     allergic rhinitis  . Gestational diabetes   . Obesity     Past Surgical History  Procedure Laterality Date  . No past surgeries      Family History  Problem Relation Age of Onset  . Hypertension Mother   . Diabetes Father   . Cancer Other   . Asthma Other     Social History  Substance Use Topics  . Smoking status: Never Smoker   . Smokeless tobacco: None  . Alcohol Use: No    Allergies:  Allergies  Allergen Reactions  . Bee Venom Swelling  . Other Hives and Other (See Comments)    Pt states that she is allergic to Zaxby's zax sauce.    No prescriptions prior to admission    Review of Systems  Constitutional: Negative.   Gastrointestinal: Positive for nausea and abdominal pain. Negative for vomiting, diarrhea and constipation.  Genitourinary: Negative for dysuria.       + vaginal bleeding   Physical Exam   Blood pressure 132/81, pulse 95, temperature 98.5 F (36.9 C), temperature source Oral, resp. rate 18, last menstrual period 09/02/2015, currently breastfeeding.  Physical Exam  Nursing note and  vitals reviewed. Constitutional: She is oriented to person, place, and time. She appears well-developed and well-nourished. No distress.  HENT:  Head: Normocephalic and atraumatic.  Eyes: Conjunctivae are normal. Right eye exhibits no discharge. Left eye exhibits no discharge. No scleral icterus.  Neck: Normal range of motion.  Cardiovascular: Normal rate, regular rhythm and normal heart sounds.   No murmur heard. Respiratory: Effort normal and breath sounds normal. No respiratory distress. She has no wheezes.  GI: Soft. There is no tenderness.  Genitourinary: Cervix exhibits discharge (small amount of brown discharge. No new blood. ). Cervix exhibits no motion tenderness and no friability.  Cervix closed  Neurological: She is alert and oriented to person, place, and time.  Skin: Skin is warm and dry. She is not diaphoretic.  Psychiatric: She has a normal mood and affect. Her behavior is normal. Judgment and thought content normal.    MAU Course  Procedures Results for orders placed or performed during the hospital encounter of 10/22/15 (from the past 24 hour(s))  Urinalysis, Routine w reflex microscopic ([facility])     Status: Abnormal   Collection Time: 10/22/15 12:20 PM  Result Value Ref Range   Color, Urine YELLOW YELLOW   APPearance CLEAR CLEAR   Specific Gravity, Urine 1.020 1.005 - 1.030  pH 6.0 5.0 - 8.0   Glucose, UA NEGATIVE NEGATIVE mg/dL   Hgb urine dipstick LARGE (A) NEGATIVE   Bilirubin Urine NEGATIVE NEGATIVE   Ketones, ur 15 (A) NEGATIVE mg/dL   Protein, ur NEGATIVE NEGATIVE mg/dL   Nitrite NEGATIVE NEGATIVE   Leukocytes, UA TRACE (A) NEGATIVE  Urine microscopic-add on     Status: Abnormal   Collection Time: 10/22/15 12:20 PM  Result Value Ref Range   Squamous Epithelial / LPF 6-30 (A) NONE SEEN   WBC, UA 6-30 0 - 5 WBC/hpf   RBC / HPF 0-5 0 - 5 RBC/hpf   Bacteria, UA MANY (A) NONE SEEN  CBC     Status: Abnormal   Collection Time: 10/22/15  1:42 PM   Result Value Ref Range   WBC 5.7 4.0 - 10.5 K/uL   RBC 4.26 3.87 - 5.11 MIL/uL   Hemoglobin 11.6 (L) 12.0 - 15.0 g/dL   HCT 16.1 (L) 09.6 - 04.5 %   MCV 83.3 78.0 - 100.0 fL   MCH 27.2 26.0 - 34.0 pg   MCHC 32.7 30.0 - 36.0 g/dL   RDW 40.9 81.1 - 91.4 %   Platelets 254 150 - 400 K/uL  hCG, quantitative, pregnancy     Status: Abnormal   Collection Time: 10/22/15  1:42 PM  Result Value Ref Range   hCG, Beta Chain, Quant, S 49259 (H) <5 mIU/mL  Rh IG workup (includes ABO/Rh)     Status: None (Preliminary result)   Collection Time: 10/22/15  1:42 PM  Result Value Ref Range   Gestational Age(Wks) 7    ABO/RH(D) AB NEG    Antibody Screen NEG    Unit Number [id number]    Blood Component Type RHIG    Unit division 00    Status of Unit ISSUED    Transfusion Status OK TO TRANSFUSE    US Ob Comp Less 14 Wks  10/22/2015  CLINICAL DATA:  24 year old pregnant female with vaginal bleeding today. Estimated gestational age of [redacted] weeks 1 day by LMP. EXAM: OBSTETRIC <14 WK [place] AND TRANSVAGINAL OB US TECHNIQUE: Both transabdominal and transvaginal ultrasound examinations were performed for complete evaluation of the gestation as well as the maternal uterus, adnexal regions, and pelvic cul-de-sac. Transvaginal technique was performed to assess early pregnancy. COMPARISON:  None. FINDINGS: Intrauterine gestational sac: Visualized/normal in shape. Yolk sac:  Visualized Embryo:  Visualized Cardiac Activity: Visualized Heart Rate: 134  bpm CRL:  10  mm   7 w   1 d                  [place] EDC: 06/08/2016. Maternal uterus/adnexae: There is no evidence of subchorionic hemorrhage. The ovaries bilaterally are unremarkable. There is no evidence of free fluid or adnexal mass. IMPRESSION: Single living intrauterine gestation with estimated gestational age of [redacted] weeks 1 day by this ultrasound and LMP. No evidence of subchorionic hemorrhage. Electronically Signed   By: Harmon Pier M.D.   On: 10/22/2015 15:08   US Ob  Transvaginal  10/22/2015  CLINICAL DATA:  24 year old pregnant female with vaginal bleeding today. Estimated gestational age of [redacted] weeks 1 day by LMP. EXAM: OBSTETRIC <14 WK [place] AND TRANSVAGINAL OB US TECHNIQUE: Both transabdominal and transvaginal ultrasound examinations were performed for complete evaluation of the gestation as well as the maternal uterus, adnexal regions, and pelvic cul-de-sac. Transvaginal technique was performed to assess early pregnancy. COMPARISON:  None. FINDINGS: Intrauterine gestational sac: Visualized/normal in shape. Yolk sac:  Visualized Embryo:  Visualized Cardiac Activity: Visualized Heart Rate: 134  bpm CRL:  10  mm   7 w   1 d                  [place] EDC: 06/08/2016. Maternal uterus/adnexae: There is no evidence of subchorionic hemorrhage. The ovaries bilaterally are unremarkable. There is no evidence of free fluid or adnexal mass. IMPRESSION: Single living intrauterine gestation with estimated gestational age of [redacted] weeks 1 day by this ultrasound and LMP. No evidence of subchorionic hemorrhage. Electronically Signed   By: Harmon Pier M.D.   On: 10/22/2015 15:08    MDM AB negative - rhophylac given in MAU No blood on exam SIUP per ultrasound Assessment and Plan  A: 1. Vaginal bleeding in pregnancy, first trimester   2. Rh negative state in antepartum period, first trimester, not applicable or unspecified fetus   3. Normal IUP (intrauterine pregnancy) on prenatal ultrasound, first trimester     P: Discharge home Start care with [facility] Weight restrictions note given for work Ob urine culture pending  Judeth Horn, NP  10/22/2015, 3:41 PM

## 2015-10-23 LAB — RH IG WORKUP (INCLUDES ABO/RH)
ABO/RH(D): AB NEG
Antibody Screen: NEGATIVE
Gestational Age(Wks): 7
UNIT DIVISION: 0

## 2015-10-23 LAB — HIV ANTIBODY (ROUTINE TESTING W REFLEX): HIV Screen 4th Generation wRfx: NONREACTIVE

## 2015-10-23 NOTE — L&D Delivery Note (Signed)
Operative Delivery Note At 4:16 PM a viable female was delivered via Vaginal, Spontaneous Delivery.  Presentation: vertex; Position: Right,, Occiput,, Anterior; Station: +2.  Delivery of the head:  First maneuver: 06/07/2016  4:15 PM, [place] Second maneuver: 06/07/2016  4:16 PM, Suprapubic Pressure.  The shoulder dystocia was reduced with these manuevers.  [facility]  Thick meconium and nuchal cord times one(easily reduced) noted Third maneuver: ,   Fourth maneuver: ,   Fifth maneuver: ,   Sixth maneuver: ,    Verbal consent: unable to obtain verbal consent due to during delivery. .  APGAR: 7, 9; weight  .   Placenta status: , .   Cord:  with the following complications: .  Cord pH: not sent  Anesthesia:   Episiotomy: None Lacerations: None Suture Repair: no lacerations noted Est. Blood Loss (mL): 300  Mom to postpartum.  Baby to Couplet care / Skin to Skin.  Madalyne Husk A 06/07/2016, 5:05 PM

## 2015-10-25 LAB — CULTURE, OB URINE

## 2015-10-25 LAB — GC/CHLAMYDIA PROBE AMP (~~LOC~~) NOT AT ARMC
CHLAMYDIA, DNA PROBE: NEGATIVE
NEISSERIA GONORRHEA: NEGATIVE

## 2015-11-03 ENCOUNTER — Emergency Department (INDEPENDENT_AMBULATORY_CARE_PROVIDER_SITE_OTHER)
Admission: EM | Admit: 2015-11-03 | Discharge: 2015-11-03 | Disposition: A | Discharge status: Home / Self Care | Payer: Self-pay | Source: Home / Self Care | Attending: Family Medicine | Admitting: Family Medicine

## 2015-11-03 ENCOUNTER — Encounter (HOSPITAL_COMMUNITY): Payer: Self-pay | Admitting: *Deleted

## 2015-11-03 DIAGNOSIS — O219 Vomiting of pregnancy, unspecified: Secondary | ICD-10-CM

## 2015-11-03 NOTE — Discharge Instructions (Signed)
See your doctor if further problems. °

## 2015-11-03 NOTE — ED Notes (Signed)
Pt   Is  [redacted]  Weeks  Pregnant  She  Needs  A  Note  To  Go  Back to  Work   She  Had  Vomiting  Earlier  But is  Better  Now

## 2015-11-03 NOTE — ED Provider Notes (Signed)
CSN: [id number]     Arrival date & time 11/03/15  1359 History   First MD Initiated Contact with Patient 11/03/15 1545     Chief Complaint  Patient presents with  . Emesis During Pregnancy   (Consider location/radiation/quality/duration/timing/severity/associated sxs/prior Treatment) Patient is a 25 y.o. female presenting with female genitourinary complaint. The history is provided by the patient.  Female GU Problem This is a new problem. Episode onset: 6wk preg with am nausea better now, no ob at this time, needs rtw note. The problem has been resolved. Pertinent negatives include no abdominal pain.    Past Medical History  Diagnosis Date  . Asthma   . Allergy     allergic rhinitis  . Gestational diabetes   . Obesity    Past Surgical History  Procedure Laterality Date  . No past surgeries     Family History  Problem Relation Age of Onset  . Hypertension Mother   . Diabetes Father   . Cancer Other   . Asthma Other    Social History  Substance Use Topics  . Smoking status: Never Smoker   . Smokeless tobacco: None  . Alcohol Use: No   OB History    Gravida Para Term Preterm AB TAB SAB Ectopic Multiple Living   2 1 1       1      Review of Systems  Gastrointestinal: Positive for nausea and vomiting. Negative for abdominal pain.  Genitourinary: Negative.   All other systems reviewed and are negative.   Allergies  Bee venom and Other  Home Medications   Prior to Admission medications   Medication Sig Start Date End Date Taking? Authorizing Provider  promethazine (PHENERGAN) 25 MG tablet Take 1 tablet (25 mg total) by mouth every 6 (six) hours as needed for nausea or vomiting. 10/22/15   Judeth HornErin Lawrence, NP   Meds Ordered and Administered this Visit  Medications - No data to display  BP 131/83 mmHg  Pulse 80  Temp(Src) 98.1 F (36.7 C) (Oral)  Resp 16  SpO2 99%  LMP 09/02/2015 (Approximate) No data found.   Physical Exam  Constitutional: She is oriented  to person, place, and time. She appears well-developed and well-nourished. No distress.  Abdominal: Soft. Bowel sounds are normal. There is no tenderness.  Neurological: She is alert and oriented to person, place, and time.  Skin: Skin is warm and dry.  Nursing note and vitals reviewed.   ED Course  Procedures (including critical care time)  Labs Review Labs Reviewed - No data to display  Imaging Review No results found.   Visual Acuity Review  Right Eye Distance:   Left Eye Distance:   Bilateral Distance:    Right Eye Near:   Left Eye Near:    Bilateral Near:         MDM   1. Vomiting or nausea of pregnancy    Here for note for work.    Linna HoffJames D Kindl, MD 11/03/15 [phone]

## 2015-12-20 LAB — OB RESULTS CONSOLE GC/CHLAMYDIA: Gonorrhea: NEGATIVE

## 2016-03-22 ENCOUNTER — Encounter (HOSPITAL_COMMUNITY): Payer: Self-pay

## 2016-03-22 ENCOUNTER — Inpatient Hospital Stay (HOSPITAL_COMMUNITY)
Admission: AD | Admit: 2016-03-22 | Discharge: 2016-03-22 | Disposition: A | Discharge status: Home / Self Care | Payer: Medicaid Other | Source: Ambulatory Visit | Attending: Obstetrics & Gynecology | Admitting: Obstetrics & Gynecology

## 2016-03-22 DIAGNOSIS — R102 Pelvic and perineal pain: Secondary | ICD-10-CM | POA: Diagnosis not present

## 2016-03-22 DIAGNOSIS — Z3A28 28 weeks gestation of pregnancy: Secondary | ICD-10-CM | POA: Diagnosis not present

## 2016-03-22 DIAGNOSIS — O26893 Other specified pregnancy related conditions, third trimester: Secondary | ICD-10-CM | POA: Diagnosis not present

## 2016-03-22 DIAGNOSIS — N898 Other specified noninflammatory disorders of vagina: Secondary | ICD-10-CM | POA: Diagnosis not present

## 2016-03-22 DIAGNOSIS — N949 Unspecified condition associated with female genital organs and menstrual cycle: Secondary | ICD-10-CM | POA: Diagnosis not present

## 2016-03-22 DIAGNOSIS — R109 Unspecified abdominal pain: Secondary | ICD-10-CM | POA: Diagnosis present

## 2016-03-22 LAB — URINE MICROSCOPIC-ADD ON: RBC / HPF: NONE SEEN RBC/hpf (ref 0–5)

## 2016-03-22 LAB — URINALYSIS, ROUTINE W REFLEX MICROSCOPIC
BILIRUBIN URINE: NEGATIVE
Glucose, UA: 100 mg/dL — AB
Hgb urine dipstick: NEGATIVE
Ketones, ur: NEGATIVE mg/dL
NITRITE: NEGATIVE
PH: 6 (ref 5.0–8.0)
Protein, ur: NEGATIVE mg/dL
SPECIFIC GRAVITY, URINE: 1.01 (ref 1.005–1.030)

## 2016-03-22 LAB — AMNISURE RUPTURE OF MEMBRANE (ROM) NOT AT ARMC: AMNISURE: NEGATIVE

## 2016-03-22 NOTE — MAU Provider Note (Signed)
History     CSN: [id number]  Arrival date and time: 03/22/16 1158   First Provider Initiated Contact with Patient 03/22/16 1322      Chief Complaint  Patient presents with  . Abdominal Pain   HPI    Lauren [name] is a 25 y.o. female G54P1001 @ [redacted]w[redacted]d here today with abdominal pain, which has been present for a few weeks now. She was seen in the [facility] office this week and told that her cervix was closed. She became concerned today when the pelvic pain/pressure became for intense. The pain hurts worse when she is up walking around.   Nothing makes the pain better.   She has not tried anything for the pain.  + fetal movement. Leaking of fluid that started today; she urinated and when she laid back down "water came out".   OB History    Gravida Para Term Preterm AB TAB SAB Ectopic Multiple Living   Past Medical History  Diagnosis Date  . Asthma   . Allergy     allergic rhinitis  . Gestational diabetes   . Obesity     Past Surgical History  Procedure Laterality Date  . No past surgeries      Family History  Problem Relation Age of Onset  . Hypertension Mother   . Diabetes Father   . Cancer Other   . Asthma Other     Social History  Substance Use Topics  . Smoking status: Never Smoker   . Smokeless tobacco: Never Used  . Alcohol Use: No    Allergies:  Allergies  Allergen Reactions  . Bee Venom Swelling  . Other Hives and Other (See Comments)    Pt states that she is allergic to Zaxby's zax sauce.    Prescriptions prior to admission  Medication Sig Dispense Refill Last Dose  . promethazine (PHENERGAN) 25 MG tablet Take 1 tablet (25 mg total) by mouth every 6 (six) hours as needed for nausea or vomiting. 30 tablet 0    Results for orders placed or performed during the hospital encounter of 03/22/16 (from the past 48 hour(s))  Urinalysis, Routine w reflex microscopic (not at [facility])     Status: Abnormal   Collection Time: 03/22/16  12:20 PM  Result Value Ref Range   Color, Urine STRAW (A) YELLOW   APPearance HAZY (A) CLEAR   Specific Gravity, Urine 1.010 1.005 - 1.030   pH 6.0 5.0 - 8.0   Glucose, UA 100 (A) NEGATIVE mg/dL   Hgb urine dipstick NEGATIVE NEGATIVE   Bilirubin Urine NEGATIVE NEGATIVE   Ketones, ur NEGATIVE NEGATIVE mg/dL   Protein, ur NEGATIVE NEGATIVE mg/dL   Nitrite NEGATIVE NEGATIVE   Leukocytes, UA SMALL (A) NEGATIVE  Urine microscopic-add on     Status: Abnormal   Collection Time: 03/22/16 12:20 PM  Result Value Ref Range   Squamous Epithelial / LPF 0-5 (A) NONE SEEN   WBC, UA 0-5 0 - 5 WBC/hpf   RBC / HPF NONE SEEN 0 - 5 RBC/hpf   Bacteria, UA FEW (A) NONE SEEN  Amnisure rupture of membrane (rom)not at [facility]     Status: None   Collection Time: 03/22/16  1:40 PM  Result Value Ref Range   Amnisure ROM NEGATIVE    Review of Systems  Constitutional: Negative for fever and chills.  Gastrointestinal: Positive for abdominal pain.  Genitourinary: Negative for dysuria.  Physical Exam   Blood pressure 133/78, pulse 101, temperature 98.5 F (36.9 C), resp. rate 18, height 5' 6.5" (1.689 m), weight 260 lb (117.935 kg), last menstrual period 09/02/2015, currently breastfeeding.  Physical Exam  Constitutional: She is oriented to person, place, and time. She appears well-developed and well-nourished. No distress.  GI: Soft. She exhibits no distension. There is no tenderness. There is no rebound and no guarding.  Genitourinary:  Speculum exam: Vagina - Small amount of creamy, thin discharge, no odor. No pooling of fluid.  Cervix - No contact bleeding Bimanual exam: Cervix  Dilation: Closed Effacement (%): Thick Cervical Position: Posterior Exam by:: j. Katianna Mcclenney,np Fern slide, amnisure.  Chaperone present for exam.  Musculoskeletal: Normal range of motion.  Neurological: She is alert and oriented to person, place, and time.  Skin: Skin is warm. She is not diaphoretic.  Psychiatric: Her  behavior is normal.     Fetal Tracing: Baseline: 140 bpm  Variability: Moderate  Accelerations: 15x15 Decelerations: none Toco: quiet.    MAU Course  Procedures  None  MDM  Amnisure negative Fern slide negative  Dr. Sallye OberKulwa notified of patient's arrival. Discussed HPI, Labs, and results.   Assessment and Plan   A:  1. Round ligament pain   2. Vaginal discharge in third trimester, antepartum     P:  Discharge home in stable condition Strongly encouraged patient to wear a pregnancy support belt and to change positions slowly Patient to follow up with OB as scheduled or sooner if needed.  Return to MAU if symptoms worsen.    Duane LopeJennifer I Ashling Roane, NP 03/22/2016 2:41 PM

## 2016-03-22 NOTE — MAU Note (Signed)
Pt has been feeling lower abdominal pain for about two weeks. Pt was at MD office last week and the Md checked her cervix and told her everything was ok. Pt states she is feeling vaginal pressure today which is new. Pt had spotting one time on Tuesday when she wiped. Pt denies fluid leaking. Pt states baby is moving normally.

## 2016-03-22 NOTE — Discharge Instructions (Signed)
Round Ligament Pain During Pregnancy   Round ligament pain is a sharp pain or jabbing feeling often felt in the lower belly or groin area on one or both sides. It is one of the most common complaints during pregnancy and is considered a normal part of pregnancy. It is most often felt during the second trimester.   Here is what you need to know about round ligament pain, including some tips to help you feel better.   Causes of Round Ligament Pain   Several thick ligaments surround and support your womb (uterus) as it grows during pregnancy. One of them is called the round ligament.   The round ligament connects the front part of the womb to your groin, the area where your legs attach to your pelvis. The round ligament normally tightens and relaxes slowly.   As your baby and womb grow, the round ligament stretches. That makes it more likely to become strained.   Sudden movements can cause the ligament to tighten quickly, like a rubber band snapping. This causes a sudden and quick jabbing feeling.   Symptoms of Round Ligament Pain   Round ligament pain can be concerning and uncomfortable. But it is considered normal as your body changes during pregnancy.   The symptoms of round ligament pain include a sharp, sudden spasm in the belly. It usually affects the right side, but it may happen on both sides. The pain only lasts a few seconds.   Exercise may cause the pain, as will rapid movements such as:  sneezing  coughing  laughing  rolling over in bed  standing up too quickly   Treatment of Round Ligament Pain   Here are some tips that may help reduce your discomfort:   Pain relief. Take over-the-counter acetaminophen for pain, if necessary. Ask your doctor if this is OK.   Exercise. Get plenty of exercise to keep your stomach (core) muscles strong. Doing stretching exercises or prenatal yoga can be helpful. Ask your doctor which exercises are safe for you and your baby.   A helpful  exercise involves putting your hands and knees on the floor, lowering your head, and pushing your backside into the air.   Avoid sudden movements. Change positions slowly (such as standing up or sitting down) to avoid sudden movements that may cause stretching and pain.   Flex your hips. Bend and flex your hips before you cough, sneeze, or laugh to avoid pulling on the ligaments.   Apply warmth. A heating pad or warm bath may be helpful. Ask your doctor if this is OK. Extreme heat can be dangerous to the baby.   You should try to modify your daily activity level and avoid positions that may worsen the condition.   When to Call the Doctor/Midwife   Always tell your doctor or midwife about any type of pain you have during pregnancy. Round ligament pain is quick and doesn't last long.   Call your health care provider immediately if you have:  severe pain  fever  chills  pain on urination  difficulty walking   Belly pain during pregnancy can be due to many different causes. It is important for your doctor to rule out more serious conditions, including pregnancy complications such as placenta abruption or non-pregnancy illnesses such as:  Abdominal Pain During Pregnancy Abdominal pain is common in pregnancy. Most of the time, it does not cause harm. There are many causes of abdominal pain. Some causes are more serious than others. Some  of the causes of abdominal pain in pregnancy are easily diagnosed. Occasionally, the diagnosis takes time to understand. Other times, the cause is not determined. Abdominal pain can be a sign that something is very wrong with the pregnancy, or the pain may have nothing to do with the pregnancy at all. For this reason, always tell your health care provider if you have any abdominal discomfort. HOME CARE INSTRUCTIONS  Monitor your abdominal pain for any changes. The following actions may help to alleviate any discomfort you are experiencing:  Do not have sexual  intercourse or put anything in your vagina until your symptoms go away completely.  Get plenty of rest until your pain improves.  Drink clear fluids if you feel nauseous. Avoid solid food as long as you are uncomfortable or nauseous.  Only take over-the-counter or prescription medicine as directed by your health care provider.  Keep all follow-up appointments with your health care provider. SEEK IMMEDIATE MEDICAL CARE IF:  You are bleeding, leaking fluid, or passing tissue from the vagina.  You have increasing pain or cramping.  You have persistent vomiting.  You have painful or bloody urination.  You have a fever.  You notice a decrease in your baby's movements.  You have extreme weakness or feel faint.  You have shortness of breath, with or without abdominal pain.  You develop a severe headache with abdominal pain.  You have abnormal vaginal discharge with abdominal pain.  You have persistent diarrhea.  You have abdominal pain that continues even after rest, or gets worse. MAKE SURE YOU:   Understand these instructions.  Will watch your condition.  Will get help right away if you are not doing well or get worse.   This information is not intended to replace advice given to you by your health care provider. Make sure you discuss any questions you have with your health care provider.   Document Released: 10/08/2005 Document Revised: 07/29/2013 Document Reviewed: 05/07/2013 Elsevier Interactive Patient Education 2016 [employer]  inguinal hernia  appendicitis  stomach, liver, and kidney problems  Preterm labor pains may sometimes be mistaken for round ligament pain.

## 2016-05-08 LAB — OB RESULTS CONSOLE GBS: GBS: NEGATIVE

## 2016-05-11 ENCOUNTER — Inpatient Hospital Stay (HOSPITAL_COMMUNITY)
Admission: AD | Admit: 2016-05-11 | Discharge: 2016-05-11 | Disposition: A | Discharge status: Home / Self Care | Payer: Medicaid Other | Source: Ambulatory Visit | Attending: Obstetrics and Gynecology | Admitting: Obstetrics and Gynecology

## 2016-05-11 ENCOUNTER — Encounter (HOSPITAL_COMMUNITY): Payer: Self-pay | Admitting: *Deleted

## 2016-05-11 DIAGNOSIS — O99513 Diseases of the respiratory system complicating pregnancy, third trimester: Secondary | ICD-10-CM | POA: Insufficient documentation

## 2016-05-11 DIAGNOSIS — Z8249 Family history of ischemic heart disease and other diseases of the circulatory system: Secondary | ICD-10-CM | POA: Diagnosis not present

## 2016-05-11 DIAGNOSIS — N949 Unspecified condition associated with female genital organs and menstrual cycle: Secondary | ICD-10-CM

## 2016-05-11 DIAGNOSIS — O479 False labor, unspecified: Secondary | ICD-10-CM

## 2016-05-11 DIAGNOSIS — Z833 Family history of diabetes mellitus: Secondary | ICD-10-CM | POA: Insufficient documentation

## 2016-05-11 DIAGNOSIS — O99213 Obesity complicating pregnancy, third trimester: Secondary | ICD-10-CM | POA: Diagnosis not present

## 2016-05-11 DIAGNOSIS — Z3A36 36 weeks gestation of pregnancy: Secondary | ICD-10-CM | POA: Insufficient documentation

## 2016-05-11 DIAGNOSIS — O26893 Other specified pregnancy related conditions, third trimester: Secondary | ICD-10-CM | POA: Diagnosis present

## 2016-05-11 DIAGNOSIS — O4703 False labor before 37 completed weeks of gestation, third trimester: Secondary | ICD-10-CM | POA: Diagnosis not present

## 2016-05-11 DIAGNOSIS — O24419 Gestational diabetes mellitus in pregnancy, unspecified control: Secondary | ICD-10-CM | POA: Diagnosis not present

## 2016-05-11 DIAGNOSIS — R109 Unspecified abdominal pain: Secondary | ICD-10-CM | POA: Diagnosis present

## 2016-05-11 LAB — URINALYSIS, ROUTINE W REFLEX MICROSCOPIC
Bilirubin Urine: NEGATIVE
GLUCOSE, UA: NEGATIVE mg/dL
Hgb urine dipstick: NEGATIVE
KETONES UR: 15 mg/dL — AB
Nitrite: NEGATIVE
PROTEIN: 30 mg/dL — AB
Specific Gravity, Urine: 1.025 (ref 1.005–1.030)
pH: 6 (ref 5.0–8.0)

## 2016-05-11 LAB — URINE MICROSCOPIC-ADD ON: RBC / HPF: NONE SEEN RBC/hpf (ref 0–5)

## 2016-05-11 NOTE — MAU Provider Note (Signed)
History     CSN: [id number]  Arrival date and time: 05/11/16 1115   First Provider Initiated Contact with Patient 05/11/16 1221      Chief Complaint  Patient presents with  . Abdominal Cramping   HPI   Lauren [name] is a 25 y.o. female G2P1001 @ 2232w0d here with abdominal cramping that has been present X 2 weeks. Here in MAU unannounced.   The abdominal pain is located all throughout her lower abdomen. The pain worsens when she is up moving around and at work. While she is sitting here in MAU she does not have any pain.   Denies bleeding or leaking of fluid + fetal movement.   Next appointment in the office is Tuesday.   OB History    Gravida Para Term Preterm AB TAB SAB Ectopic Multiple Living   2 1 1       1       Past Medical History  Diagnosis Date  . Asthma   . Allergy     allergic rhinitis  . Gestational diabetes   . Obesity     Past Surgical History  Procedure Laterality Date  . No past surgeries    . Wisdom tooth extraction      Family History  Problem Relation Age of Onset  . Hypertension Mother   . Diabetes Father   . Cancer Other   . Asthma Other     Social History  Substance Use Topics  . Smoking status: Never Smoker   . Smokeless tobacco: Never Used  . Alcohol Use: No    Allergies:  Allergies  Allergen Reactions  . Bee Venom Swelling  . Other Hives and Other (See Comments)    Pt states that she is allergic to Zaxby's zax sauce.    Prescriptions prior to admission  Medication Sig Dispense Refill Last Dose  . Prenatal Vit-Fe Fumarate-FA (PRENATAL MULTIVITAMIN) TABS tablet Take 1 tablet by mouth daily at 12 noon.   03/21/2016 at Unknown time  . promethazine (PHENERGAN) 25 MG tablet Take 1 tablet (25 mg total) by mouth every 6 (six) hours as needed for nausea or vomiting. (Patient not taking: Reported on 03/22/2016) 30 tablet 0     Results for orders placed or performed during the hospital encounter of 05/11/16 (from the past 48  hour(s))  Urinalysis, Routine w reflex microscopic ([facility])     Status: Abnormal   Collection Time: 05/11/16 11:20 AM  Result Value Ref Range   Color, Urine YELLOW YELLOW   APPearance CLOUDY (A) CLEAR   Specific Gravity, Urine 1.025 1.005 - 1.030   pH 6.0 5.0 - 8.0   Glucose, UA NEGATIVE NEGATIVE mg/dL   Hgb urine dipstick NEGATIVE NEGATIVE   Bilirubin Urine NEGATIVE NEGATIVE   Ketones, ur 15 (A) NEGATIVE mg/dL   Protein, ur 30 (A) NEGATIVE mg/dL   Nitrite NEGATIVE NEGATIVE   Leukocytes, UA TRACE (A) NEGATIVE  Urine microscopic-add on     Status: Abnormal   Collection Time: 05/11/16 11:20 AM  Result Value Ref Range   Squamous Epithelial / LPF 6-30 (A) NONE SEEN   WBC, UA 0-5 0 - 5 WBC/hpf   RBC / HPF NONE SEEN 0 - 5 RBC/hpf   Bacteria, UA FEW (A) NONE SEEN    Review of Systems  Constitutional: Negative for fever and chills.  Genitourinary: Negative for dysuria, urgency, frequency, hematuria and flank pain.   Physical Exam   Blood pressure 127/77, pulse 77, temperature 98.6  F (37 C), temperature source Oral, resp. rate 18, height 5' 5.5" (1.664 m), weight 281 lb (127.461 kg), last menstrual period 09/02/2015, SpO2 100 %, currently breastfeeding.  Physical Exam  Constitutional: She is oriented to person, place, and time. She appears well-developed and well-nourished. No distress.  HENT:  Head: Normocephalic.  Eyes: Pupils are equal, round, and reactive to light.  Respiratory: Effort normal.  Musculoskeletal: Normal range of motion.  Neurological: She is alert and oriented to person, place, and time.  Skin: Skin is warm. She is not diaphoretic.  Psychiatric: Her behavior is normal.   Fetal Tracing: Baseline: 125 bpm  Variability: Moderate  Accelerations: 15x15 Decelerations: None Toco: One contraction noted   Dilation: Fingertip Effacement (%): Thick Cervical Position: Posterior Exam by:: Leafy Ro, RNC   MAU Course  Procedures   None  MDM  Discussed patient with Dr. Su Hilt.   Assessment and Plan   A:  1. Braxton Hicks contractions   2. Round ligament pain     P:  Discharge home in stable condition Preterm labor precautions Pregnancy support belt recommended  Follow up with OB as scheduled Return to MAU if symptoms worsen Increase PO fluids.    Duane Lope, NP 05/11/2016 9:43 PM

## 2016-05-11 NOTE — MAU Note (Signed)
Patient has been having abdominal cramping on and off for past two weeks and last night they were 8/10.  Now they are 4/10.  Denies LOF or vaginal bleeding.  Reports +fetal movement.

## 2016-05-11 NOTE — Discharge Instructions (Signed)
Braxton Hicks Contractions °Contractions of the uterus can occur throughout pregnancy. Contractions are not always a sign that you are in labor.  °WHAT ARE BRAXTON HICKS CONTRACTIONS?  °Contractions that occur before labor are called Braxton Hicks contractions, or false labor. Toward the end of pregnancy (32-34 weeks), these contractions can develop more often and may become more forceful. This is not true labor because these contractions do not result in opening (dilatation) and thinning of the cervix. They are sometimes difficult to tell apart from true labor because these contractions can be forceful and people have different pain tolerances. You should not feel embarrassed if you go to the hospital with false labor. Sometimes, the only way to tell if you are in true labor is for your health care provider to look for changes in the cervix. °If there are no prenatal problems or other health problems associated with the pregnancy, it is completely safe to be sent home with false labor and await the onset of true labor. °HOW CAN YOU TELL THE DIFFERENCE BETWEEN TRUE AND FALSE LABOR? °False Labor °· The contractions of false labor are usually shorter and not as hard as those of true labor.   °· The contractions are usually irregular.   °· The contractions are often felt in the front of the lower abdomen and in the groin.   °· The contractions may go away when you walk around or change positions while lying down.   °· The contractions get weaker and are shorter lasting as time goes on.   °· The contractions do not usually become progressively stronger, regular, and closer together as with true labor.   °True Labor °· Contractions in true labor last 30-70 seconds, become very regular, usually become more intense, and increase in frequency.   °· The contractions do not go away with walking.   °· The discomfort is usually felt in the top of the uterus and spreads to the lower abdomen and low back.   °· True labor can be  determined by your health care provider with an exam. This will show that the cervix is dilating and getting thinner.   °WHAT TO REMEMBER °· Keep up with your usual exercises and follow other instructions given by your health care provider.   °· Take medicines as directed by your health care provider.   °· Keep your regular prenatal appointments.   °· Eat and drink lightly if you think you are going into labor.   °· If Braxton Hicks contractions are making you uncomfortable:   °¨ Change your position from lying down or resting to walking, or from walking to resting.   °¨ Sit and rest in a tub of warm water.   °¨ Drink 2-3 glasses of water. Dehydration may cause these contractions.   °¨ Do slow and deep breathing several times an hour.   °WHEN SHOULD I SEEK IMMEDIATE MEDICAL CARE? °Seek immediate medical care if: °· Your contractions become stronger, more regular, and closer together.   °· You have fluid leaking or gushing from your vagina.   °· You have a fever.   °· You pass blood-tinged mucus.   °· You have vaginal bleeding.   °· You have continuous abdominal pain.   °· You have low back pain that you never had before.   °· You feel your baby's head pushing down and causing pelvic pressure.   °· Your baby is not moving as much as it used to.   °  °This information is not intended to replace advice given to you by your health care provider. Make sure you discuss any questions you have with your health care   provider.   Document Released: 10/08/2005 Document Revised: 10/13/2013 Document Reviewed: 07/20/2013 Elsevier Interactive Patient Education 2016 [employer]   Round Ligament Pain During Pregnancy   Round ligament pain is a sharp pain or jabbing feeling often felt in the lower belly or groin area on one or both sides. It is one of the most common complaints during pregnancy and is considered a normal part of pregnancy. It is most often felt during the second trimester.   Here is what you need to know  about round ligament pain, including some tips to help you feel better.   Causes of Round Ligament Pain:    Several thick ligaments surround and support your womb (uterus) as it grows during pregnancy. One of them is called the round ligament.   The round ligament connects the front part of the womb to your groin, the area where your legs attach to your pelvis. The round ligament normally tightens and relaxes slowly.   As your baby and womb grow, the round ligament stretches. That makes it more likely to become strained.   Sudden movements can cause the ligament to tighten quickly, like a rubber band snapping. This causes a sudden and quick jabbing feeling.   Symptoms of Round Ligament Pain   Round ligament pain can be concerning and uncomfortable. But it is considered normal as your body changes during pregnancy.   The symptoms of round ligament pain include a sharp, sudden spasm in the belly. It usually affects the right side, but it may happen on both sides. The pain only lasts a few seconds.   Exercise may cause the pain, as will rapid movements such as:   sneezing  coughing  laughing  rolling over in bed  standing up too quickly   Treatment of Round Ligament Pain   Here are some tips that may help reduce your discomfort:   Pain relief. Take over-the-counter acetaminophen for pain, if necessary. Ask your doctor if this is OK.   Exercise. Get plenty of exercise to keep your stomach (core) muscles strong. Doing stretching exercises or prenatal yoga can be helpful. Ask your doctor which exercises are safe for you and your baby.   A helpful exercise involves putting your hands and knees on the floor, lowering your head, and pushing your backside into the air.   Avoid sudden movements. Change positions slowly (such as standing up or sitting down) to avoid sudden movements that may cause stretching and pain.   Flex your hips. Bend and flex your hips before you cough, sneeze, or  laugh to avoid pulling on the ligaments.   Apply warmth. A heating pad or warm bath may be helpful. Ask your doctor if this is OK. Extreme heat can be dangerous to the baby.   You should try to modify your daily activity level and avoid positions that may worsen the condition.   When to Call the Doctor/Midwife   Always tell your doctor or midwife about any type of pain you have during pregnancy. Round ligament pain is quick and doesn't last long.   Call your health care provider immediately if you have:   severe pain  fever  chills  pain on urination  difficulty walking   Belly pain during pregnancy can be due to many different causes. It is important for your doctor to rule out more serious conditions, including pregnancy complications such as placenta abruption or non-pregnancy illnesses such as:   inguinal hernia  appendicitis  stomach, liver, and  kidney problems  Preterm labor pains may sometimes be mistaken for round ligament pain.

## 2016-05-27 ENCOUNTER — Inpatient Hospital Stay (HOSPITAL_COMMUNITY)
Admission: AD | Admit: 2016-05-27 | Discharge: 2016-05-27 | Disposition: A | Discharge status: Home / Self Care | Payer: Medicaid Other | Source: Ambulatory Visit | Attending: Obstetrics and Gynecology | Admitting: Obstetrics and Gynecology

## 2016-05-27 ENCOUNTER — Encounter (HOSPITAL_COMMUNITY): Payer: Self-pay | Admitting: *Deleted

## 2016-05-27 DIAGNOSIS — Z3483 Encounter for supervision of other normal pregnancy, third trimester: Secondary | ICD-10-CM | POA: Diagnosis not present

## 2016-05-27 NOTE — Progress Notes (Signed)
Telephone call from Dr Charlotta Newtonzan, discussed pt presenting complaints, Fetal heart tracing and cervical exam, pt may d/c home with labor precautions

## 2016-05-27 NOTE — Progress Notes (Signed)
VM left for Dr Charlotta Newtonozan

## 2016-05-27 NOTE — MAU Note (Signed)
Contracting last night reached out to midwife, decided not to come to hospital pain was to bad,

## 2016-05-31 ENCOUNTER — Encounter (HOSPITAL_COMMUNITY): Payer: Self-pay

## 2016-05-31 ENCOUNTER — Inpatient Hospital Stay (HOSPITAL_COMMUNITY)
Admission: AD | Admit: 2016-05-31 | Discharge: 2016-05-31 | Disposition: A | Discharge status: Home / Self Care | Payer: Medicaid Other | Source: Ambulatory Visit | Attending: Obstetrics & Gynecology | Admitting: Obstetrics & Gynecology

## 2016-05-31 DIAGNOSIS — Z3490 Encounter for supervision of normal pregnancy, unspecified, unspecified trimester: Secondary | ICD-10-CM | POA: Diagnosis present

## 2016-05-31 LAB — POCT FERN TEST: POCT Fern Test: NEGATIVE

## 2016-05-31 LAB — AMNISURE RUPTURE OF MEMBRANE (ROM) NOT AT ARMC: Amnisure ROM: NEGATIVE

## 2016-06-07 ENCOUNTER — Inpatient Hospital Stay (HOSPITAL_COMMUNITY)
Admission: AD | Admit: 2016-06-07 | Discharge: 2016-06-08 | DRG: 775 | Disposition: A | Discharge status: Home / Self Care | Payer: Medicaid Other | Source: Ambulatory Visit | Attending: Obstetrics and Gynecology | Admitting: Obstetrics and Gynecology

## 2016-06-07 ENCOUNTER — Encounter (HOSPITAL_COMMUNITY): Payer: Self-pay

## 2016-06-07 DIAGNOSIS — O99214 Obesity complicating childbirth: Secondary | ICD-10-CM | POA: Diagnosis present

## 2016-06-07 DIAGNOSIS — Z3483 Encounter for supervision of other normal pregnancy, third trimester: Secondary | ICD-10-CM | POA: Diagnosis present

## 2016-06-07 DIAGNOSIS — Z3A40 40 weeks gestation of pregnancy: Secondary | ICD-10-CM | POA: Diagnosis not present

## 2016-06-07 LAB — URIC ACID: Uric Acid, Serum: 4.3 mg/dL (ref 2.3–6.6)

## 2016-06-07 LAB — COMPREHENSIVE METABOLIC PANEL
ALK PHOS: 115 U/L (ref 38–126)
ALT: 10 U/L — AB (ref 14–54)
ANION GAP: 8 (ref 5–15)
AST: 13 U/L — ABNORMAL LOW (ref 15–41)
Albumin: 2.9 g/dL — ABNORMAL LOW (ref 3.5–5.0)
BUN: 9 mg/dL (ref 6–20)
CALCIUM: 8.9 mg/dL (ref 8.9–10.3)
CO2: 19 mmol/L — AB (ref 22–32)
CREATININE: 0.65 mg/dL (ref 0.44–1.00)
Chloride: 106 mmol/L (ref 101–111)
Glucose, Bld: 128 mg/dL — ABNORMAL HIGH (ref 65–99)
Potassium: 4.4 mmol/L (ref 3.5–5.1)
SODIUM: 133 mmol/L — AB (ref 135–145)
TOTAL PROTEIN: 6.5 g/dL (ref 6.5–8.1)
Total Bilirubin: 0.3 mg/dL (ref 0.3–1.2)

## 2016-06-07 LAB — CBC
HCT: 36.3 % (ref 36.0–46.0)
HEMOGLOBIN: 12.2 g/dL (ref 12.0–15.0)
MCH: 26.9 pg (ref 26.0–34.0)
MCHC: 33.6 g/dL (ref 30.0–36.0)
MCV: 80 fL (ref 78.0–100.0)
Platelets: 205 10*3/uL (ref 150–400)
RBC: 4.54 MIL/uL (ref 3.87–5.11)
RDW: 15.3 % (ref 11.5–15.5)
WBC: 8.8 10*3/uL (ref 4.0–10.5)

## 2016-06-07 LAB — LACTATE DEHYDROGENASE: LDH: 167 U/L (ref 98–192)

## 2016-06-07 MED ORDER — MEASLES, MUMPS & RUBELLA VAC ~~LOC~~ INJ
0.5000 mL | INJECTION | Freq: Once | SUBCUTANEOUS | Status: DC
  Filled 2016-06-07: qty 0.5

## 2016-06-07 MED ORDER — SIMETHICONE 80 MG PO CHEW: 80.0000 mg | CHEWABLE_TABLET | ORAL | Status: DC | PRN

## 2016-06-07 MED ORDER — OXYCODONE-ACETAMINOPHEN 5-325 MG PO TABS: 1.0000 | ORAL_TABLET | ORAL | Status: DC | PRN

## 2016-06-07 MED ORDER — BENZOCAINE-MENTHOL 20-0.5 % EX AERO: 1.0000 "application " | INHALATION_SPRAY | CUTANEOUS | Status: DC | PRN

## 2016-06-07 MED ORDER — ONDANSETRON HCL 4 MG/2ML IJ SOLN: 4.0000 mg | INTRAMUSCULAR | Status: DC | PRN

## 2016-06-07 MED ORDER — LACTATED RINGERS IV SOLN
INTRAVENOUS | Status: DC
  Administered 2016-06-07: 14:00:00 via INTRAVENOUS

## 2016-06-07 MED ORDER — OXYCODONE-ACETAMINOPHEN 5-325 MG PO TABS: 2.0000 | ORAL_TABLET | ORAL | Status: DC | PRN

## 2016-06-07 MED ORDER — COCONUT OIL OIL: 1.0000 "application " | TOPICAL_OIL | Status: DC | PRN

## 2016-06-07 MED ORDER — ACETAMINOPHEN 325 MG PO TABS: 650.0000 mg | ORAL_TABLET | ORAL | Status: DC | PRN

## 2016-06-07 MED ORDER — TETANUS-DIPHTH-ACELL PERTUSSIS 5-2.5-18.5 LF-MCG/0.5 IM SUSP: 0.5000 mL | Freq: Once | INTRAMUSCULAR | Status: DC

## 2016-06-07 MED ORDER — ONDANSETRON HCL 4 MG PO TABS: 4.0000 mg | ORAL_TABLET | ORAL | Status: DC | PRN

## 2016-06-07 MED ORDER — SOD CITRATE-CITRIC ACID 500-334 MG/5ML PO SOLN: 30.0000 mL | ORAL | Status: DC | PRN

## 2016-06-07 MED ORDER — ZOLPIDEM TARTRATE 5 MG PO TABS: 5.0000 mg | ORAL_TABLET | Freq: Every evening | ORAL | Status: DC | PRN

## 2016-06-07 MED ORDER — DIPHENHYDRAMINE HCL 25 MG PO CAPS: 25.0000 mg | ORAL_CAPSULE | Freq: Four times a day (QID) | ORAL | Status: DC | PRN

## 2016-06-07 MED ORDER — MEDROXYPROGESTERONE ACETATE 150 MG/ML IM SUSP: 150.0000 mg | INTRAMUSCULAR | Status: DC | PRN

## 2016-06-07 MED ORDER — SENNOSIDES-DOCUSATE SODIUM 8.6-50 MG PO TABS
2.0000 | ORAL_TABLET | ORAL | Status: DC
  Administered 2016-06-08: 2 via ORAL
  Filled 2016-06-07: qty 2

## 2016-06-07 MED ORDER — LACTATED RINGERS IV SOLN: 500.0000 mL | INTRAVENOUS | Status: DC | PRN

## 2016-06-07 MED ORDER — ONDANSETRON HCL 4 MG/2ML IJ SOLN: 4.0000 mg | Freq: Four times a day (QID) | INTRAMUSCULAR | Status: DC | PRN

## 2016-06-07 MED ORDER — FENTANYL CITRATE (PF) 100 MCG/2ML IJ SOLN: 100.0000 ug | INTRAMUSCULAR | Status: DC | PRN

## 2016-06-07 MED ORDER — FLEET ENEMA 7-19 GM/118ML RE ENEM: 1.0000 | ENEMA | RECTAL | Status: DC | PRN

## 2016-06-07 MED ORDER — IBUPROFEN 600 MG PO TABS
600.0000 mg | ORAL_TABLET | Freq: Four times a day (QID) | ORAL | Status: DC
  Administered 2016-06-07 – 2016-06-08 (×4): 600 mg via ORAL
  Filled 2016-06-07 (×4): qty 1

## 2016-06-07 MED ORDER — OXYTOCIN 40 UNITS IN LACTATED RINGERS INFUSION - SIMPLE MED
2.5000 [IU]/h | INTRAVENOUS | Status: DC
  Administered 2016-06-07: 2.5 [IU]/h via INTRAVENOUS
  Filled 2016-06-07: qty 1000

## 2016-06-07 MED ORDER — OXYTOCIN BOLUS FROM INFUSION
500.0000 mL | Freq: Once | INTRAVENOUS | Status: AC
  Administered 2016-06-07: 500 mL via INTRAVENOUS

## 2016-06-07 MED ORDER — PRENATAL MULTIVITAMIN CH
1.0000 | ORAL_TABLET | Freq: Every day | ORAL | Status: DC
  Administered 2016-06-08: 1 via ORAL
  Filled 2016-06-07: qty 1

## 2016-06-07 MED ORDER — LIDOCAINE HCL (PF) 1 % IJ SOLN
30.0000 mL | INTRAMUSCULAR | Status: DC | PRN
  Filled 2016-06-07: qty 30

## 2016-06-07 MED ORDER — DIBUCAINE 1 % RE OINT: 1.0000 "application " | TOPICAL_OINTMENT | RECTAL | Status: DC | PRN

## 2016-06-07 MED ORDER — WITCH HAZEL-GLYCERIN EX PADS: 1.0000 "application " | MEDICATED_PAD | CUTANEOUS | Status: DC | PRN

## 2016-06-07 NOTE — MAU Note (Signed)
Ctx that started at 9 am, 5-7 minutes apart.

## 2016-06-07 NOTE — H&P (Signed)
Subjective:  Lauren [name] is a 25 y.o. G2 60P1 female with EDC [birth year] at 3640 and 0/[redacted] weeks gestation who is being admitted for labor management.  Her current obstetrical history is significant for that she denies HA, blurred vision or RUQ pain.  Patient reports pain from contractions.   Fetal Movement: normal.     Objective:   Vital signs in last 24 hours: Temp:  [98.2 F (36.8 C)-99.2 F (37.3 C)] 99.2 F (37.3 C) (08/17 1628) Pulse Rate:  [83-98] 97 (08/17 1646) Resp:  [18-20] 20 (08/17 1646) BP: (137-154)/(88-94) 139/91 (08/17 1646) Weight:  [283 lb (128.4 kg)] 283 lb (128.4 kg) (08/17 1328)   General:   alert and cooperative  Skin:   normal  HEENT:  PERRLA  Lungs:   clear to auscultation bilaterally  Heart:   regular rate and rhythm  Breasts:   def  Abdomen:  soft, non-tender; bowel sounds normal; no masses,  no organomegaly  Pelvis:  WNL  FHT:  130 BPM  Uterine Size: 41 cm  Presentations: cephalic  Cervix:    Dilation: 5   Effacement: 75%   Station:  -3   Consistency: soft   Position: anterior   Lab Review  AB, Rh -  ZOX:WRUEAVWAFP:patient declined  One hour GTT: Normal    Assessment/Plan:  40 and 0/[redacted] weeks gestation. Active phase labor. Obstetrical history significant for obesity.     Risks, benefits, alternatives and possible complications have been discussed in detail with the patient.  Pre-admission, admission, and post admission procedures and expectations were discussed in detail.  All questions answered, all appropriate consents will be signed at the Hospital. Admission is planned for today.  Anticipate vaginal delivery.

## 2016-06-07 NOTE — Anesthesia Pain Management Evaluation Note (Signed)
  [facility]NA Pain Management Visit Note  Patient: Lauren [name], 25 y.o., female  "Hello I am a member of the anesthesia team at [facility]. We have an anesthesia team available at all times to provide care throughout the hospital, including epidural management and anesthesia for C-section. I don't know your plan for the delivery whether it a natural birth, water birth, IV sedation, nitrous supplementation, doula or epidural, but we want to meet your pain goals."   1.Was your pain managed to your expectations on prior hospitalizations?   Yes   2.What is your expectation for pain management during this hospitalization?     Labor support without medications  3.How can we help you reach that goal? Be available if needed  Record the patient's initial score and the patient's pain goal.   Pain: 10  Pain Goal: 10 [facility] wants you to be able to say your pain was always managed very well.  Lauren [name] 06/07/2016   Patient wanting natural birth with no intervention.

## 2016-06-07 NOTE — Lactation Note (Signed)
This note was copied from a baby's chart. Lactation Consultation Note  Baby was cueing while in  Dad's arms but quieted down and did not sustain latch during this consult. Mother reports that baby has been feeding well for brief periods. Reviewed cue based feeding and hand expression. Information given on support groups and outpatient services.  Patient Name: Lauren Manfred ShirtsBelinda [name] ZOXWR'UToday's Date: 06/07/2016 Reason for consult: Initial assessment   Maternal Data Has patient been taught Hand Expression?: Yes Does the patient have breastfeeding experience prior to this delivery?: Yes  Feeding Feeding Type: Breast Fed Length of feed:  (off and on)  LATCH Score/Interventions Latch: Repeated attempts needed to sustain latch, nipple held in mouth throughout feeding, stimulation needed to elicit sucking reflex.  Audible Swallowing: A few with stimulation  Type of Nipple: Everted at rest and after stimulation  Comfort (Breast/Nipple): Soft / non-tender     Hold (Positioning): Assistance needed to correctly position infant at breast and maintain latch.  LATCH Score: 7  Lactation Tools Discussed/Used     Consult Status Consult Status: Follow-up Date: 06/08/16 Follow-up type: In-patient    Soyla DryerJoseph, Naryah Clenney 06/07/2016, 10:42 PM

## 2016-06-08 LAB — CBC
HCT: 32.6 % — ABNORMAL LOW (ref 36.0–46.0)
HEMOGLOBIN: 10.9 g/dL — AB (ref 12.0–15.0)
MCH: 26.8 pg (ref 26.0–34.0)
MCHC: 33.4 g/dL (ref 30.0–36.0)
MCV: 80.1 fL (ref 78.0–100.0)
Platelets: 187 10*3/uL (ref 150–400)
RBC: 4.07 MIL/uL (ref 3.87–5.11)
RDW: 15.5 % (ref 11.5–15.5)
WBC: 12.4 10*3/uL — ABNORMAL HIGH (ref 4.0–10.5)

## 2016-06-08 LAB — PROTEIN / CREATININE RATIO, URINE
CREATININE, URINE: 117 mg/dL
Protein Creatinine Ratio: 0.21 mg/mg{Cre} — ABNORMAL HIGH (ref 0.00–0.15)
Total Protein, Urine: 24 mg/dL

## 2016-06-08 LAB — RPR: RPR: NONREACTIVE

## 2016-06-08 MED ORDER — IBUPROFEN 600 MG PO TABS: 600.0000 mg | ORAL_TABLET | Freq: Four times a day (QID) | ORAL | 0 refills | Status: DC

## 2016-06-08 MED ORDER — RHO D IMMUNE GLOBULIN 1500 UNIT/2ML IJ SOSY
300.0000 ug | PREFILLED_SYRINGE | Freq: Once | INTRAMUSCULAR | Status: AC
  Administered 2016-06-08: 300 ug via INTRAVENOUS
  Filled 2016-06-08: qty 2

## 2016-06-08 NOTE — Progress Notes (Signed)
UR chart review completed.  

## 2016-06-08 NOTE — Discharge Summary (Signed)
Obstetric Discharge Summary Reason for Admission: onset of labor Prenatal Procedures: ultrasound Intrapartum Procedures: spontaneous vaginal delivery Postpartum Procedures: none Complications-Operative and Postpartum: none Hemoglobin  Date Value Ref Range Status  06/08/2016 10.9 (L) 12.0 - 15.0 g/dL Final   HCT  Date Value Ref Range Status  06/08/2016 32.6 (L) 36.0 - 46.0 % Final    Physical Exam:  General: alert and cooperative Lochia: appropriate Uterine Fundus: firm Incision: na DVT Evaluation: Negative Homan's sign. cv RRR Lungs CTA B Discharge Diagnoses: Term Pregnancy-delivered.  Pt had some elevated blood pressures. Her PCR was normal and blood pressures started to normalize.    Discharge Information: Date: 06/08/2016 Activity: pelvic rest Diet: routine Medications: PNV and Ibuprofen Condition: stable Instructions: refer to practice specific booklet Discharge to: home Follow-up Information    [facility]. Schedule an appointment as soon as possible for a visit in 6 week(s).   Specialty:  Obstetrics and Gynecology Contact information: [address] [place] [phone]          Newborn Data: Live born female  Birth Weight: 7 lb 15.2 oz (3605 g) APGAR: 7, 9  Home with mother.  Lauren [name] A 06/08/2016, 11:41 AM

## 2016-06-08 NOTE — Lactation Note (Signed)
This note was copied from a baby's chart. Lactation Consultation Note  Patient Name: Lauren [name] ZOXWR'UToday's Date: 06/08/2016 Reason for consult: Follow-up assessment;Other (Comment) (mom has changed to just  pump and bottle feeding / MBU RN set up a DEBP and mom pumped x1 / baby be D/C today )  When LC walked in the room MBU RN was feeding baby 1st bottle and baby was very sluggish taking it and only took 2 ml. LC recommended that since the bottle has an hour after it has been Open to offer it every 10 -15 mins in that hours and get the baby use to the artifical nipple. When LC was talking to mom baby rooting , so LC tried feeding the bottle and baby took a few sips and  Gagged. LV feels it will take some time.  LC discussed supply and demand and the importance of establishing milk supply. In order to accomplish that goal pumping both breast at least 8 times a day for 15 -20 mins would be recommended.  LC mentioned to mom it could be similar to how the baby feeds every 2 -3 hours. Save milk and feed back to baby. MBU RN set mom up with a DEBP and mom has pumped x1 without results.  LC reassured mom it takes some time, and being consistent is the key.  Sore nipples and engorgement prevention and tx reviewed. Per mom has a DEBP at home she used with her 1st baby , also has back up plan with WIC if she needs a stronger pump. Mother informed of post-discharge support and given phone number to the lactation department, including services for phone call assistance; out-patient appointments; and breastfeeding support group. List of other breastfeeding resources in the community given in the handout. Encouraged mother to call for problems or concerns related to breastfeeding.   Maternal Data    Feeding Feeding Type: Bottle Fed - Formula  LATCH Score/Interventions                Intervention(s): Breastfeeding basics reviewed (see LC note )     Lactation Tools Discussed/Used Tools:  Pump Breast pump type: Double-Electric Breast Pump WIC Program: Yes Pump Review: Setup, frequency, and cleaning (by MBU RN ) Initiated by:: pierina Date initiated:: 06/08/16   Consult Status Consult Status: Complete Date: 06/08/16    Lauren [name], Lauren [name] 06/08/2016, 12:05 PM

## 2016-06-08 NOTE — Discharge Instructions (Signed)

## 2016-06-10 ENCOUNTER — Ambulatory Visit: Payer: Self-pay

## 2016-06-10 LAB — RH IG WORKUP (INCLUDES ABO/RH)
ABO/RH(D): AB NEG
Fetal Screen: NEGATIVE
Gestational Age(Wks): 40
UNIT DIVISION: 0

## 2016-06-10 NOTE — Lactation Note (Signed)
This note was copied from a baby's chart. Lactation Consultation Note  Patient Name: Lauren [name] ZOXWR'UToday's Date: 06/10/2016 Reason for consult: Follow-up assessment;Other (Comment);Infant weight loss (5% weight loss, triple photo d/c this am and repeat Bili for 4 pm today )  Per mom breast are heavier and feels like milk is in both breast. According to mom and doc flow sheets mom is breast feeding and occasional bottle.  LC encouraged and recommended to mom since her milk if she has a desire to have longevity with breast feeding and milk supply her breast need stimulation at least 8 x's a day Both breast. And to keep it simple offer the breast , always soften 1st breast well to enhance weight gain for baby  Prior to offering the 2nd breast . If the baby only feeds the 1st breast ,  release 2nd breast down with hand expressing or pumping to comfort to protect establishing milk supply also to prevent plugged ducts. If giving a bottle for supplement , breast need to be released  Down for 15 -20 mins , save milk and feed back to baby. Per mom baby is so much more alert today and stools a lot .  Mom denies sore nipples. Sore nipple and engorgement prevention and tx reviewed. Mother informed of post-discharge support and given phone number to the lactation department, including services for phone call assistance; out-patient appointments; and breastfeeding support group. List of other breastfeeding resources in the community given in the handout. Encouraged mother to call for problems or concerns related to breastfeeding.   Maternal Data    Feeding Feeding Type:  (per mom baby last fed at 1145 and at 110p at the breast ) Length of feed: 5 min (per mom )  LATCH Score/Interventions                Intervention(s): Breastfeeding basics reviewed     Lactation Tools Discussed/Used Tools: Pump Breast pump type: Manual (per mom prefers to hand express or use hand pump , not DEBP ) WIC  Program: Yes   Consult Status Consult Status: Complete Date: 06/10/16    Kathrin Greathouseorio, Terrilee Dudzik Ann 06/10/2016, 2:07 PM

## 2016-06-11 LAB — TYPE AND SCREEN
ABO/RH(D): AB NEG
Antibody Screen: POSITIVE
DAT, IgG: NEGATIVE
UNIT DIVISION: 0
Unit division: 0

## 2016-06-22 DIAGNOSIS — Z8619 Personal history of other infectious and parasitic diseases: Secondary | ICD-10-CM

## 2016-06-22 DIAGNOSIS — N12 Tubulo-interstitial nephritis, not specified as acute or chronic: Secondary | ICD-10-CM

## 2016-06-22 HISTORY — DX: Tubulo-interstitial nephritis, not specified as acute or chronic: N12

## 2016-06-22 HISTORY — DX: Personal history of other infectious and parasitic diseases: Z86.19

## 2016-07-08 ENCOUNTER — Encounter (HOSPITAL_COMMUNITY): Payer: Self-pay | Admitting: *Deleted

## 2016-07-08 ENCOUNTER — Emergency Department (HOSPITAL_COMMUNITY): Payer: Medicaid Other

## 2016-07-08 ENCOUNTER — Emergency Department (HOSPITAL_COMMUNITY)
Admission: EM | Admit: 2016-07-08 | Discharge: 2016-07-08 | Disposition: A | Discharge status: Home / Self Care | Payer: Medicaid Other | Attending: Emergency Medicine | Admitting: Emergency Medicine

## 2016-07-08 DIAGNOSIS — R109 Unspecified abdominal pain: Secondary | ICD-10-CM

## 2016-07-08 DIAGNOSIS — O9989 Other specified diseases and conditions complicating pregnancy, childbirth and the puerperium: Secondary | ICD-10-CM | POA: Insufficient documentation

## 2016-07-08 DIAGNOSIS — E876 Hypokalemia: Secondary | ICD-10-CM | POA: Diagnosis not present

## 2016-07-08 DIAGNOSIS — D649 Anemia, unspecified: Secondary | ICD-10-CM

## 2016-07-08 DIAGNOSIS — R1011 Right upper quadrant pain: Secondary | ICD-10-CM | POA: Insufficient documentation

## 2016-07-08 DIAGNOSIS — J45909 Unspecified asthma, uncomplicated: Secondary | ICD-10-CM | POA: Diagnosis not present

## 2016-07-08 LAB — URINE MICROSCOPIC-ADD ON

## 2016-07-08 LAB — URINALYSIS, ROUTINE W REFLEX MICROSCOPIC
Bilirubin Urine: NEGATIVE
GLUCOSE, UA: NEGATIVE mg/dL
KETONES UR: NEGATIVE mg/dL
Nitrite: NEGATIVE
PROTEIN: 30 mg/dL — AB
Specific Gravity, Urine: 1.014 (ref 1.005–1.030)
pH: 5.5 (ref 5.0–8.0)

## 2016-07-08 LAB — CBC
HCT: 35.1 % — ABNORMAL LOW (ref 36.0–46.0)
Hemoglobin: 11.2 g/dL — ABNORMAL LOW (ref 12.0–15.0)
MCH: 26.2 pg (ref 26.0–34.0)
MCHC: 31.9 g/dL (ref 30.0–36.0)
MCV: 82 fL (ref 78.0–100.0)
PLATELETS: 223 10*3/uL (ref 150–400)
RBC: 4.28 MIL/uL (ref 3.87–5.11)
RDW: 14.2 % (ref 11.5–15.5)
WBC: 7.4 10*3/uL (ref 4.0–10.5)

## 2016-07-08 LAB — LIPASE, BLOOD: Lipase: 27 U/L (ref 11–51)

## 2016-07-08 LAB — COMPREHENSIVE METABOLIC PANEL
ALT: 11 U/L — AB (ref 14–54)
AST: 12 U/L — AB (ref 15–41)
Albumin: 3.6 g/dL (ref 3.5–5.0)
Alkaline Phosphatase: 45 U/L (ref 38–126)
Anion gap: 11 (ref 5–15)
BILIRUBIN TOTAL: 0.1 mg/dL — AB (ref 0.3–1.2)
BUN: 13 mg/dL (ref 6–20)
CO2: 23 mmol/L (ref 22–32)
CREATININE: 0.9 mg/dL (ref 0.44–1.00)
Calcium: 8.8 mg/dL — ABNORMAL LOW (ref 8.9–10.3)
Chloride: 106 mmol/L (ref 101–111)
GFR calc Af Amer: >60 mL/min (ref >60)
Glucose, Bld: 131 mg/dL — ABNORMAL HIGH (ref 65–99)
Potassium: 3.2 mmol/L — ABNORMAL LOW (ref 3.5–5.1)
Sodium: 140 mmol/L (ref 135–145)
TOTAL PROTEIN: 6.4 g/dL — AB (ref 6.5–8.1)

## 2016-07-08 MED ORDER — OXYCODONE-ACETAMINOPHEN 5-325 MG PO TABS: 1.0000 | ORAL_TABLET | ORAL | 0 refills | Status: DC | PRN

## 2016-07-08 NOTE — ED Provider Notes (Signed)
[facility] DEPT Provider Note   CSN: [id number] Arrival date & time: 07/08/16  0044     History   Chief Complaint Chief Complaint  Patient presents with  . Flank Pain    HPI Lauren [name] is a 25 y.o. female.  The history is provided by the patient.  Flank Pain   She is one month postpartum and is breast-feeding. 5 days ago, she started having pain in the right flank which radiated around to the right midabdomen. Pain is sharp and all. It is rated at 10/10 at its worst, 6/10 currently. There is partial relief with ibuprofen. Pain is not affected by movement or body position or eating. There is no associated nausea or vomiting. There is no dysuria or urinary urgency or frequency.  Past Medical History:  Diagnosis Date  . Allergy    allergic rhinitis  . Asthma   . Gestational diabetes    with first pregnancy  . Obesity     Patient Active Problem List   Diagnosis Date Noted  . Indication for care or intervention in labor or delivery 06/07/2016  . IUGR (intrauterine growth restriction) 09/16/2013  . Rh negative state in antepartum period 07/31/2013  . BMI 40.0-44.9, adult (HCC) 07/31/2013  . Allergic rhinitis 01/29/2013    Past Surgical History:  Procedure Laterality Date  . NO PAST SURGERIES    . WISDOM TOOTH EXTRACTION      OB History    Gravida Para Term Preterm AB Living   2 2 2     2    SAB TAB Ectopic Multiple Live Births         0 2       Home Medications    Prior to Admission medications   Medication Sig Start Date End Date Taking? Authorizing Provider  acetaminophen (TYLENOL) 325 MG tablet Take 650 mg by mouth every 6 (six) hours as needed for mild pain or headache.    Historical Provider, MD  ibuprofen (ADVIL,MOTRIN) 600 MG tablet Take 1 tablet (600 mg total) by mouth every 6 (six) hours. 06/08/16   Jaymes GraffNaima Dillard, MD    Family History Family History  Problem Relation Age of Onset  . Hypertension Mother   . Diabetes Father   . Cancer  Other   . Asthma Other     Social History Social History  Substance Use Topics  . Smoking status: Never Smoker  . Smokeless tobacco: Never Used  . Alcohol use No     Allergies   Bee venom and Other   Review of Systems Review of Systems  Genitourinary: Positive for flank pain.  All other systems reviewed and are negative.    Physical Exam Updated Vital Signs BP 151/97 (BP Location: Left Arm)   Pulse 84   Temp 98.9 F (37.2 C) (Oral)   Resp 18   Ht 5\' 6"  (1.676 m)   Wt 263 lb 12.8 oz (119.7 kg)   SpO2 100%   BMI 42.58 kg/m   Physical Exam  Nursing note and vitals reviewed.  25 year old female, resting comfortably and in no acute distress. Vital signs are significant for hypertension. Oxygen saturation is 100%, which is normal. Head is normocephalic and atraumatic. PERRLA, EOMI. Oropharynx is clear. Neck is nontender and supple without adenopathy or JVD. Back is nontender and there is no CVA tenderness. Lungs are clear without rales, wheezes, or rhonchi. Chest is nontender. Heart has regular rate and rhythm without murmur. Abdomen is soft, flat, with mild  to moderate right upper quadrant tenderness in the plus/minus Murphy sign. There are no masses or hepatosplenomegaly and peristalsis is normoactive. Extremities have 1+ edema, full range of motion is present. Skin is warm and dry without rash. Neurologic: Mental status is normal, cranial nerves are intact, there are no motor or sensory deficits.  ED Treatments / Results  Labs (all labs ordered are listed, but only abnormal results are displayed) Labs Reviewed  COMPREHENSIVE METABOLIC PANEL - Abnormal; Notable for the following:       Result Value   Potassium 3.2 (*)    Glucose, Bld 131 (*)    Calcium 8.8 (*)    Total Protein 6.4 (*)    AST 12 (*)    ALT 11 (*)    Total Bilirubin 0.1 (*)    All other components within normal limits  CBC - Abnormal; Notable for the following:    Hemoglobin 11.2 (*)     HCT 35.1 (*)    All other components within normal limits  URINALYSIS, ROUTINE W REFLEX MICROSCOPIC (NOT AT [facility]) - Abnormal; Notable for the following:    APPearance CLOUDY (*)    Hgb urine dipstick SMALL (*)    Protein, ur 30 (*)    Leukocytes, UA MODERATE (*)    All other components within normal limits  URINE MICROSCOPIC-ADD ON - Abnormal; Notable for the following:    Squamous Epithelial / LPF 0-5 (*)    Bacteria, UA FEW (*)    All other components within normal limits  LIPASE, BLOOD    Radiology US Abdomen Complete  Result Date: 07/08/2016 CLINICAL DATA:  25 y/o  F; 4 days of right flank pain. EXAM: ABDOMEN ULTRASOUND COMPLETE COMPARISON:  02/05/2013 abdominal ultrasound. FINDINGS: Gallbladder: No gallstones or wall thickening visualized. No sonographic Murphy sign noted by sonographer. Common bile duct: Diameter: 2.6 mm Liver: No focal lesion identified. Mild nonspecific heterogeneity of the liver parenchyma. IVC: No abnormality visualized. Pancreas: Visualized portion unremarkable. Spleen: Borderline splenomegaly with the spleen measuring 13.5 cm. Right Kidney: Length: 11.0. Echogenicity within normal limits. No mass or hydronephrosis visualized. Left Kidney: Length: 11.2. Echogenicity within normal limits. No mass or hydronephrosis visualized. Abdominal aorta: No aneurysm visualized. Common iliac arteries are obscured by bowel gas shadow. Other findings: None. IMPRESSION: 1. Mild nonspecific heterogeneity of the liver parenchyma may represent steatosis. Borderline splenomegaly increased in size from 2014. 2. Otherwise unremarkable abdominal ultrasound. Electronically Signed   By: Mitzi Hansen M.D.   On: 07/08/2016 06:38    Procedures Procedures (including critical care time)  Medications Ordered in ED Medications - No data to display   Initial Impression / Assessment and Plan / ED Course  I have reviewed the triage vital signs and the nursing notes.  Pertinent labs  & imaging results that were available during my care of the patient were reviewed by me and considered in my medical decision making (see chart for details).  Clinical Course   Right flank pain which actually seems to be more right upper quadrant. Initial laboratory evaluation shows mild anemia and mild hypokalemia. Urinalysis shows no evidence of UTI or ureterolithiasis. I am suspicious this may be biliary tract disease although symptoms are not typical. She will be sent for abdominal ultrasound. Old records are reviewed, confirming spontaneous vaginal delivery one month ago. She did have peripartum hypertension.  Repeat blood pressures in the ED have normalized. Abdominal ultrasound shows no evidence of cholelithiasis or hydronephrosis. Cause of pain is unclear. There is a  suggestion that it may be radicular and could conceivably be from herpes. This possibility was explained to the patient. In the meantime, symptomatic treatment will be given. She is advised to use acetaminophen and an NSAID-either naproxen or ibuprofen. Given prescription for oxycodone-acetaminophen to use for pain not relieved with above noted medications. Advised for the need to pump her breasts and discard the milk for 24 hours following any narcotic dose.  Final Clinical Impressions(s) / ED Diagnoses   Final diagnoses:  Right flank pain  Hypokalemia  Normochromic normocytic anemia    New Prescriptions New Prescriptions   OXYCODONE-ACETAMINOPHEN (PERCOCET) 5-325 MG TABLET    Take 1 tablet by mouth every 4 (four) hours as needed for moderate pain.     Dione Booze, MD 07/08/16 [id number]

## 2016-07-08 NOTE — ED Triage Notes (Signed)
The pt just delivered one month ago.  She has had rt flank pain since Tuesday having difficulty having bms last one was  2100

## 2016-07-08 NOTE — Discharge Instructions (Signed)
Take acetaminophen (Tylenol) as needed for pain. In addition to acetaminophen, you can take ibuprofen (Advil, Motrin) or naproxen (Aleve). Reserve oxycodone have acetaminophen for pain not relieved with accommodation of acetaminophen and the anti-inflammatory medication. If you take oxycodone-acetaminophen, you should pump her breasts and discard the milk for 24 hours before resuming breast-feeding. Should you break out in a rash in the area where you're hurting, see your doctor immediately see you can get started on medication for shingles.

## 2016-07-10 ENCOUNTER — Emergency Department (HOSPITAL_COMMUNITY): Payer: Medicaid Other

## 2016-07-10 ENCOUNTER — Inpatient Hospital Stay (HOSPITAL_COMMUNITY)
Admission: EM | Admit: 2016-07-10 | Discharge: 2016-07-12 | DRG: 776 | Disposition: A | Discharge status: Home / Self Care | Payer: Medicaid Other | Attending: Internal Medicine | Admitting: Internal Medicine

## 2016-07-10 ENCOUNTER — Encounter (HOSPITAL_COMMUNITY): Payer: Self-pay | Admitting: *Deleted

## 2016-07-10 DIAGNOSIS — B9620 Unspecified Escherichia coli [E. coli] as the cause of diseases classified elsewhere: Secondary | ICD-10-CM | POA: Diagnosis present

## 2016-07-10 DIAGNOSIS — R6510 Systemic inflammatory response syndrome (SIRS) of non-infectious origin without acute organ dysfunction: Secondary | ICD-10-CM | POA: Diagnosis not present

## 2016-07-10 DIAGNOSIS — D649 Anemia, unspecified: Secondary | ICD-10-CM | POA: Diagnosis not present

## 2016-07-10 DIAGNOSIS — N10 Acute tubulo-interstitial nephritis: Secondary | ICD-10-CM | POA: Diagnosis not present

## 2016-07-10 DIAGNOSIS — N12 Tubulo-interstitial nephritis, not specified as acute or chronic: Secondary | ICD-10-CM

## 2016-07-10 DIAGNOSIS — O9081 Anemia of the puerperium: Secondary | ICD-10-CM | POA: Diagnosis present

## 2016-07-10 DIAGNOSIS — O99215 Obesity complicating the puerperium: Secondary | ICD-10-CM | POA: Diagnosis present

## 2016-07-10 DIAGNOSIS — O99285 Endocrine, nutritional and metabolic diseases complicating the puerperium: Secondary | ICD-10-CM | POA: Diagnosis present

## 2016-07-10 DIAGNOSIS — Z6841 Body Mass Index (BMI) 40.0 and over, adult: Secondary | ICD-10-CM

## 2016-07-10 DIAGNOSIS — E669 Obesity, unspecified: Secondary | ICD-10-CM | POA: Diagnosis present

## 2016-07-10 DIAGNOSIS — Z8249 Family history of ischemic heart disease and other diseases of the circulatory system: Secondary | ICD-10-CM

## 2016-07-10 DIAGNOSIS — O85 Puerperal sepsis: Principal | ICD-10-CM | POA: Diagnosis present

## 2016-07-10 DIAGNOSIS — R739 Hyperglycemia, unspecified: Secondary | ICD-10-CM | POA: Diagnosis present

## 2016-07-10 DIAGNOSIS — R102 Pelvic and perineal pain: Secondary | ICD-10-CM

## 2016-07-10 DIAGNOSIS — Z833 Family history of diabetes mellitus: Secondary | ICD-10-CM

## 2016-07-10 DIAGNOSIS — E876 Hypokalemia: Secondary | ICD-10-CM | POA: Diagnosis present

## 2016-07-10 HISTORY — DX: Tubulo-interstitial nephritis, not specified as acute or chronic: N12

## 2016-07-10 LAB — RETICULOCYTES
RBC.: 3.84 MIL/uL — ABNORMAL LOW (ref 3.87–5.11)
RETIC COUNT ABSOLUTE: 42.2 10*3/uL (ref 19.0–186.0)
Retic Ct Pct: 1.1 % (ref 0.4–3.1)

## 2016-07-10 LAB — CBC WITH DIFFERENTIAL/PLATELET
Basophils Absolute: 0 10*3/uL (ref 0.0–0.1)
Basophils Relative: 0 %
EOS ABS: 0 10*3/uL (ref 0.0–0.7)
EOS PCT: 0 %
HCT: 35.9 % — ABNORMAL LOW (ref 36.0–46.0)
Hemoglobin: 11.7 g/dL — ABNORMAL LOW (ref 12.0–15.0)
LYMPHS ABS: 1.3 10*3/uL (ref 0.7–4.0)
Lymphocytes Relative: 16 %
MCH: 26.2 pg (ref 26.0–34.0)
MCHC: 32.6 g/dL (ref 30.0–36.0)
MCV: 80.3 fL (ref 78.0–100.0)
MONO ABS: 0.7 10*3/uL (ref 0.1–1.0)
MONOS PCT: 8 %
Neutro Abs: 6.2 10*3/uL (ref 1.7–7.7)
Neutrophils Relative %: 76 %
PLATELETS: 194 10*3/uL (ref 150–400)
RBC: 4.47 MIL/uL (ref 3.87–5.11)
RDW: 14.2 % (ref 11.5–15.5)
WBC: 8.2 10*3/uL (ref 4.0–10.5)

## 2016-07-10 LAB — URINE MICROSCOPIC-ADD ON

## 2016-07-10 LAB — URINALYSIS, ROUTINE W REFLEX MICROSCOPIC
BILIRUBIN URINE: NEGATIVE
GLUCOSE, UA: NEGATIVE mg/dL
Ketones, ur: NEGATIVE mg/dL
Nitrite: NEGATIVE
PROTEIN: 30 mg/dL — AB
Specific Gravity, Urine: 1.019 (ref 1.005–1.030)
pH: 6.5 (ref 5.0–8.0)

## 2016-07-10 LAB — PHOSPHORUS: PHOSPHORUS: 3 mg/dL (ref 2.5–4.6)

## 2016-07-10 LAB — GLUCOSE, CAPILLARY: GLUCOSE-CAPILLARY: 73 mg/dL (ref 65–99)

## 2016-07-10 LAB — COMPREHENSIVE METABOLIC PANEL
ALT: 10 U/L — ABNORMAL LOW (ref 14–54)
AST: 10 U/L — AB (ref 15–41)
Albumin: 3.5 g/dL (ref 3.5–5.0)
Alkaline Phosphatase: 45 U/L (ref 38–126)
Anion gap: 10 (ref 5–15)
BUN: 9 mg/dL (ref 6–20)
CHLORIDE: 106 mmol/L (ref 101–111)
CO2: 22 mmol/L (ref 22–32)
Calcium: 8.8 mg/dL — ABNORMAL LOW (ref 8.9–10.3)
Creatinine, Ser: 0.91 mg/dL (ref 0.44–1.00)
Glucose, Bld: 120 mg/dL — ABNORMAL HIGH (ref 65–99)
POTASSIUM: 3.3 mmol/L — AB (ref 3.5–5.1)
SODIUM: 138 mmol/L (ref 135–145)
Total Bilirubin: 0.6 mg/dL (ref 0.3–1.2)
Total Protein: 7.1 g/dL (ref 6.5–8.1)

## 2016-07-10 LAB — I-STAT CG4 LACTIC ACID, ED
LACTIC ACID, VENOUS: 2.13 mmol/L — AB (ref 0.5–1.9)
Lactic Acid, Venous: 1.07 mmol/L (ref 0.5–1.9)
Lactic Acid, Venous: <0.3 mmol/L — ABNORMAL LOW (ref 0.5–1.9)
Lactic Acid, Venous: 0.32 mmol/L — ABNORMAL LOW (ref 0.5–1.9)

## 2016-07-10 LAB — I-STAT BETA HCG BLOOD, ED (MC, WL, AP ONLY)

## 2016-07-10 LAB — WET PREP, GENITAL
Sperm: NONE SEEN
Trich, Wet Prep: NONE SEEN
Yeast Wet Prep HPF POC: NONE SEEN

## 2016-07-10 LAB — IRON AND TIBC
IRON: 8 ug/dL — AB (ref 28–170)
Saturation Ratios: 3 % — ABNORMAL LOW (ref 10.4–31.8)
TIBC: 256 ug/dL (ref 250–450)
UIBC: 248 ug/dL

## 2016-07-10 LAB — FERRITIN: Ferritin: 65 ng/mL (ref 11–307)

## 2016-07-10 LAB — GC/CHLAMYDIA PROBE AMP (~~LOC~~) NOT AT ARMC
CHLAMYDIA, DNA PROBE: NEGATIVE
NEISSERIA GONORRHEA: NEGATIVE

## 2016-07-10 LAB — MAGNESIUM: MAGNESIUM: 1.6 mg/dL — AB (ref 1.7–2.4)

## 2016-07-10 LAB — VITAMIN B12: VITAMIN B 12: 181 pg/mL (ref 180–914)

## 2016-07-10 LAB — FOLATE: FOLATE: 14.4 ng/mL (ref >5.9)

## 2016-07-10 MED ORDER — ACETAMINOPHEN 325 MG PO TABS
650.0000 mg | ORAL_TABLET | Freq: Once | ORAL | Status: AC
  Administered 2016-07-10: 650 mg via ORAL
  Filled 2016-07-10: qty 2

## 2016-07-10 MED ORDER — ONDANSETRON HCL 4 MG/2ML IJ SOLN
INTRAMUSCULAR | Status: AC
  Administered 2016-07-10: 4 mg
  Filled 2016-07-10: qty 2

## 2016-07-10 MED ORDER — POTASSIUM CHLORIDE IN NACL 20-0.9 MEQ/L-% IV SOLN
INTRAVENOUS | Status: DC
  Administered 2016-07-10 – 2016-07-12 (×3): via INTRAVENOUS
  Filled 2016-07-10 (×4): qty 1000

## 2016-07-10 MED ORDER — ONDANSETRON HCL 4 MG/2ML IJ SOLN: 4.0000 mg | Freq: Four times a day (QID) | INTRAMUSCULAR | Status: DC | PRN

## 2016-07-10 MED ORDER — SODIUM CHLORIDE 0.9 % IV SOLN
INTRAVENOUS | Status: DC
  Administered 2016-07-10: 10:00:00 via INTRAVENOUS

## 2016-07-10 MED ORDER — MORPHINE SULFATE (PF) 2 MG/ML IV SOLN: 1.0000 mg | INTRAVENOUS | Status: DC | PRN

## 2016-07-10 MED ORDER — DEXTROSE 5 % IV SOLN
1.0000 g | INTRAVENOUS | Status: DC
  Administered 2016-07-11 – 2016-07-12 (×2): 1 g via INTRAVENOUS
  Filled 2016-07-10 (×2): qty 10

## 2016-07-10 MED ORDER — HYDRALAZINE HCL 20 MG/ML IJ SOLN
10.0000 mg | INTRAMUSCULAR | Status: DC | PRN
  Administered 2016-07-10: 10 mg via INTRAVENOUS
  Filled 2016-07-10: qty 1

## 2016-07-10 MED ORDER — IBUPROFEN 400 MG PO TABS
ORAL_TABLET | ORAL | Status: AC
  Filled 2016-07-10: qty 2

## 2016-07-10 MED ORDER — ACETAMINOPHEN 325 MG PO TABS
650.0000 mg | ORAL_TABLET | ORAL | Status: DC | PRN
  Administered 2016-07-10 – 2016-07-12 (×3): 650 mg via ORAL
  Filled 2016-07-10 (×3): qty 2

## 2016-07-10 MED ORDER — ACETAMINOPHEN 500 MG PO TABS: 1000.0000 mg | ORAL_TABLET | Freq: Once | ORAL | Status: DC

## 2016-07-10 MED ORDER — PROMETHAZINE HCL 25 MG/ML IJ SOLN: 25.0000 mg | Freq: Four times a day (QID) | INTRAMUSCULAR | Status: DC | PRN

## 2016-07-10 MED ORDER — ACETAMINOPHEN 325 MG PO TABS
ORAL_TABLET | ORAL | Status: AC
  Administered 2016-07-10: 650 mg via ORAL
  Filled 2016-07-10: qty 2

## 2016-07-10 MED ORDER — IBUPROFEN 400 MG PO TABS
800.0000 mg | ORAL_TABLET | ORAL | Status: AC
  Administered 2016-07-10: 800 mg via ORAL

## 2016-07-10 MED ORDER — SODIUM CHLORIDE 0.9 % IV BOLUS (SEPSIS)
30.0000 mL/kg | Freq: Once | INTRAVENOUS | Status: AC
  Administered 2016-07-10: 3579 mL via INTRAVENOUS

## 2016-07-10 MED ORDER — ENOXAPARIN SODIUM 40 MG/0.4ML ~~LOC~~ SOLN
40.0000 mg | SUBCUTANEOUS | Status: DC
  Administered 2016-07-10 – 2016-07-11 (×2): 40 mg via SUBCUTANEOUS
  Filled 2016-07-10 (×2): qty 0.4

## 2016-07-10 MED ORDER — ACETAMINOPHEN 325 MG PO TABS
325.0000 mg | ORAL_TABLET | Freq: Once | ORAL | Status: AC
  Administered 2016-07-10: 650 mg via ORAL

## 2016-07-10 MED ORDER — DEXTROSE 5 % IV SOLN
1.0000 g | Freq: Once | INTRAVENOUS | Status: AC
  Administered 2016-07-10: 1 g via INTRAVENOUS
  Filled 2016-07-10: qty 10

## 2016-07-10 MED ORDER — KETOROLAC TROMETHAMINE 15 MG/ML IJ SOLN
15.0000 mg | Freq: Four times a day (QID) | INTRAMUSCULAR | Status: DC
  Administered 2016-07-10 – 2016-07-11 (×3): 15 mg via INTRAVENOUS
  Filled 2016-07-10 (×3): qty 1

## 2016-07-10 NOTE — ED Provider Notes (Signed)
[facility] Provider Note   CSN: [id number] Arrival date & time: 07/10/16  0118  By signing my name below, I, Arianna Nassar, attest that this documentation has been prepared under the direction and in the presence of Gilda Crease, MD.  Electronically Signed: Octavia Heir, ED Scribe. 07/10/16. 3:41 AM.    History   Chief Complaint Chief Complaint  Patient presents with  . Fever   The history is provided by the patient. No language interpreter was used.   HPI Comments: Lauren [name] is a 25 y.o. female who presents to the Emergency Department complaining of intermittent, gradual worsening, fever (tmax 104) onset 2 days ago. Associated right flank pain, nausea, vomiting, and sore throat. Pt was seen on 9/17 for the seen for right flank pain and was treated for possible shingles. Pt gave birth on 08/17 and had a normal vaginal delivery. She has not been having abnormal bleeding or abnormal vaginal discharge. Denies diarrhea, abdominal pain, rhinorrhea, dysuria, hematuria, or cough. Pt is currently breast feeding.  Past Medical History:  Diagnosis Date  . Allergy    allergic rhinitis  . Asthma   . Gestational diabetes    with first pregnancy  . Obesity     Patient Active Problem List   Diagnosis Date Noted  . Indication for care or intervention in labor or delivery 06/07/2016  . IUGR (intrauterine growth restriction) 09/16/2013  . Rh negative state in antepartum period 07/31/2013  . BMI 40.0-44.9, adult (HCC) 07/31/2013  . Allergic rhinitis 01/29/2013    Past Surgical History:  Procedure Laterality Date  . NO PAST SURGERIES    . WISDOM TOOTH EXTRACTION      OB History    Gravida Para Term Preterm AB Living   2 2 2     2    SAB TAB Ectopic Multiple Live Births         0 2       Home Medications    Prior to Admission medications   Medication Sig Start Date End Date Taking? Authorizing Provider  acetaminophen (TYLENOL) 325 MG tablet Take 650  mg by mouth every 6 (six) hours as needed for mild pain or headache.    Historical Provider, MD  ibuprofen (ADVIL,MOTRIN) 600 MG tablet Take 1 tablet (600 mg total) by mouth every 6 (six) hours. 06/08/16   Jaymes Graff, MD  oxyCODONE-acetaminophen (PERCOCET) 5-325 MG tablet Take 1 tablet by mouth every 4 (four) hours as needed for moderate pain. 07/08/16   Dione Booze, MD    Family History Family History  Problem Relation Age of Onset  . Hypertension Mother   . Diabetes Father   . Cancer Other   . Asthma Other     Social History Social History  Substance Use Topics  . Smoking status: Never Smoker  . Smokeless tobacco: Never Used  . Alcohol use No     Allergies   Bee venom and Other   Review of Systems Review of Systems  Constitutional: Positive for fever.  HENT: Positive for sore throat.   Respiratory: Negative for cough.   Gastrointestinal: Positive for nausea and vomiting. Negative for anal bleeding.  Genitourinary: Positive for flank pain. Negative for hematuria, vaginal bleeding and vaginal discharge.  All other systems reviewed and are negative.    Physical Exam Updated Vital Signs BP 127/81   Pulse 97   Temp 99 F (37.2 C) (Oral)   Resp 20   Ht 5' 6.5" (1.689 m)   [employer]  263 lb (119.3 kg)   SpO2 98%   BMI 41.81 kg/m   Physical Exam  Constitutional: She is oriented to person, place, and time. She appears well-developed and well-nourished. No distress.  HENT:  Head: Normocephalic and atraumatic.  Right Ear: Hearing normal.  Left Ear: Hearing normal.  Nose: Nose normal.  Mouth/Throat: Oropharynx is clear and moist and mucous membranes are normal.  Eyes: Conjunctivae and EOM are normal. Pupils are equal, round, and reactive to light.  Neck: Normal range of motion. Neck supple.  Cardiovascular: Regular rhythm, S1 normal and S2 normal.  Tachycardia present.  Exam reveals no gallop and no friction rub.   No murmur heard. Pulmonary/Chest: Effort normal and  breath sounds normal. No respiratory distress. She exhibits no tenderness.  Abdominal: Soft. Normal appearance and bowel sounds are normal. There is no hepatosplenomegaly. There is no tenderness. There is no rebound, no guarding, no tenderness at McBurney's point and negative Murphy's sign. No hernia.  Right CVA tenderness  Musculoskeletal: Normal range of motion.  Neurological: She is alert and oriented to person, place, and time. She has normal strength. No cranial nerve deficit or sensory deficit. Coordination normal. GCS eye subscore is 4. GCS verbal subscore is 5. GCS motor subscore is 6.  Skin: Skin is warm, dry and intact. No rash noted. No cyanosis.  Psychiatric: She has a normal mood and affect. Her speech is normal and behavior is normal. Thought content normal.  Nursing note and vitals reviewed.    ED Treatments / Results  DIAGNOSTIC STUDIES: Oxygen Saturation is 97% on RA, normal by my interpretation.  COORDINATION OF CARE:  3:37 AM Discussed treatment plan with pt at bedside and pt agreed to plan.  Labs (all labs ordered are listed, but only abnormal results are displayed) Labs Reviewed  WET PREP, GENITAL - Abnormal; Notable for the following:       Result Value   Clue Cells Wet Prep HPF POC PRESENT (*)    WBC, Wet Prep HPF POC TOO NUMEROUS TO COUNT (*)    All other components within normal limits  COMPREHENSIVE METABOLIC PANEL - Abnormal; Notable for the following:    Potassium 3.3 (*)    Glucose, Bld 120 (*)    Calcium 8.8 (*)    AST 10 (*)    ALT 10 (*)    All other components within normal limits  CBC WITH DIFFERENTIAL/PLATELET - Abnormal; Notable for the following:    Hemoglobin 11.7 (*)    HCT 35.9 (*)    All other components within normal limits  URINALYSIS, ROUTINE W REFLEX MICROSCOPIC (NOT AT [facility]) - Abnormal; Notable for the following:    APPearance CLOUDY (*)    Hgb urine dipstick SMALL (*)    Protein, ur 30 (*)    Leukocytes, UA MODERATE (*)    All  other components within normal limits  URINE MICROSCOPIC-ADD ON - Abnormal; Notable for the following:    Squamous Epithelial / LPF 0-5 (*)    Bacteria, UA RARE (*)    All other components within normal limits  I-STAT CG4 LACTIC ACID, ED - Abnormal; Notable for the following:    Lactic Acid, Venous 2.13 (*)    All other components within normal limits  I-STAT CG4 LACTIC ACID, ED - Abnormal; Notable for the following:    Lactic Acid, Venous <0.30 (*)    All other components within normal limits  URINE CULTURE  CULTURE, BLOOD (ROUTINE X 2)  CULTURE, BLOOD (ROUTINE X  2)  I-STAT BETA HCG BLOOD, ED ([facility], [facility], [facility] ONLY)  I-STAT CG4 LACTIC ACID, ED  GC/CHLAMYDIA PROBE AMP ([place]) NOT AT [facility]    EKG  EKG Interpretation None       Radiology Dg Chest 2 View  Result Date: 07/10/2016 CLINICAL DATA:  Acute onset of vomiting and fever. Initial encounter. EXAM: CHEST  2 VIEW COMPARISON:  None. FINDINGS: The lungs are well-aerated and clear. There is no evidence of focal opacification, pleural effusion or pneumothorax. The heart is normal in size; the mediastinal contour is within normal limits. No acute osseous abnormalities are seen. IMPRESSION: No acute cardiopulmonary process seen. Electronically Signed   By: Roanna RaiderJeffery  Chang M.D.   On: 07/10/2016 03:23   [place] Abdomen Complete  Result Date: 07/08/2016 CLINICAL DATA:  25 y/o  F; 4 days of right flank pain. EXAM: ABDOMEN ULTRASOUND COMPLETE COMPARISON:  02/05/2013 abdominal ultrasound. FINDINGS: Gallbladder: No gallstones or wall thickening visualized. No sonographic Murphy sign noted by sonographer. Common bile duct: Diameter: 2.6 mm Liver: No focal lesion identified. Mild nonspecific heterogeneity of the liver parenchyma. IVC: No abnormality visualized. Pancreas: Visualized portion unremarkable. Spleen: Borderline splenomegaly with the spleen measuring 13.5 cm. Right Kidney: Length: 11.0. Echogenicity within normal limits. No mass or  hydronephrosis visualized. Left Kidney: Length: 11.2. Echogenicity within normal limits. No mass or hydronephrosis visualized. Abdominal aorta: No aneurysm visualized. Common iliac arteries are obscured by bowel gas shadow. Other findings: None. IMPRESSION: 1. Mild nonspecific heterogeneity of the liver parenchyma may represent steatosis. Borderline splenomegaly increased in size from 2014. 2. Otherwise unremarkable abdominal ultrasound. Electronically Signed   By: Mitzi HansenLance  Furusawa-Stratton M.D.   On: 07/08/2016 06:38    Procedures Procedures (including critical care time)  Medications Ordered in ED Medications  acetaminophen (TYLENOL) tablet 325 mg (650 mg Oral Given 07/10/16 0145)  ibuprofen (ADVIL,MOTRIN) tablet 800 mg (800 mg Oral Given 07/10/16 0254)  sodium chloride 0.9 % bolus 3,579 mL (3,579 mLs Intravenous New Bag/Given 07/10/16 0254)  ondansetron (ZOFRAN) 4 MG/2ML injection (4 mg  Given 07/10/16 0254)  cefTRIAXone (ROCEPHIN) 1 g in dextrose 5 % 50 mL IVPB (0 g Intravenous Stopped 07/10/16 0500)     Initial Impression / Assessment and Plan / ED Course  I have reviewed the triage vital signs and the nursing notes.  Pertinent labs & imaging results that were available during my care of the patient were reviewed by me and considered in my medical decision making (see chart for details).  Clinical Course   Patient presents to the ER for evaluation of flank pain. Patient has been experiencing symptoms for several days but in the last 24 hours or so she has developed a high fever, nausea and vomiting along with the pain. She did have a high fever at arrival to the ER which responded to Tylenol and ibuprofen. Examination revealed mild suprapubic tenderness as well as right CVA tenderness. Urinalysis does show signs of infection. Patient initiated on Rocephin. Because she has one month postpartum, however, fever secondary to endometritis was also considered in the differential. This was discussed  briefly with Dr. Su Hiltoberts, on call for her OB/GYN, Dr. Normand Sloopillard. She did recommend performing ultrasound to evaluate for the possibility of retained products of conception. Case will be signed out to oncomig ER physician to follow up results of US and disposition accordingly.  I personally performed the services described in this documentation, which was scribed in my presence. The recorded information has been reviewed and is accurate.  Final Clinical Impressions(s) / ED Diagnoses   Final diagnoses:  Pelvic pain in female  Pyelonephritis    New Prescriptions New Prescriptions   No medications on file     Gilda Crease, MD 07/10/16 [phone]

## 2016-07-10 NOTE — ED Notes (Signed)
Pelvic cart @ bedside.  

## 2016-07-10 NOTE — ED Triage Notes (Signed)
Pt reports since leaving the ED on Sunday (treated for flank pain and "possible shingles") she has been having fevers and still having the pain. Reports fevers of 104 today, last had ibuprofen around 1830 last night. Reports 2 episodes of vomiting yesterday. Denies any rash. Reports normal vaginal delivery on August 17

## 2016-07-10 NOTE — Progress Notes (Signed)
New Admission Note: transfer from ED  Arrival Method: stretcher Mental Orientation:  A/o x4 Telemetry: none Assessment: Completed Skin: clean dry intact IV:LH SL, RAC NS20K @ 125 Pain:none Tubes:none Safety Measures: Safety Fall Prevention Plan has been given, discussed and signed Admission: Completed Unit Orientation: Patient has been orientated to the room, unit and staff.  Family: husband at bedside  Orders have been reviewed and implemented. Will continue to monitor the patient. Call light has been placed within reach and bed alarm has been activated.   Janeann ForehandLuke Maylynn Orzechowski BSN, RN

## 2016-07-10 NOTE — ED Notes (Signed)
Patient transported to X-ray 

## 2016-07-10 NOTE — ED Provider Notes (Signed)
Ultrasound reassuring for endometritis, but the patient again has fever, tachycardia, will receive additional fluids, Tylenol, be admitted for monitoring, management.   Lauren Munchobert Etheline Geppert, MD 07/10/16 1006

## 2016-07-10 NOTE — ED Notes (Signed)
MD at bedside. 

## 2016-07-10 NOTE — ED Notes (Signed)
Called mini lab to follow up on lactic, states they did not receive/run specimen. Pt taken back for reassessment at this time, [place], [place] notified of current vitals, new order obtained

## 2016-07-10 NOTE — H&P (Signed)
History and Physical    Lauren [name]:[id number] DOB: 11-19-1990 DOA: 07/10/2016   PCP: Leo Grosser, MD   Patient coming from/Resides with: Private residence/lives with husband  Admission status: Observation/medical floor  Chief Complaint: Fever and back pain  HPI: Lauren [name] is a 25 y.o. female with medical history significant for recent vaginal delivery one month ago, breast-feeding, obesity with a BMI greater than 42 presents to ER with ongoing right flank pain since last Tuesday. Symptoms have progressed and patient began having fever on 9/18. She has not had any emesis or diarrhea. She does notice foul-smelling urine. She is now complaining of headache and nausea. She's never had a significant urinary tract infection/similar sx's in the past.  ED Course:  Vital Signs: BP 160/97 (BP Location: Left Arm)   Pulse (!) 122   Temp 102.9 F (39.4 C) (Oral)   Resp 20   Ht 5' 6.5" (1.689 m)   Wt 119.3 kg (263 lb)   SpO2 100%   BMI 41.81 kg/m  [place] complete pelvis with transvaginal views: Ultrasound measurements compatible with recent delivery, normal endometrial stripe, no evidence of retained products of conception Lab data: Sodium 138, potassium 3.3, CO2 22, BUN 9, creatinine 0.91, calcium 8.8, glucose 120, LFTs normal, WBCs 8200 with neutrophils 76% and absolute neutrophils 6.2%, hemoglobin 11.7, platelets 194,000-urinalysis markedly abnormal and concerning for UTI with cloudy appearance, rare bacteria, small hemoglobin, moderate leukocytes, protein 30, too numerous to count WBCs, wet prep with clue cells, blood cultures and urine culture obtained in the ER, show lactic acid normal at 1.7 but increased to 2.13 and has subsequently normalized after fluid hydration and treatment antibiotics Medications and treatments: Tylenol 650 mg 2 doses, Advil 800 mg by mouth 1, normal saline bolus+ 3500 mL, Zofran 4 mg IV 1, Rocephin 1 g IV 1  Review of Systems:  In addition to  the HPI above,  No Headache, changes with Vision or hearing, new weakness, tingling, numbness in any extremity, No problems swallowing food or Liquids, indigestion/reflux No Chest pain, Cough or Shortness of Breath, palpitations, orthopnea or DOE No Abdominal pain, emesis, melena or hematochezia, no dark tarry stools No hematuria  No new skin rashes, lesions, masses or bruises, No new joints pains-aches No recent weight gain or loss No polyuria, polydypsia or polyphagia,   Past Medical History:  Diagnosis Date  . Allergy    allergic rhinitis  . Asthma   . Gestational diabetes    with first pregnancy  . Obesity     Past Surgical History:  Procedure Laterality Date  . NO PAST SURGERIES    . WISDOM TOOTH EXTRACTION      Social History   Social History  . Marital status: Single    Spouse name: N/A  . Number of children: N/A  . Years of education: N/A   Occupational History  . Not on file.   Social History Main Topics  . Smoking status: Never Smoker  . Smokeless tobacco: Never Used  . Alcohol use No  . Drug use: No  . Sexual activity: Yes    Birth control/ protection: None   Other Topics Concern  . Not on file   Social History Narrative  . No narrative on file    Mobility: Without assistive devices Work history: Does not work outside the home   Allergies  Allergen Reactions  . Bee Venom Swelling  . Other Hives and Other (See Comments)    Pt states that she  is allergic to Zaxby's zax sauce.    Family History  Problem Relation Age of Onset  . Hypertension Mother   . Diabetes Father   . Cancer Other   . Asthma Other      Prior to Admission medications   Medication Sig Start Date End Date Taking? Authorizing Provider  acetaminophen (TYLENOL) 325 MG tablet Take 650 mg by mouth every 6 (six) hours as needed for mild pain or headache.    Historical Provider, MD  ibuprofen (ADVIL,MOTRIN) 600 MG tablet Take 1 tablet (600 mg total) by mouth every 6 (six)  hours. 06/08/16   Jaymes Graff, MD  oxyCODONE-acetaminophen (PERCOCET) 5-325 MG tablet Take 1 tablet by mouth every 4 (four) hours as needed for moderate pain. 07/08/16   Dione Booze, MD    Physical Exam: Vitals:   07/10/16 0630 07/10/16 0645 07/10/16 0700 07/10/16 0959  BP: 127/58 118/55 132/91 160/97  Pulse: 62 72 66 (!) 122  Resp: 22 24 22 20   Temp:    102.9 F (39.4 C)  TempSrc:    Oral  SpO2: 99% 98% 99% 100%  Weight:      Height:          Constitutional: NAD, calm, Appears acutely ill but nontoxic and is reporting headache Eyes: PERRL, lids and conjunctivae normal ENMT: Mucous membranes are dry. Posterior pharynx clear of any exudate or lesions.Normal dentition.  Neck: normal, supple, no masses, no thyromegaly Respiratory: clear to auscultation bilaterally, no wheezing, no crackles. Normal respiratory effort. No accessory muscle use.  Cardiovascular: Regular, tachycardic rate and rhythm, no murmurs / rubs / gallops. No extremity edema. 2+ pedal pulses. No carotid bruits.  Abdomen: no tenderness, no masses palpated. No hepatosplenomegaly. Bowel sounds positive. Genitourinary: Positive CVAT on right  Musculoskeletal: no clubbing / cyanosis. No joint deformity upper and lower extremities. Good ROM, no contractures. Normal muscle tone.  Skin: no rashes, lesions, ulcers. No induration Neurologic: CN 2-12 grossly intact. Sensation intact, DTR normal. Strength 5/5 x all 4 extremities.  Psychiatric: Normal judgment and insight. Alert and oriented x 3. Normal mood.    Labs on Admission: I have personally reviewed following labs and imaging studies  CBC:  Recent Labs Lab 07/08/16 0107 07/10/16 0132  WBC 7.4 8.2  NEUTROABS  --  6.2  HGB 11.2* 11.7*  HCT 35.1* 35.9*  MCV 82.0 80.3  PLT 223 194   Basic Metabolic Panel:  Recent Labs Lab 07/08/16 0107 07/10/16 0132  NA 140 138  K 3.2* 3.3*  CL 106 106  CO2 23 22  GLUCOSE 131* 120*  BUN 13 9  CREATININE 0.90 0.91    CALCIUM 8.8* 8.8*   GFR: Estimated Creatinine Clearance: 126.4 mL/min (by C-G formula based on SCr of 0.91 mg/dL). Liver Function Tests:  Recent Labs Lab 07/08/16 0107 07/10/16 0132  AST 12* 10*  ALT 11* 10*  ALKPHOS 45 45  BILITOT 0.1* 0.6  PROT 6.4* 7.1  ALBUMIN 3.6 3.5    Recent Labs Lab 07/08/16 0107  LIPASE 27   No results for input(s): AMMONIA in the last 168 hours. Coagulation Profile: No results for input(s): INR, PROTIME in the last 168 hours. Cardiac Enzymes: No results for input(s): CKTOTAL, CKMB, CKMBINDEX, TROPONINI in the last 168 hours. BNP (last 3 results) No results for input(s): PROBNP in the last 8760 hours. HbA1C: No results for input(s): HGBA1C in the last 72 hours. CBG: No results for input(s): GLUCAP in the last 168 hours. Lipid Profile: No results  for input(s): CHOL, HDL, LDLCALC, TRIG, CHOLHDL, LDLDIRECT in the last 72 hours. Thyroid Function Tests: No results for input(s): TSH, T4TOTAL, FREET4, T3FREE, THYROIDAB in the last 72 hours. Anemia Panel: No results for input(s): VITAMINB12, FOLATE, FERRITIN, TIBC, IRON, RETICCTPCT in the last 72 hours. Urine analysis:    Component Value Date/Time   COLORURINE YELLOW 07/10/2016 0139   APPEARANCEUR CLOUDY (A) 07/10/2016 0139   LABSPEC 1.019 07/10/2016 0139   PHURINE 6.5 07/10/2016 0139   GLUCOSEU NEGATIVE 07/10/2016 0139   HGBUR SMALL (A) 07/10/2016 0139   BILIRUBINUR NEGATIVE 07/10/2016 0139   KETONESUR NEGATIVE 07/10/2016 0139   PROTEINUR 30 (A) 07/10/2016 0139   UROBILINOGEN 0.2 07/09/2014 1545   NITRITE NEGATIVE 07/10/2016 0139   LEUKOCYTESUR MODERATE (A) 07/10/2016 0139   Sepsis Labs: @LABRCNTIP (procalcitonin:4,lacticidven:4) ) Recent Results (from the past 240 hour(s))  Culture, blood (Routine X 2) w Reflex to ID Panel     Status: None (Preliminary result)   Collection Time: 07/10/16  2:40 AM  Result Value Ref Range Status   Specimen Description BLOOD RIGHT ARM  Final   Special  Requests BOTTLES DRAWN AEROBIC AND ANAEROBIC 5ML  Final   Culture NO GROWTH < 12 HOURS  Final   Report Status PENDING  Incomplete  Culture, blood (Routine X 2) w Reflex to ID Panel     Status: None (Preliminary result)   Collection Time: 07/10/16  2:52 AM  Result Value Ref Range Status   Specimen Description BLOOD LEFT HAND  Final   Special Requests BOTTLES DRAWN AEROBIC AND ANAEROBIC 5ML  Final   Culture NO GROWTH < 12 HOURS  Final   Report Status PENDING  Incomplete  Wet prep, genital     Status: Abnormal   Collection Time: 07/10/16  5:19 AM  Result Value Ref Range Status   Yeast Wet Prep HPF POC NONE SEEN NONE SEEN Final   Trich, Wet Prep NONE SEEN NONE SEEN Final   Clue Cells Wet Prep HPF POC PRESENT (A) NONE SEEN Final   WBC, Wet Prep HPF POC TOO NUMEROUS TO COUNT (A) NONE SEEN Final   Sperm NONE SEEN  Final     Radiological Exams on Admission: Dg Chest 2 View  Result Date: 07/10/2016 CLINICAL DATA:  Acute onset of vomiting and fever. Initial encounter. EXAM: CHEST  2 VIEW COMPARISON:  None. FINDINGS: The lungs are well-aerated and clear. There is no evidence of focal opacification, pleural effusion or pneumothorax. The heart is normal in size; the mediastinal contour is within normal limits. No acute osseous abnormalities are seen. IMPRESSION: No acute cardiopulmonary process seen. Electronically Signed   By: Roanna RaiderJeffery  Chang M.D.   On: 07/10/2016 03:23   [place] Transvaginal Non-ob  Result Date: 07/10/2016 CLINICAL DATA:  Postpartum bleeding intermittently 1 month after delivery. EXAM: TRANSABDOMINAL AND TRANSVAGINAL ULTRASOUND OF PELVIS TECHNIQUE: Both transabdominal and transvaginal ultrasound examinations of the pelvis were performed. Transabdominal technique was performed for global imaging of the pelvis including uterus, ovaries, adnexal regions, and pelvic cul-de-sac. It was necessary to proceed with endovaginal exam following the transabdominal exam to visualize the endometrium.  COMPARISON:  10/22/2015 FINDINGS: Uterus Measurements: 12 x 6 x 8 cm. Size compatible with recent delivery. Measured 12 mm intramural echogenic area in the fundus is not convincing for mass. Endometrium Thickness: 4 mm.  No focal abnormality visualized. Right ovary Measurements: 42 x 29 x 33 mm. Normal appearance/no adnexal mass. Left ovary Measurements: 37 x 24 x 28 mm. Normal appearance/no adnexal mass. Other  findings No abnormal free fluid. IMPRESSION: Normal 4 mm endometrial stripe. Negative for retained products of conception. Electronically Signed   By: Marnee Spring M.D.   On: 07/10/2016 08:57   US Pelvis Complete  Result Date: 07/10/2016 CLINICAL DATA:  Postpartum bleeding intermittently 1 month after delivery. EXAM: TRANSABDOMINAL AND TRANSVAGINAL ULTRASOUND OF PELVIS TECHNIQUE: Both transabdominal and transvaginal ultrasound examinations of the pelvis were performed. Transabdominal technique was performed for global imaging of the pelvis including uterus, ovaries, adnexal regions, and pelvic cul-de-sac. It was necessary to proceed with endovaginal exam following the transabdominal exam to visualize the endometrium. COMPARISON:  10/22/2015 FINDINGS: Uterus Measurements: 12 x 6 x 8 cm. Size compatible with recent delivery. Measured 12 mm intramural echogenic area in the fundus is not convincing for mass. Endometrium Thickness: 4 mm.  No focal abnormality visualized. Right ovary Measurements: 42 x 29 x 33 mm. Normal appearance/no adnexal mass. Left ovary Measurements: 37 x 24 x 28 mm. Normal appearance/no adnexal mass. Other findings No abnormal free fluid. IMPRESSION: Normal 4 mm endometrial stripe. Negative for retained products of conception. Electronically Signed   By: Marnee Spring M.D.   On: 07/10/2016 08:57     Assessment/Plan Principal Problem:   Acute pyelonephritis -Patient presents with flank pain for greater than one week and with 24 hours of foul-smelling urine and high fever exam  and evaluation consistent with acute pyelonephritis -Empiric Rocephin IV -Follow-up on blood cultures and urine culture -IV Zofran/Phenergan for nausea -Attempt clear liquids -IV medications for pain until proves can tolerate solid diet  Active Problems:   SIRS (systemic inflammatory response syndrome)  -Source of infection is urinary tract, patient does not have leukocytosis, she has tachycardia in the setting of fever only, she had mildly elevated serum lactate of 2.13 that quickly returned to normal after hydration -As noted patient is tachypneic and tachycardic while febrile but otherwise she is hemodynamically stable and at this point somewhat hypertensive -Treat etiology/infectious causes and focus on symptom management -Continue IV fluid hydration for at least 24 hours    Postpartum state -Normal vaginal delivery one month ago 8/17 -Patient was breast-feeding infant prior to arrival -Breast pump at the bedside and patient is to pump and discard milk for at least the first 24 hours and can reevaluate prior to discharge regarding safety and efficacy of allowing infant to breast-feed based on medications patient will require upon discharge    Acute hypokalemia -Add potassium to maintenance fluid and follow electrolytes    Anemia -Likely related to recent pregnancy and patient was anemic during pregnancy with baseline hemoglobin anywhere between 10.9 and 12.2 -Check anemia panel in the event patient would benefit from iron supplementation    BMI 40.0-44.9, adult -Defer weight reduction strategies to PCP    Acute hyperglycemia -Check hemoglobin A1c      DVT prophylaxis: Lovenox  Code Status: Full  Family Communication: Husband and mother at bedside  Disposition Plan: Anticipate discharge back. Measured normal bowel movements medically stable Consults called: None     ELLIS,ALLISON L. ANP-BC [facility] Pager [phone]   If 7PM-7AM, please contact  night-coverage [email or web address] Password [email or web address]  07/10/2016, 10:58 AM

## 2016-07-10 NOTE — ED Notes (Signed)
Dr.Pollina informed of pt's lactic acid

## 2016-07-10 NOTE — Progress Notes (Addendum)
Noticed patient's blood pressure has gone up to 186/97 with a pulse of 103 respirations 21 and temperature back up to 101.35F. I have decreased IV fluids from 125 mL an hour to 50 mL an hour and have written orders for IV hydralazine prn with parameters. No past medical history of hypertension although according to the obstetrical discharge summary on 8/18 patient did have some elevated blood pressures but did not have pregnancy-induced hypertension or preeclampsia  Junious SilkAllison Eila Runyan, ANP

## 2016-07-10 NOTE — ED Notes (Signed)
Patient being transported to Ultrasound at this time.

## 2016-07-10 NOTE — ED Notes (Signed)
Signed breast pump out from pediatrics and delivered with kit to patient.

## 2016-07-11 ENCOUNTER — Encounter (HOSPITAL_COMMUNITY): Payer: Self-pay | Admitting: General Practice

## 2016-07-11 DIAGNOSIS — B9620 Unspecified Escherichia coli [E. coli] as the cause of diseases classified elsewhere: Secondary | ICD-10-CM | POA: Diagnosis present

## 2016-07-11 DIAGNOSIS — O9081 Anemia of the puerperium: Secondary | ICD-10-CM | POA: Diagnosis present

## 2016-07-11 DIAGNOSIS — E876 Hypokalemia: Secondary | ICD-10-CM | POA: Diagnosis present

## 2016-07-11 DIAGNOSIS — Z6841 Body Mass Index (BMI) 40.0 and over, adult: Secondary | ICD-10-CM | POA: Diagnosis not present

## 2016-07-11 DIAGNOSIS — N10 Acute tubulo-interstitial nephritis: Secondary | ICD-10-CM

## 2016-07-11 DIAGNOSIS — O85 Puerperal sepsis: Secondary | ICD-10-CM | POA: Diagnosis present

## 2016-07-11 DIAGNOSIS — E669 Obesity, unspecified: Secondary | ICD-10-CM | POA: Diagnosis present

## 2016-07-11 DIAGNOSIS — Z833 Family history of diabetes mellitus: Secondary | ICD-10-CM | POA: Diagnosis not present

## 2016-07-11 DIAGNOSIS — O99285 Endocrine, nutritional and metabolic diseases complicating the puerperium: Secondary | ICD-10-CM | POA: Diagnosis present

## 2016-07-11 DIAGNOSIS — Z8249 Family history of ischemic heart disease and other diseases of the circulatory system: Secondary | ICD-10-CM | POA: Diagnosis not present

## 2016-07-11 DIAGNOSIS — R739 Hyperglycemia, unspecified: Secondary | ICD-10-CM | POA: Diagnosis present

## 2016-07-11 DIAGNOSIS — O99215 Obesity complicating the puerperium: Secondary | ICD-10-CM | POA: Diagnosis present

## 2016-07-11 DIAGNOSIS — N12 Tubulo-interstitial nephritis, not specified as acute or chronic: Secondary | ICD-10-CM | POA: Diagnosis present

## 2016-07-11 LAB — BASIC METABOLIC PANEL
ANION GAP: 10 (ref 5–15)
BUN: 7 mg/dL (ref 6–20)
CALCIUM: 8 mg/dL — AB (ref 8.9–10.3)
CO2: 20 mmol/L — AB (ref 22–32)
CREATININE: 0.83 mg/dL (ref 0.44–1.00)
Chloride: 107 mmol/L (ref 101–111)
Glucose, Bld: 121 mg/dL — ABNORMAL HIGH (ref 65–99)
Potassium: 3.3 mmol/L — ABNORMAL LOW (ref 3.5–5.1)
Sodium: 137 mmol/L (ref 135–145)

## 2016-07-11 LAB — CBC
HCT: 31.2 % — ABNORMAL LOW (ref 36.0–46.0)
HEMOGLOBIN: 9.9 g/dL — AB (ref 12.0–15.0)
MCH: 25.7 pg — AB (ref 26.0–34.0)
MCHC: 31.7 g/dL (ref 30.0–36.0)
MCV: 81 fL (ref 78.0–100.0)
PLATELETS: 158 10*3/uL (ref 150–400)
RBC: 3.85 MIL/uL — AB (ref 3.87–5.11)
RDW: 14.4 % (ref 11.5–15.5)
WBC: 8.3 10*3/uL (ref 4.0–10.5)

## 2016-07-11 LAB — HEMOGLOBIN A1C
Hgb A1c MFr Bld: 5.1 % (ref 4.8–5.6)
Mean Plasma Glucose: 100 mg/dL

## 2016-07-11 NOTE — Progress Notes (Signed)
PROGRESS NOTE  Lauren [name]:[id number] DOB: [birth year] DOA: 07/10/2016 PCP: Leo GrosserPICKARD,Lauren TOM, MD   LOS: 0 days   Brief Narrative: 25 y.o. female with pyelonephritis  Assessment & Plan: Principal Problem:   Acute pyelonephritis Active Problems:   BMI 40.0-44.9, adult (HCC)   Postpartum state   SIRS (systemic inflammatory response syndrome) (HCC)   Acute hypokalemia   Acute hyperglycemia   Anemia   Sepsis due to pyelonephritis - still febrile, fever curve improving - continue Ceftriaxone, prelim cultures with E coli, sensitivities pending, she is clinically improved and will transition to po as soon as antibiogram is available   DVT prophylaxis: Lovenox Code Status: Full Family Communication: significant other bedside Disposition Plan: home when ready  Consultants:   None   Procedures:   None   Antimicrobials:  Ceftriaxone 9/19 >>   Subjective: - no chest pain, shortness of breath, no abdominal pain, nausea or vomiting.  - asking about going home  Objective: Vitals:   07/10/16 1725 07/10/16 2129 07/11/16 0616 07/11/16 0843  BP: (!) 186/97 (!) 145/86 129/84 (!) 147/97  Pulse: (!) 103 (!) 108 95 93  Resp: (!) 21 20 18 18   Temp:  100.2 F (37.9 C) 100.2 F (37.9 C) (!) 100.9 F (38.3 C)  TempSrc:  Oral Oral Oral  SpO2: 100% 100% 99% 100%  Weight:      Height:        Intake/Output Summary (Last 24 hours) at 07/11/16 1441 Last data filed at 07/11/16 1200  Gross per 24 hour  Intake          1672.92 ml  Output             1750 ml  Net           -77.08 ml   Filed Weights   07/10/16 0126  Weight: 119.3 kg (263 lb)    Examination: Constitutional: NAD Vitals:   07/10/16 1725 07/10/16 2129 07/11/16 0616 07/11/16 0843  BP: (!) 186/97 (!) 145/86 129/84 (!) 147/97  Pulse: (!) 103 (!) 108 95 93  Resp: (!) 21 20 18 18   Temp:  100.2 F (37.9 C) 100.2 F (37.9 C) (!) 100.9 F (38.3 C)  TempSrc:  Oral Oral Oral  SpO2: 100% 100% 99% 100%    Weight:      Height:       Respiratory: clear to auscultation bilaterally, no wheezing, no crackles. use.  Cardiovascular: Regular rate and rhythm, no murmurs / rubs / gallops.   No CVA tenderness  Data Reviewed: I have personally reviewed following labs and imaging studies  CBC:  Recent Labs Lab 07/08/16 0107 07/10/16 0132 07/11/16 0616  WBC 7.4 8.2 8.3  NEUTROABS  --  6.2  --   HGB 11.2* 11.7* 9.9*  HCT 35.1* 35.9* 31.2*  MCV 82.0 80.3 81.0  PLT 223 194 158   Basic Metabolic Panel:  Recent Labs Lab 07/08/16 0107 07/10/16 0132 07/10/16 1512 07/11/16 0616  NA 140 138  --  137  K 3.2* 3.3*  --  3.3*  CL 106 106  --  107  CO2 23 22  --  20*  GLUCOSE 131* 120*  --  121*  BUN 13 9  --  7  CREATININE 0.90 0.91  --  0.83  CALCIUM 8.8* 8.8*  --  8.0*  MG  --   --  1.6*  --   PHOS  --   --  3.0  --    GFR: Estimated  Creatinine Clearance: 138.6 mL/min (by C-G formula based on SCr of 0.83 mg/dL). Liver Function Tests:  Recent Labs Lab 07/08/16 0107 07/10/16 0132  AST 12* 10*  ALT 11* 10*  ALKPHOS 45 45  BILITOT 0.1* 0.6  PROT 6.4* 7.1  ALBUMIN 3.6 3.5    Recent Labs Lab 07/08/16 0107  LIPASE 27   No results for input(s): AMMONIA in the last 168 hours. Coagulation Profile: No results for input(s): INR, PROTIME in the last 168 hours. Cardiac Enzymes: No results for input(s): CKTOTAL, CKMB, CKMBINDEX, TROPONINI in the last 168 hours. BNP (last 3 results) No results for input(s): PROBNP in the last 8760 hours. HbA1C:  Recent Labs  07/10/16 1512  HGBA1C 5.1   CBG:  Recent Labs Lab 07/10/16 1721  GLUCAP 73   Lipid Profile: No results for input(s): CHOL, HDL, LDLCALC, TRIG, CHOLHDL, LDLDIRECT in the last 72 hours. Thyroid Function Tests: No results for input(s): TSH, T4TOTAL, FREET4, T3FREE, THYROIDAB in the last 72 hours. Anemia Panel:  Recent Labs  07/10/16 1512  VITAMINB12 181  FOLATE 14.4  FERRITIN 65  TIBC 256  IRON 8*   RETICCTPCT 1.1   Urine analysis:    Component Value Date/Time   COLORURINE YELLOW 07/10/2016 0139   APPEARANCEUR CLOUDY (A) 07/10/2016 0139   LABSPEC 1.019 07/10/2016 0139   PHURINE 6.5 07/10/2016 0139   GLUCOSEU NEGATIVE 07/10/2016 0139   HGBUR SMALL (A) 07/10/2016 0139   BILIRUBINUR NEGATIVE 07/10/2016 0139   KETONESUR NEGATIVE 07/10/2016 0139   PROTEINUR 30 (A) 07/10/2016 0139   UROBILINOGEN 0.2 07/09/2014 1545   NITRITE NEGATIVE 07/10/2016 0139   LEUKOCYTESUR MODERATE (A) 07/10/2016 0139   Sepsis Labs: Invalid input(s): PROCALCITONIN, LACTICIDVEN  Recent Results (from the past 240 hour(s))  Urine culture     Status: Abnormal (Preliminary result)   Collection Time: 07/10/16  1:39 AM  Result Value Ref Range Status   Specimen Description URINE, CLEAN CATCH  Final   Special Requests NONE  Final   Culture (A)  Final    >=100,000 COLONIES/mL ESCHERICHIA COLI SUSCEPTIBILITIES TO FOLLOW    Report Status PENDING  Incomplete  Culture, blood (Routine X 2) w Reflex to ID Panel     Status: None (Preliminary result)   Collection Time: 07/10/16  2:40 AM  Result Value Ref Range Status   Specimen Description BLOOD RIGHT ARM  Final   Special Requests BOTTLES DRAWN AEROBIC AND ANAEROBIC  Final   Culture NO GROWTH < 12 HOURS  Final   Report Status PENDING  Incomplete  Culture, blood (Routine X 2) w Reflex to ID Panel     Status: None (Preliminary result)   Collection Time: 07/10/16  2:52 AM  Result Value Ref Range Status   Specimen Description BLOOD LEFT HAND  Final   Special Requests BOTTLES DRAWN AEROBIC AND ANAEROBIC  Final   Culture NO GROWTH < 12 HOURS  Final   Report Status PENDING  Incomplete  Wet prep, genital     Status: Abnormal   Collection Time: 07/10/16  5:19 AM  Result Value Ref Range Status   Yeast Wet Prep HPF POC NONE SEEN NONE SEEN Final   Trich, Wet Prep NONE SEEN NONE SEEN Final   Clue Cells Wet Prep HPF POC PRESENT (A) NONE SEEN Final   WBC, Wet Prep  HPF POC TOO NUMEROUS TO COUNT (A) NONE SEEN Final   Sperm NONE SEEN  Final      Radiology Studies: Dg Chest 2 View  Result Date: 07/10/2016 CLINICAL DATA:  Acute onset of vomiting and fever. Initial encounter. EXAM: CHEST  2 VIEW COMPARISON:  None. FINDINGS: The lungs are well-aerated and clear. There is no evidence of focal opacification, pleural effusion or pneumothorax. The heart is normal in size; the mediastinal contour is within normal limits. No acute osseous abnormalities are seen. IMPRESSION: No acute cardiopulmonary process seen. Electronically Signed   By: Roanna Raider M.D.   On: 07/10/2016 03:23   US Transvaginal Non-ob  Result Date: 07/10/2016 CLINICAL DATA:  Postpartum bleeding intermittently 1 month after delivery. EXAM: TRANSABDOMINAL AND TRANSVAGINAL ULTRASOUND OF PELVIS TECHNIQUE: Both transabdominal and transvaginal ultrasound examinations of the pelvis were performed. Transabdominal technique was performed for global imaging of the pelvis including uterus, ovaries, adnexal regions, and pelvic cul-de-sac. It was necessary to proceed with endovaginal exam following the transabdominal exam to visualize the endometrium. COMPARISON:  10/22/2015 FINDINGS: Uterus Measurements: 12 x 6 x 8 cm. Size compatible with recent delivery. Measured 12 mm intramural echogenic area in the fundus is not convincing for mass. Endometrium Thickness: 4 mm.  No focal abnormality visualized. Right ovary Measurements: 42 x 29 x 33 mm. Normal appearance/no adnexal mass. Left ovary Measurements: 37 x 24 x 28 mm. Normal appearance/no adnexal mass. Other findings No abnormal free fluid. IMPRESSION: Normal 4 mm endometrial stripe. Negative for retained products of conception. Electronically Signed   By: Marnee Spring M.D.   On: 07/10/2016 08:57   US Pelvis Complete  Result Date: 07/10/2016 CLINICAL DATA:  Postpartum bleeding intermittently 1 month after delivery. EXAM: TRANSABDOMINAL AND TRANSVAGINAL  ULTRASOUND OF PELVIS TECHNIQUE: Both transabdominal and transvaginal ultrasound examinations of the pelvis were performed. Transabdominal technique was performed for global imaging of the pelvis including uterus, ovaries, adnexal regions, and pelvic cul-de-sac. It was necessary to proceed with endovaginal exam following the transabdominal exam to visualize the endometrium. COMPARISON:  10/22/2015 FINDINGS: Uterus Measurements: 12 x 6 x 8 cm. Size compatible with recent delivery. Measured 12 mm intramural echogenic area in the fundus is not convincing for mass. Endometrium Thickness: 4 mm.  No focal abnormality visualized. Right ovary Measurements: 42 x 29 x 33 mm. Normal appearance/no adnexal mass. Left ovary Measurements: 37 x 24 x 28 mm. Normal appearance/no adnexal mass. Other findings No abnormal free fluid. IMPRESSION: Normal 4 mm endometrial stripe. Negative for retained products of conception. Electronically Signed   By: Marnee Spring M.D.   On: 07/10/2016 08:57     Scheduled Meds: . cefTRIAXone (ROCEPHIN)  IV  1 g Intravenous Q24H  . enoxaparin (LOVENOX) injection  40 mg Subcutaneous Q24H   Continuous Infusions: . 0.9 % NaCl with KCl 20 mEq / L 50 mL/hr at 07/11/16 0845     Pamella Pert, MD, PhD [facility] Pager [phone]  If 7PM-7AM, please contact night-coverage [email or web address] Password [email or web address] 07/11/2016, 2:41 PM

## 2016-07-12 LAB — URINE CULTURE

## 2016-07-12 MED ORDER — IBUPROFEN 600 MG PO TABS: 600.0000 mg | ORAL_TABLET | Freq: Four times a day (QID) | ORAL | 0 refills | Status: DC | PRN

## 2016-07-12 MED ORDER — AMOXICILLIN-POT CLAVULANATE 875-125 MG PO TABS: 1.0000 | ORAL_TABLET | Freq: Two times a day (BID) | ORAL | 0 refills | Status: DC

## 2016-07-12 NOTE — Discharge Summary (Signed)
Physician Discharge Summary  Lauren [name] ZOX:[id number] DOB: [birth year] DOA: 07/10/2016  PCP: Lauren Grosser, MD  Admit date: 07/10/2016 Discharge date: 07/12/2016  Admitted From: home Disposition:  home  Recommendations for Outpatient Follow-up:  1. Follow up with PCP in 1-2 weeks 2. Augmentin for 12 additional days  Home Health: none  Equipment/Devices: none  Discharge Condition: stable CODE STATUS: Full Diet recommendation: regular  HPI: Lauren [name] is [name] 25 y.o. female with medical history significant for recent vaginal delivery one month ago, breast-feeding, obesity with [name] BMI greater than 42 presents to ER with ongoing right flank pain since last Tuesday. Symptoms have progressed and patient began having fever on 9/18. She has not had any emesis or diarrhea. She does notice foul-smelling urine. She is now complaining of headache and nausea. She's never had [name] significant urinary tract infection/similar sx's in the past.  Hospital Course: Discharge Diagnoses:  Principal Problem:   Acute pyelonephritis Active Problems:   BMI 40.0-44.9, adult (HCC)   Postpartum state   SIRS (systemic inflammatory response syndrome) (HCC)   Acute hypokalemia   Acute hyperglycemia   Anemia   Sepsis due to pyelonephritis - patient was admitted to the hospital with UTI and acute pyelonephritis, placed on IV antibiotics. Urine cultures were sent and resulted E coli which was pan-sensitive. Blood cultures have remained negative. Her fever curve improved, she clinically was feeling back to normal, ambulating in the hallway asking to go home. She was transitioned to po Augmentin which she is to continue for 12 additional days to complete [name] 14 day course.   Discharge Instructions     Medication List    STOP taking these medications   oxyCODONE-acetaminophen 5-325 MG tablet Commonly known as:  PERCOCET     TAKE these medications   acetaminophen 325 MG tablet Commonly known as:   TYLENOL Take 650 mg by mouth every 6 (six) hours as needed for mild pain or headache.   amoxicillin-clavulanate 875-125 MG tablet Commonly known as:  AUGMENTIN Take 1 tablet by mouth 2 (two) times daily.   ibuprofen 600 MG tablet Commonly known as:  ADVIL,MOTRIN Take 1 tablet (600 mg total) by mouth every 6 (six) hours as needed for fever. What changed:  when to take this  reasons to take this      Follow-up Information    Lauren A, MD. Schedule an appointment as soon as possible for [name] visit in 1 week(s).   Specialty:  Obstetrics and Gynecology Why:  You need to be seen early next week Contact information: [address] [place] [address] [phone]          Allergies  Allergen Reactions  . Bee Venom Swelling  . Other Hives and Other (See Comments)    Pt states that she is allergic to Zaxby's zax sauce.    Consultations:    Procedures/Studies:    Dg Chest 2 View  Result Date: 07/10/2016 CLINICAL DATA:  Acute onset of vomiting and fever. Initial encounter. EXAM: CHEST  2 VIEW COMPARISON:  None. FINDINGS: The lungs are well-aerated and clear. There is no evidence of focal opacification, pleural effusion or pneumothorax. The heart is normal in size; the mediastinal contour is within normal limits. No acute osseous abnormalities are seen. IMPRESSION: No acute cardiopulmonary process seen. Electronically Signed   By: Roanna Raider M.D.   On: 07/10/2016 03:23   US Abdomen Complete  Result Date: 07/08/2016 CLINICAL DATA:  25 y/o  F; 4  days of right flank pain. EXAM: ABDOMEN ULTRASOUND COMPLETE COMPARISON:  02/05/2013 abdominal ultrasound. FINDINGS: Gallbladder: No gallstones or wall thickening visualized. No sonographic Murphy sign noted by sonographer. Common bile duct: Diameter: 2.6 mm Liver: No focal lesion identified. Mild nonspecific heterogeneity of the liver parenchyma. IVC: No abnormality visualized. Pancreas: Visualized portion  unremarkable. Spleen: Borderline splenomegaly with the spleen measuring 13.5 cm. Right Kidney: Length: 11.0. Echogenicity within normal limits. No mass or hydronephrosis visualized. Left Kidney: Length: 11.2. Echogenicity within normal limits. No mass or hydronephrosis visualized. Abdominal aorta: No aneurysm visualized. Common iliac arteries are obscured by bowel gas shadow. Other findings: None. IMPRESSION: 1. Mild nonspecific heterogeneity of the liver parenchyma may represent steatosis. Borderline splenomegaly increased in size from 2014. 2. Otherwise unremarkable abdominal ultrasound. Electronically Signed   By: Mitzi Hansen M.D.   On: 07/08/2016 06:38   US Transvaginal Non-ob  Result Date: 07/10/2016 CLINICAL DATA:  Postpartum bleeding intermittently 1 month after delivery. EXAM: TRANSABDOMINAL AND TRANSVAGINAL ULTRASOUND OF PELVIS TECHNIQUE: Both transabdominal and transvaginal ultrasound examinations of the pelvis were performed. Transabdominal technique was performed for global imaging of the pelvis including uterus, ovaries, adnexal regions, and pelvic cul-de-sac. It was necessary to proceed with endovaginal exam following the transabdominal exam to visualize the endometrium. COMPARISON:  10/22/2015 FINDINGS: Uterus Measurements: 12 x 6 x 8 cm. Size compatible with recent delivery. Measured 12 mm intramural echogenic area in the fundus is not convincing for mass. Endometrium Thickness: 4 mm.  No focal abnormality visualized. Right ovary Measurements: 42 x 29 x 33 mm. Normal appearance/no adnexal mass. Left ovary Measurements: 37 x 24 x 28 mm. Normal appearance/no adnexal mass. Other findings No abnormal free fluid. IMPRESSION: Normal 4 mm endometrial stripe. Negative for retained products of conception. Electronically Signed   By: Marnee Spring M.D.   On: 07/10/2016 08:57   US Pelvis Complete  Result Date: 07/10/2016 CLINICAL DATA:  Postpartum bleeding intermittently 1 month after  delivery. EXAM: TRANSABDOMINAL AND TRANSVAGINAL ULTRASOUND OF PELVIS TECHNIQUE: Both transabdominal and transvaginal ultrasound examinations of the pelvis were performed. Transabdominal technique was performed for global imaging of the pelvis including uterus, ovaries, adnexal regions, and pelvic cul-de-sac. It was necessary to proceed with endovaginal exam following the transabdominal exam to visualize the endometrium. COMPARISON:  10/22/2015 FINDINGS: Uterus Measurements: 12 x 6 x 8 cm. Size compatible with recent delivery. Measured 12 mm intramural echogenic area in the fundus is not convincing for mass. Endometrium Thickness: 4 mm.  No focal abnormality visualized. Right ovary Measurements: 42 x 29 x 33 mm. Normal appearance/no adnexal mass. Left ovary Measurements: 37 x 24 x 28 mm. Normal appearance/no adnexal mass. Other findings No abnormal free fluid. IMPRESSION: Normal 4 mm endometrial stripe. Negative for retained products of conception. Electronically Signed   By: Marnee Spring M.D.   On: 07/10/2016 08:57      Subjective: - no chest pain, shortness of breath, no abdominal pain, nausea or vomiting.   Discharge Exam: Vitals:   07/12/16 1053 07/12/16 1127  BP:    Pulse:    Resp:    Temp: 98.3 F (36.8 C) 98.8 F (37.1 C)   Vitals:   07/12/16 0921 07/12/16 0945 07/12/16 1053 07/12/16 1127  BP: 127/80     Pulse: (!) 103     Resp: 18 (!) 30    Temp: (!) 100.6 F (38.1 C)  98.3 F (36.8 C) 98.8 F (37.1 C)  TempSrc: Oral  Oral Oral  SpO2: 98%  Weight:      Height:        General: Pt is alert, awake, not in acute distress Cardiovascular: RRR, S1/S2 +, no rubs, no gallops Respiratory: CTA bilaterally, no wheezing, no rhonchi  Abdominal: Soft, NT, ND, bowel sounds +    The results of significant diagnostics from this hospitalization (including imaging, microbiology, ancillary and laboratory) are listed below for reference.     Microbiology: Recent Results (from the  past 240 hour(s))  Urine culture     Status: Abnormal   Collection Time: 07/10/16  1:39 AM  Result Value Ref Range Status   Specimen Description URINE, CLEAN CATCH  Final   Special Requests NONE  Final   Culture >=100,000 COLONIES/mL ESCHERICHIA COLI ([name])  Final   Report Status 07/12/2016 FINAL  Final   Organism ID, Bacteria ESCHERICHIA COLI ([name])  Final      Susceptibility   Escherichia coli - MIC*    AMPICILLIN 4 SENSITIVE Sensitive     CEFAZOLIN <=4 SENSITIVE Sensitive     CEFTRIAXONE <=1 SENSITIVE Sensitive     CIPROFLOXACIN <=0.25 SENSITIVE Sensitive     GENTAMICIN <=1 SENSITIVE Sensitive     IMIPENEM <=0.25 SENSITIVE Sensitive     NITROFURANTOIN <=16 SENSITIVE Sensitive     TRIMETH/SULFA <=20 SENSITIVE Sensitive     AMPICILLIN/SULBACTAM <=2 SENSITIVE Sensitive     PIP/TAZO <=4 SENSITIVE Sensitive     Extended ESBL NEGATIVE Sensitive     * >=100,000 COLONIES/mL ESCHERICHIA COLI  Culture, blood (Routine X 2) w Reflex to ID Panel     Status: None (Preliminary result)   Collection Time: 07/10/16  2:40 AM  Result Value Ref Range Status   Specimen Description BLOOD RIGHT ARM  Final   Special Requests BOTTLES DRAWN AEROBIC AND ANAEROBIC 5ML  Final   Culture NO GROWTH < 12 HOURS  Final   Report Status PENDING  Incomplete  Culture, blood (Routine X 2) w Reflex to ID Panel     Status: None (Preliminary result)   Collection Time: 07/10/16  2:52 AM  Result Value Ref Range Status   Specimen Description BLOOD LEFT HAND  Final   Special Requests BOTTLES DRAWN AEROBIC AND ANAEROBIC 5ML  Final   Culture NO GROWTH < 12 HOURS  Final   Report Status PENDING  Incomplete  Wet prep, genital     Status: Abnormal   Collection Time: 07/10/16  5:19 AM  Result Value Ref Range Status   Yeast Wet Prep HPF POC NONE SEEN NONE SEEN Final   Trich, Wet Prep NONE SEEN NONE SEEN Final   Clue Cells Wet Prep HPF POC PRESENT ([name]) NONE SEEN Final   WBC, Wet Prep HPF POC TOO NUMEROUS TO COUNT ([name]) NONE SEEN Final     Sperm NONE SEEN  Final     Labs: BNP (last 3 results) No results for input(s): BNP in the last 8760 hours. Basic Metabolic Panel:  Recent Labs Lab 07/08/16 0107 07/10/16 0132 07/10/16 1512 07/11/16 0616  NA 140 138  --  137  K 3.2* 3.3*  --  3.3*  CL 106 106  --  107  CO2 23 22  --  20*  GLUCOSE 131* 120*  --  121*  BUN 13 9  --  7  CREATININE 0.90 0.91  --  0.83  CALCIUM 8.8* 8.8*  --  8.0*  MG  --   --  1.6*  --   PHOS  --   --  3.0  --  Liver Function Tests:  Recent Labs Lab 07/08/16 0107 07/10/16 0132  AST 12* 10*  ALT 11* 10*  ALKPHOS 45 45  BILITOT 0.1* 0.6  PROT 6.4* 7.1  ALBUMIN 3.6 3.5    Recent Labs Lab 07/08/16 0107  LIPASE 27   No results for input(s): AMMONIA in the last 168 hours. CBC:  Recent Labs Lab 07/08/16 0107 07/10/16 0132 07/11/16 0616  WBC 7.4 8.2 8.3  NEUTROABS  --  6.2  --   HGB 11.2* 11.7* 9.9*  HCT 35.1* 35.9* 31.2*  MCV 82.0 80.3 81.0  PLT 223 194 158   Cardiac Enzymes: No results for input(s): CKTOTAL, CKMB, CKMBINDEX, TROPONINI in the last 168 hours. BNP: Invalid input(s): POCBNP CBG:  Recent Labs Lab 07/10/16 1721  GLUCAP 73   D-Dimer No results for input(s): DDIMER in the last 72 hours. Hgb A1c  Recent Labs  07/10/16 1512  HGBA1C 5.1   Lipid Profile No results for input(s): CHOL, HDL, LDLCALC, TRIG, CHOLHDL, LDLDIRECT in the last 72 hours. Thyroid function studies No results for input(s): TSH, T4TOTAL, T3FREE, THYROIDAB in the last 72 hours.  Invalid input(s): FREET3 Anemia work up  Recent Labs  07/10/16 1512  VITAMINB12 181  FOLATE 14.4  FERRITIN 65  TIBC 256  IRON 8*  RETICCTPCT 1.1   Urinalysis    Component Value Date/Time   COLORURINE YELLOW 07/10/2016 0139   APPEARANCEUR CLOUDY ([name]) 07/10/2016 0139   LABSPEC 1.019 07/10/2016 0139   PHURINE 6.5 07/10/2016 0139   GLUCOSEU NEGATIVE 07/10/2016 0139   HGBUR SMALL ([name]) 07/10/2016 0139   BILIRUBINUR NEGATIVE 07/10/2016 0139    KETONESUR NEGATIVE 07/10/2016 0139   PROTEINUR 30 ([name]) 07/10/2016 0139   UROBILINOGEN 0.2 07/09/2014 1545   NITRITE NEGATIVE 07/10/2016 0139   LEUKOCYTESUR MODERATE ([name]) 07/10/2016 0139   Sepsis Labs Invalid input(s): PROCALCITONIN,  WBC,  LACTICIDVEN Microbiology Recent Results (from the past 240 hour(s))  Urine culture     Status: Abnormal   Collection Time: 07/10/16  1:39 AM  Result Value Ref Range Status   Specimen Description URINE, CLEAN CATCH  Final   Special Requests NONE  Final   Culture >=100,000 COLONIES/mL ESCHERICHIA COLI ([name])  Final   Report Status 07/12/2016 FINAL  Final   Organism ID, Bacteria ESCHERICHIA COLI ([name])  Final      Susceptibility   Escherichia coli - MIC*    AMPICILLIN 4 SENSITIVE Sensitive     CEFAZOLIN <=4 SENSITIVE Sensitive     CEFTRIAXONE <=1 SENSITIVE Sensitive     CIPROFLOXACIN <=0.25 SENSITIVE Sensitive     GENTAMICIN <=1 SENSITIVE Sensitive     IMIPENEM <=0.25 SENSITIVE Sensitive     NITROFURANTOIN <=16 SENSITIVE Sensitive     TRIMETH/SULFA <=20 SENSITIVE Sensitive     AMPICILLIN/SULBACTAM <=2 SENSITIVE Sensitive     PIP/TAZO <=4 SENSITIVE Sensitive     Extended ESBL NEGATIVE Sensitive     * >=100,000 COLONIES/mL ESCHERICHIA COLI  Culture, blood (Routine X 2) w Reflex to ID Panel     Status: None (Preliminary result)   Collection Time: 07/10/16  2:40 AM  Result Value Ref Range Status   Specimen Description BLOOD RIGHT ARM  Final   Special Requests BOTTLES DRAWN AEROBIC AND ANAEROBIC  Final   Culture NO GROWTH < 12 HOURS  Final   Report Status PENDING  Incomplete  Culture, blood (Routine X 2) w Reflex to ID Panel     Status: None (Preliminary result)   Collection Time: 07/10/16  2:52 AM  Result Value Ref Range Status   Specimen Description BLOOD LEFT HAND  Final   Special Requests BOTTLES DRAWN AEROBIC AND ANAEROBIC  Final   Culture NO GROWTH < 12 HOURS  Final   Report Status PENDING  Incomplete  Wet prep, genital     Status:  Abnormal   Collection Time: 07/10/16  5:19 AM  Result Value Ref Range Status   Yeast Wet Prep HPF POC NONE SEEN NONE SEEN Final   Trich, Wet Prep NONE SEEN NONE SEEN Final   Clue Cells Wet Prep HPF POC PRESENT ([name]) NONE SEEN Final   WBC, Wet Prep HPF POC TOO NUMEROUS TO COUNT ([name]) NONE SEEN Final   Sperm NONE SEEN  Final     Time coordinating discharge: Over 30 minutes  SIGNED:  Pamella Pert, MD  [facility] 07/12/2016, 1:36 PM Pager [phone]  If 7PM-7AM, please contact night-coverage [email or web address] Password [email or web address]

## 2016-07-15 LAB — CULTURE, BLOOD (ROUTINE X 2)
CULTURE: NO GROWTH
Culture: NO GROWTH

## 2016-12-16 ENCOUNTER — Emergency Department (HOSPITAL_COMMUNITY)
Admission: EM | Admit: 2016-12-16 | Discharge: 2016-12-16 | Disposition: A | Discharge status: Home / Self Care | Payer: Medicaid Other | Attending: Emergency Medicine | Admitting: Emergency Medicine

## 2016-12-16 ENCOUNTER — Encounter (HOSPITAL_COMMUNITY): Payer: Self-pay | Admitting: Emergency Medicine

## 2016-12-16 DIAGNOSIS — J45909 Unspecified asthma, uncomplicated: Secondary | ICD-10-CM | POA: Insufficient documentation

## 2016-12-16 DIAGNOSIS — R197 Diarrhea, unspecified: Secondary | ICD-10-CM

## 2016-12-16 DIAGNOSIS — E860 Dehydration: Secondary | ICD-10-CM | POA: Insufficient documentation

## 2016-12-16 LAB — CBC WITH DIFFERENTIAL/PLATELET
BASOS PCT: 0 %
Basophils Absolute: 0 10*3/uL (ref 0.0–0.1)
EOS ABS: 0 10*3/uL (ref 0.0–0.7)
Eosinophils Relative: 1 %
HEMATOCRIT: 39 % (ref 36.0–46.0)
Hemoglobin: 12.7 g/dL (ref 12.0–15.0)
Lymphocytes Relative: 35 %
Lymphs Abs: 1.4 10*3/uL (ref 0.7–4.0)
MCH: 27 pg (ref 26.0–34.0)
MCHC: 32.6 g/dL (ref 30.0–36.0)
MCV: 83 fL (ref 78.0–100.0)
MONO ABS: 0.5 10*3/uL (ref 0.1–1.0)
Monocytes Relative: 12 %
NEUTROS ABS: 2.1 10*3/uL (ref 1.7–7.7)
Neutrophils Relative %: 52 %
Platelets: 196 10*3/uL (ref 150–400)
RBC: 4.7 MIL/uL (ref 3.87–5.11)
RDW: 13.7 % (ref 11.5–15.5)
WBC: 4 10*3/uL (ref 4.0–10.5)

## 2016-12-16 LAB — URINALYSIS, ROUTINE W REFLEX MICROSCOPIC
BILIRUBIN URINE: NEGATIVE
GLUCOSE, UA: NEGATIVE mg/dL
HGB URINE DIPSTICK: NEGATIVE
Ketones, ur: 20 mg/dL — AB
Leukocytes, UA: NEGATIVE
Nitrite: NEGATIVE
PH: 5 (ref 5.0–8.0)
Protein, ur: NEGATIVE mg/dL
SPECIFIC GRAVITY, URINE: 1.027 (ref 1.005–1.030)

## 2016-12-16 LAB — COMPREHENSIVE METABOLIC PANEL
ALBUMIN: 4.1 g/dL (ref 3.5–5.0)
ALK PHOS: 35 U/L — AB (ref 38–126)
ALT: 14 U/L (ref 14–54)
AST: 12 U/L — ABNORMAL LOW (ref 15–41)
Anion gap: 9 (ref 5–15)
BUN: 13 mg/dL (ref 6–20)
CALCIUM: 9.4 mg/dL (ref 8.9–10.3)
CO2: 22 mmol/L (ref 22–32)
CREATININE: 0.65 mg/dL (ref 0.44–1.00)
Chloride: 104 mmol/L (ref 101–111)
GFR calc Af Amer: >60 mL/min (ref >60)
GFR calc non Af Amer: >60 mL/min (ref >60)
GLUCOSE: 114 mg/dL — AB (ref 65–99)
Potassium: 3.8 mmol/L (ref 3.5–5.1)
SODIUM: 135 mmol/L (ref 135–145)
Total Bilirubin: 0.5 mg/dL (ref 0.3–1.2)
Total Protein: 7.6 g/dL (ref 6.5–8.1)

## 2016-12-16 LAB — PREGNANCY, URINE: Preg Test, Ur: NEGATIVE

## 2016-12-16 MED ORDER — METRONIDAZOLE 500 MG PO TABS: 500.0000 mg | ORAL_TABLET | Freq: Two times a day (BID) | ORAL | 0 refills | Status: DC

## 2016-12-16 MED ORDER — METRONIDAZOLE 500 MG PO TABS
500.0000 mg | ORAL_TABLET | Freq: Once | ORAL | Status: AC
Start: 2016-12-16 — End: 2016-12-16
  Administered 2016-12-16: 500 mg via ORAL
  Filled 2016-12-16: qty 1

## 2016-12-16 MED ORDER — SODIUM CHLORIDE 0.9 % IV BOLUS (SEPSIS)
1000.0000 mL | Freq: Once | INTRAVENOUS | Status: AC
Start: 2016-12-16 — End: 2016-12-16
  Administered 2016-12-16: 1000 mL via INTRAVENOUS

## 2016-12-16 MED ORDER — LOPERAMIDE HCL 2 MG PO CAPS
4.0000 mg | ORAL_CAPSULE | Freq: Once | ORAL | Status: AC
Start: 2016-12-16 — End: 2016-12-16
  Administered 2016-12-16: 4 mg via ORAL
  Filled 2016-12-16: qty 2

## 2016-12-16 NOTE — ED Triage Notes (Signed)
Pt. Stated, I started having diarrhea since yesterday morning  And its not stopped.

## 2016-12-16 NOTE — ED Provider Notes (Signed)
[facility]-EMERGENCY DEPT Provider Note   CSN: [id number] Arrival date & time: 12/16/16  1002     History   Chief Complaint Chief Complaint  Patient presents with  . Diarrhea    HPI Lauren [name] is a 26 y.o. female.  Pt presents to the ED today with diarrhea since yesterday morning.  She tried kaopectate this morning without improvement in sx.  Pt has had some crampy abdominal pain.  The pt said whenever she eats or drinks, she has diarrhea.  She has no n/v.  The pt denies recent travels/abx.      Past Medical History:  Diagnosis Date  . Allergy    allergic rhinitis  . Asthma   . Gestational diabetes    with first pregnancy  . Obesity   . Pyelonephritis 06/2016    Patient Active Problem List   Diagnosis Date Noted  . Postpartum state 07/10/2016  . Acute pyelonephritis 07/10/2016  . SIRS (systemic inflammatory response syndrome) (HCC) 07/10/2016  . Acute hypokalemia 07/10/2016  . Acute hyperglycemia 07/10/2016  . Anemia 07/10/2016  . Indication for care or intervention in labor or delivery 06/07/2016  . IUGR (intrauterine growth restriction) 09/16/2013  . Rh negative state in antepartum period 07/31/2013  . BMI 40.0-44.9, adult (HCC) 07/31/2013  . Allergic rhinitis 01/29/2013    Past Surgical History:  Procedure Laterality Date  . NO PAST SURGERIES    . WISDOM TOOTH EXTRACTION      OB History    Gravida Para Term Preterm AB Living   2 2 2     2    SAB TAB Ectopic Multiple Live Births         0 2       Home Medications    Prior to Admission medications   Medication Sig Start Date End Date Taking? Authorizing Provider  ibuprofen (ADVIL,MOTRIN) 600 MG tablet Take 1 tablet (600 mg total) by mouth every 6 (six) hours as needed for fever. 07/12/16  Yes Costin Otelia SergeantM Gherghe, MD  acetaminophen (TYLENOL) 325 MG tablet Take 650 mg by mouth every 6 (six) hours as needed for mild pain or headache.    Historical Provider, MD  amoxicillin-clavulanate (AUGMENTIN)  875-125 MG tablet Take 1 tablet by mouth 2 (two) times daily. Patient not taking: Reported on 12/16/2016 07/12/16   Leatha Gildingostin M Gherghe, MD  metroNIDAZOLE (FLAGYL) 500 MG tablet Take 1 tablet (500 mg total) by mouth 2 (two) times daily. 12/16/16   Jacalyn LefevreJulie Nolyn Swab, MD    Family History Family History  Problem Relation Age of Onset  . Hypertension Mother   . Diabetes Father   . Cancer Other   . Asthma Other     Social History Social History  Substance Use Topics  . Smoking status: Never Smoker  . Smokeless tobacco: Never Used  . Alcohol use No     Allergies   Bee venom and Other   Review of Systems Review of Systems  Gastrointestinal: Positive for abdominal pain and diarrhea.  All other systems reviewed and are negative.    Physical Exam Updated Vital Signs BP 128/90 (BP Location: Left Arm)   Pulse 107   Temp 97.9 F (36.6 C) (Oral)   Resp 16   Ht 5' 6.5" (1.689 m)   Wt 264 lb 4 oz (119.9 kg)   LMP 11/06/2016   SpO2 100%   BMI 42.01 kg/m   Physical Exam  Constitutional: She is oriented to person, place, and time. She appears well-developed and well-nourished.  HENT:  Head: Normocephalic and atraumatic.  Right Ear: External ear normal.  Left Ear: External ear normal.  Nose: Nose normal.  Mouth/Throat: Mucous membranes are dry.  Eyes: Conjunctivae and EOM are normal. Pupils are equal, round, and reactive to light.  Neck: Normal range of motion. Neck supple.  Cardiovascular: Regular rhythm, normal heart sounds and intact distal pulses.  Tachycardia present.   Pulmonary/Chest: Effort normal and breath sounds normal.  Abdominal: Soft. Bowel sounds are normal.  Musculoskeletal: Normal range of motion.  Neurological: She is alert and oriented to person, place, and time.  Skin: Skin is warm.  Psychiatric: She has a normal mood and affect. Her behavior is normal. Judgment and thought content normal.  Nursing note and vitals reviewed.    ED Treatments / Results    Labs (all labs ordered are listed, but only abnormal results are displayed) Labs Reviewed  COMPREHENSIVE METABOLIC PANEL - Abnormal; Notable for the following:       Result Value   Glucose, Bld 114 (*)    AST 12 (*)    Alkaline Phosphatase 35 (*)    All other components within normal limits  URINALYSIS, ROUTINE W REFLEX MICROSCOPIC - Abnormal; Notable for the following:    APPearance HAZY (*)    Ketones, ur 20 (*)    All other components within normal limits  CBC WITH DIFFERENTIAL/PLATELET  PREGNANCY, URINE    EKG  EKG Interpretation None       Radiology No results found.  Procedures Procedures (including critical care time)  Medications Ordered in ED Medications  metroNIDAZOLE (FLAGYL) tablet 500 mg (not administered)  sodium chloride 0.9 % bolus 1,000 mL (0 mLs Intravenous Stopped 12/16/16 1336)  loperamide (IMODIUM) capsule 4 mg (4 mg Oral Given 12/16/16 1139)     Initial Impression / Assessment and Plan / ED Course  I have reviewed the triage vital signs and the nursing notes.  Pertinent labs & imaging results that were available during my care of the patient were reviewed by me and considered in my medical decision making (see chart for details).  Pt is feeling better after IVFs.  She will be started of flagyl and knows to return if worse.  F/u with pcp.  Final Clinical Impressions(s) / ED Diagnoses   Final diagnoses:  Diarrhea, unspecified type  Dehydration    New Prescriptions New Prescriptions   METRONIDAZOLE (FLAGYL) 500 MG TABLET    Take 1 tablet (500 mg total) by mouth 2 (two) times daily.     Jacalyn Lefevre, MD 12/16/16 [phone]

## 2017-07-26 ENCOUNTER — Emergency Department (HOSPITAL_COMMUNITY)
Admission: EM | Admit: 2017-07-26 | Discharge: 2017-07-26 | Disposition: A | Discharge status: Home / Self Care | Payer: Self-pay | Attending: Emergency Medicine | Admitting: Emergency Medicine

## 2017-07-26 ENCOUNTER — Encounter (HOSPITAL_COMMUNITY): Payer: Self-pay | Admitting: Emergency Medicine

## 2017-07-26 DIAGNOSIS — Z91040 Latex allergy status: Secondary | ICD-10-CM | POA: Insufficient documentation

## 2017-07-26 DIAGNOSIS — R51 Headache: Secondary | ICD-10-CM | POA: Insufficient documentation

## 2017-07-26 DIAGNOSIS — Z91030 Bee allergy status: Secondary | ICD-10-CM | POA: Insufficient documentation

## 2017-07-26 DIAGNOSIS — Z79899 Other long term (current) drug therapy: Secondary | ICD-10-CM | POA: Insufficient documentation

## 2017-07-26 DIAGNOSIS — R519 Headache, unspecified: Secondary | ICD-10-CM

## 2017-07-26 DIAGNOSIS — J45909 Unspecified asthma, uncomplicated: Secondary | ICD-10-CM | POA: Insufficient documentation

## 2017-07-26 DIAGNOSIS — R1110 Vomiting, unspecified: Secondary | ICD-10-CM | POA: Insufficient documentation

## 2017-07-26 LAB — COMPREHENSIVE METABOLIC PANEL
ALT: 12 U/L — AB (ref 14–54)
AST: 9 U/L — AB (ref 15–41)
Albumin: 4 g/dL (ref 3.5–5.0)
Alkaline Phosphatase: 42 U/L (ref 38–126)
Anion gap: 8 (ref 5–15)
BUN: 12 mg/dL (ref 6–20)
CHLORIDE: 105 mmol/L (ref 101–111)
CO2: 25 mmol/L (ref 22–32)
CREATININE: 0.63 mg/dL (ref 0.44–1.00)
Calcium: 9.3 mg/dL (ref 8.9–10.3)
Glucose, Bld: 110 mg/dL — ABNORMAL HIGH (ref 65–99)
POTASSIUM: 3.5 mmol/L (ref 3.5–5.1)
Sodium: 138 mmol/L (ref 135–145)
Total Bilirubin: 0.5 mg/dL (ref 0.3–1.2)
Total Protein: 7.5 g/dL (ref 6.5–8.1)

## 2017-07-26 LAB — CBC
HEMATOCRIT: 34.2 % — AB (ref 36.0–46.0)
Hemoglobin: 11.1 g/dL — ABNORMAL LOW (ref 12.0–15.0)
MCH: 27.3 pg (ref 26.0–34.0)
MCHC: 32.5 g/dL (ref 30.0–36.0)
MCV: 84.2 fL (ref 78.0–100.0)
PLATELETS: 243 10*3/uL (ref 150–400)
RBC: 4.06 MIL/uL (ref 3.87–5.11)
RDW: 13.8 % (ref 11.5–15.5)
WBC: 7.1 10*3/uL (ref 4.0–10.5)

## 2017-07-26 LAB — LIPASE, BLOOD: Lipase: 25 U/L (ref 11–51)

## 2017-07-26 MED ORDER — SODIUM CHLORIDE 0.9 % IV BOLUS (SEPSIS)
500.0000 mL | Freq: Once | INTRAVENOUS | Status: AC
Start: 2017-07-26 — End: 2017-07-26
  Administered 2017-07-26: 500 mL via INTRAVENOUS

## 2017-07-26 MED ORDER — DIPHENHYDRAMINE HCL 50 MG/ML IJ SOLN
12.5000 mg | Freq: Once | INTRAMUSCULAR | Status: AC
  Administered 2017-07-26: 12.5 mg via INTRAVENOUS
  Filled 2017-07-26: qty 1

## 2017-07-26 MED ORDER — KETOROLAC TROMETHAMINE 15 MG/ML IJ SOLN
15.0000 mg | Freq: Once | INTRAMUSCULAR | Status: AC
  Administered 2017-07-26: 15 mg via INTRAVENOUS
  Filled 2017-07-26: qty 1

## 2017-07-26 MED ORDER — DEXAMETHASONE SODIUM PHOSPHATE 10 MG/ML IJ SOLN
10.0000 mg | Freq: Once | INTRAMUSCULAR | Status: AC
  Administered 2017-07-26: 10 mg via INTRAVENOUS
  Filled 2017-07-26: qty 1

## 2017-07-26 NOTE — ED Provider Notes (Signed)
[facility]-EMERGENCY DEPT Provider Note   CSN: [id number] Arrival date & time: 07/26/17  1336     History   Chief Complaint Chief Complaint  Patient presents with  . Headache  . Emesis    HPI Lauren [name] is a 26 y.o. female presenting with headaches.  Patient states that for the past 10 days, she's had persistent headache. Her headache is bilateral and described as a pounding. It is not present when she first wakes up, but becomes more persistent throughout the day. It resolves with Tylenol or ibuprofen, but returns as soon as the medicine wears off. She reports associated photophobia, nausea, and occasional dizziness. Dizziness is worse when she goes from sitting to standing. She threw up twice yesterday, but has not had any emesis outside of this. She denies history of frequent headaches or migraines. The only time she had persistent headaches like this was when she was pregnant and hypertensive. She has no other medical problems, does not take medications daily. She denies associated vision changes, photophobia, slurred speech, numbness, tingling. She denies fall, trauma, or injury to the head. She denies neck or back pain. She is not on blood thinners.  She reports decreased appetite, eating a small breakfast and small dinner and skipping lunch. This is abnormal for her. She reports she drinks multiple small paper cup full of water throughout the day, and her urine is occasionally clear and occasionally very yellow. She denies hematuria, dysuria, or increased frequency. She denies fevers, chills, rash, chest pain, shortness of breath, abdominal pain, urinary symptoms, abnormal bowel movements. Last period was one year ago, she is on nexplanon.   HPI  Past Medical History:  Diagnosis Date  . Allergy    allergic rhinitis  . Asthma   . Gestational diabetes    with first pregnancy  . Obesity   . Pyelonephritis 06/2016    Patient Active Problem List   Diagnosis Date Noted  .  Postpartum state 07/10/2016  . Acute pyelonephritis 07/10/2016  . SIRS (systemic inflammatory response syndrome) (HCC) 07/10/2016  . Acute hypokalemia 07/10/2016  . Acute hyperglycemia 07/10/2016  . Anemia 07/10/2016  . Indication for care or intervention in labor or delivery 06/07/2016  . IUGR (intrauterine growth restriction) 09/16/2013  . Rh negative state in antepartum period 07/31/2013  . BMI 40.0-44.9, adult (HCC) 07/31/2013  . Allergic rhinitis 01/29/2013    Past Surgical History:  Procedure Laterality Date  . NO PAST SURGERIES    . WISDOM TOOTH EXTRACTION      OB History    Gravida Para Term Preterm AB Living   SAB TAB Ectopic Multiple Live Births         0 2       Home Medications    Prior to Admission medications   Medication Sig Start Date End Date Taking? Authorizing Provider  acetaminophen (TYLENOL) 325 MG tablet Take 650 mg by mouth every 6 (six) hours as needed for mild pain or headache.   Yes [provider]  etonogestrel (NEXPLANON) 68 MG IMPL implant 1 each by Subdermal route once.   Yes [provider]  Ibuprofen (ADVIL) 200 MG CAPS Take 400 capsules by mouth daily as needed (headache/pain).   Yes [provider]  amoxicillin-clavulanate (AUGMENTIN) 875-125 MG tablet Take 1 tablet by mouth 2 (two) times daily. Patient not taking: Reported on 12/16/2016 07/12/16   Leatha Gilding, MD  ibuprofen (ADVIL,MOTRIN) 600 MG tablet  Take 1 tablet (600 mg total) by mouth every 6 (six) hours as needed for fever. Patient not taking: Reported on 07/26/2017 07/12/16   Leatha Gilding, MD  metroNIDAZOLE (FLAGYL) 500 MG tablet Take 1 tablet (500 mg total) by mouth 2 (two) times daily. Patient not taking: Reported on 07/26/2017 12/16/16   Jacalyn Lefevre, MD    Family History Family History  Problem Relation Age of Onset  . Hypertension Mother   . Diabetes Father   . Cancer Other   . Asthma Other     Social History Social  History  Substance Use Topics  . Smoking status: Never Smoker  . Smokeless tobacco: Never Used  . Alcohol use No     Allergies   Bee venom; Latex; and Other   Review of Systems Review of Systems  Constitutional: Positive for appetite change (decreased appetite). Negative for chills and fever.  HENT: Negative for congestion and sore throat.   Eyes: Negative for photophobia and visual disturbance.  Respiratory: Negative for cough, chest tightness and shortness of breath.   Cardiovascular: Negative for chest pain, palpitations and leg swelling.  Gastrointestinal: Positive for vomiting (2 yesterday.). Negative for abdominal pain, constipation and diarrhea.  Genitourinary: Negative for dysuria, frequency and hematuria.  Musculoskeletal: Negative for back pain and neck pain.  Skin: Negative for wound.  Neurological: Positive for dizziness (not currently) and headaches.  Hematological: Does not bruise/bleed easily.  Psychiatric/Behavioral: Negative for confusion and decreased concentration.     Physical Exam Updated Vital Signs BP 128/64 (BP Location: Right Arm)   Pulse 84   Temp 98.9 F (37.2 C) (Oral)   Resp 18   Ht  (1.676 m)   Wt 114.3 kg (252 lb)   SpO2 100%   BMI 40.67 kg/m   Physical Exam  Constitutional: She is oriented to person, place, and time. She appears well-developed and well-nourished. No distress.  HENT:  Head: Normocephalic and atraumatic.  Right Ear: Tympanic membrane, external ear and ear canal normal.  Left Ear: Tympanic membrane, external ear and ear canal normal.  Mouth/Throat: Uvula is midline, oropharynx is clear and moist and mucous membranes are normal.  No tenderness to palpation of the scalp.  Eyes: Pupils are equal, round, and reactive to light. Conjunctivae and EOM are normal.  No nystagmus  Neck: Normal range of motion. Neck supple.  No tenderness to palpation midline cervical spine  Cardiovascular: Normal rate, regular rhythm and  intact distal pulses.   Pulmonary/Chest: Effort normal and breath sounds normal. No respiratory distress. She has no wheezes.  Abdominal: Soft. Bowel sounds are normal. She exhibits no distension and no mass. There is no tenderness. There is no rebound and no guarding.  Musculoskeletal: Normal range of motion. She exhibits no edema or tenderness.  Neurological: She is alert and oriented to person, place, and time. She has normal strength. No cranial nerve deficit or sensory deficit. She displays a negative Romberg sign. GCS eye subscore is 4. GCS verbal subscore is 5. GCS motor subscore is 6.  Fine movement and coordination intact  Skin: Skin is warm and dry.  Psychiatric: She has a normal mood and affect.  Nursing note and vitals reviewed.    ED Treatments / Results  Labs (all labs ordered are listed, but only abnormal results are displayed) Labs Reviewed  COMPREHENSIVE METABOLIC PANEL - Abnormal; Notable for the following:       Result Value   Glucose, Bld 110 (*)    AST 9 (*)  ALT 12 (*)    All other components within normal limits  CBC - Abnormal; Notable for the following:    Hemoglobin 11.1 (*)    HCT 34.2 (*)    All other components within normal limits  LIPASE, BLOOD    EKG  EKG Interpretation None       Radiology No results found.  Procedures Procedures (including critical care time)  Medications Ordered in ED Medications  dexamethasone (DECADRON) injection 10 mg (10 mg Intravenous Given 07/26/17 1749)  ketorolac (TORADOL) 15 MG/ML injection 15 mg (15 mg Intravenous Given 07/26/17 1749)  diphenhydrAMINE (BENADRYL) injection 12.5 mg (12.5 mg Intravenous Given 07/26/17 1749)  sodium chloride 0.9 % bolus 500 mL (0 mLs Intravenous Stopped 07/26/17 1915)     Initial Impression / Assessment and Plan / ED Course  I have reviewed the triage vital signs and the nursing notes.  Pertinent labs & imaging results that were available during my care of the patient were  reviewed by me and considered in my medical decision making (see chart for details).     Patient presents with persistent headaches, controlled with over-the-counter ibuprofen and Tylenol. Yesterday she had 2 episodes of emesis, and she reports occasional dizziness. Physical exam reassuring, no neurological deficits. Her blood pressure is stable.Basic labs reassuring. Will give headache cocktail and IV fluids and reassess. Will assess orthostatic vital signs.  Headache resolved with headache cocktail. Orthostatic vital signs negative. She denies dizziness or nausea at this time. Discussed possible causes for headache. Discussed follow-up with primary care. At this time, patient appears safe for discharge. Return precautions given. Patient states she understands and agrees to plan.   Final Clinical Impressions(s) / ED Diagnoses   Final diagnoses:  Nonintractable headache, unspecified chronicity pattern, unspecified headache type    New Prescriptions Discharge Medication List as of 07/26/2017  7:15 PM       Alveria Apley, PA-C 07/27/17 0120    Doug Sou, MD 08/02/17 [phone]

## 2017-07-26 NOTE — ED Triage Notes (Addendum)
Pt reports HA on forehead since last Tuesday accompanied by vertigo. Began to have emesis yesterday, but is able to keep water down.

## 2017-07-26 NOTE — Discharge Instructions (Signed)
It is important that you stay well-hydrated with water. Make sure that you're eating well and regularly, at least 3 meals a day. Use Tylenol or ibuprofen as needed for headache. Follow-up with your primary care doctor in 1 week for evaluation of your headaches and evaluation of your blood pressure. Return to the emergency room if you develop vision changes, slurred speech, weakness, numbness, or any new or worsening symptoms.

## 2018-01-27 ENCOUNTER — Emergency Department (HOSPITAL_COMMUNITY)
Admission: EM | Admit: 2018-01-27 | Discharge: 2018-01-27 | Disposition: A | Discharge status: Home / Self Care | Payer: BLUE CROSS/BLUE SHIELD | Attending: Emergency Medicine | Admitting: Emergency Medicine

## 2018-01-27 ENCOUNTER — Encounter (HOSPITAL_COMMUNITY): Payer: Self-pay | Admitting: *Deleted

## 2018-01-27 ENCOUNTER — Other Ambulatory Visit: Payer: Self-pay

## 2018-01-27 DIAGNOSIS — J45909 Unspecified asthma, uncomplicated: Secondary | ICD-10-CM | POA: Insufficient documentation

## 2018-01-27 DIAGNOSIS — J020 Streptococcal pharyngitis: Secondary | ICD-10-CM | POA: Insufficient documentation

## 2018-01-27 DIAGNOSIS — Z79899 Other long term (current) drug therapy: Secondary | ICD-10-CM | POA: Insufficient documentation

## 2018-01-27 DIAGNOSIS — R070 Pain in throat: Secondary | ICD-10-CM | POA: Diagnosis present

## 2018-01-27 LAB — RAPID STREP SCREEN (MED CTR MEBANE ONLY): Streptococcus, Group A Screen (Direct): NEGATIVE

## 2018-01-27 MED ORDER — ACETAMINOPHEN 325 MG PO TABS
650.0000 mg | ORAL_TABLET | Freq: Once | ORAL | Status: AC
  Administered 2018-01-27: 650 mg via ORAL
  Filled 2018-01-27: qty 2

## 2018-01-27 MED ORDER — PHENOL 1.4 % MT LIQD: 1.0000 | OROMUCOSAL | 0 refills | Status: DC | PRN

## 2018-01-27 MED ORDER — DEXAMETHASONE 1 MG/ML PO CONC
10.0000 mg | Freq: Once | ORAL | Status: DC
  Filled 2018-01-27: qty 10

## 2018-01-27 MED ORDER — AMOXICILLIN 500 MG PO CAPS: 500.0000 mg | ORAL_CAPSULE | Freq: Two times a day (BID) | ORAL | 0 refills | Status: AC

## 2018-01-27 MED ORDER — DEXAMETHASONE 4 MG PO TABS
10.0000 mg | ORAL_TABLET | Freq: Once | ORAL | Status: AC
  Administered 2018-01-27: 10 mg via ORAL
  Filled 2018-01-27: qty 3

## 2018-01-27 NOTE — Discharge Instructions (Signed)
As discussed, make sure you take your entire course of antibiotics even if you feel better.  Drink plenty of fluids and make sure you stay well-hydrated.  Drink tea with honey, cough drops, and throat spray to help soothe your throat.  Follow-up with your primary care provider. Return if symptoms worsen, difficulty breathing, difficulty swallowing, drooling, or other new concerning symptoms in the meantime.

## 2018-01-27 NOTE — ED Provider Notes (Signed)
[facility] EMERGENCY DEPARTMENT Provider Note   CSN: [id number] Arrival date & time: 01/27/18  1510     History   Chief Complaint Chief Complaint  Patient presents with  . Sore Throat    HPI Lauren [name] is a 27 y.o. female with past medical history of asthma presenting with 2 days of sore throat.  She reports subjective fevers and chills.  Her husband was just diagnosed with positive strep.  She has young toddler's in daycare.  No nausea vomiting or other symptoms.  She reports odynophagia especially with solid foods.  She is tolerating fluids and oral secretions.  HPI  Past Medical History:  Diagnosis Date  . Allergy    allergic rhinitis  . Asthma   . Gestational diabetes    with first pregnancy  . Obesity   . Pyelonephritis 06/2016    Patient Active Problem List   Diagnosis Date Noted  . Postpartum state 07/10/2016  . Acute pyelonephritis 07/10/2016  . SIRS (systemic inflammatory response syndrome) (HCC) 07/10/2016  . Acute hypokalemia 07/10/2016  . Acute hyperglycemia 07/10/2016  . Anemia 07/10/2016  . Indication for care or intervention in labor or delivery 06/07/2016  . IUGR (intrauterine growth restriction) 09/16/2013  . Rh negative state in antepartum period 07/31/2013  . BMI 40.0-44.9, adult (HCC) 07/31/2013  . Allergic rhinitis 01/29/2013    Past Surgical History:  Procedure Laterality Date  . NO PAST SURGERIES    . WISDOM TOOTH EXTRACTION       OB History    Gravida  2   Para  2   Term  2   Preterm      AB      Living  2     SAB      TAB      Ectopic      Multiple  0   Live Births  2            Home Medications    Prior to Admission medications   Medication Sig Start Date End Date Taking? Authorizing Provider  Estradiol Cypionate (DEPO-ESTRADIOL IM) Inject into the muscle.   Yes [provider]  amoxicillin (AMOXIL) 500 MG capsule Take 1 capsule (500 mg total) by mouth 2 (two) times daily  for 10 days. 01/27/18 02/06/18  Mathews Robinsons B, PA-C  phenol ([employer] SORE THROAT SPRAY) 1.4 % LIQD Use as directed 1 spray in the mouth or throat as needed for throat irritation / pain. 01/27/18   Georgiana Shore, PA-C    Family History Family History  Problem Relation Age of Onset  . Hypertension Mother   . Diabetes Father   . Cancer Other   . Asthma Other     Social History Social History   Tobacco Use  . Smoking status: Never Smoker  . Smokeless tobacco: Never Used  Substance Use Topics  . Alcohol use: No  . Drug use: No     Allergies   Bee venom; Latex; and Other   Review of Systems Review of Systems  Constitutional: Positive for chills and fever.  HENT: Positive for sore throat. Negative for congestion, tinnitus and voice change.   Respiratory: Negative for cough, choking, chest tightness, shortness of breath, wheezing and stridor.   Cardiovascular: Negative for chest pain.  Gastrointestinal: Negative for abdominal pain, diarrhea, nausea and vomiting.  Musculoskeletal: Negative for myalgias, neck pain and neck stiffness.  Skin: Negative for color change, pallor and rash.  Neurological: Negative for  dizziness, light-headedness and headaches.     Physical Exam Updated Vital Signs BP 117/88 (BP Location: Right Arm)   Pulse 98   Temp 98.3 F (36.8 C) (Oral)   Resp 16   Ht 5\' 6"  (1.676 m)   Wt 114.3 kg (252 lb)   SpO2 100%   BMI 40.67 kg/m   Physical Exam  Constitutional: She appears well-developed and well-nourished.  Non-toxic appearance. She does not appear ill. No distress.  Afebrile, nontoxic-appearing, sitting comfortably in chair in no acute distress.  HENT:  Head: Normocephalic and atraumatic.  Right Ear: A middle ear effusion is present.  Left Ear: A middle ear effusion is present.  Mouth/Throat: Uvula is midline and mucous membranes are normal. Mucous membranes are not pale and not dry. No uvula swelling. Posterior oropharyngeal erythema  present. No oropharyngeal exudate, posterior oropharyngeal edema or tonsillar abscesses. Tonsils are 1+ on the right. Tonsils are 1+ on the left. Tonsillar exudate.  No gross oral abscess. Uvula is midline, arches are simmetrical and intact. No peritonsilar swelling, mild exudate. No trismus. Sublingual mucosa is soft and non-tender. Tolerating oral secretions. No concern for ludwig's angina.   Eyes: Conjunctivae and EOM are normal.  Neck: Normal range of motion. Neck supple.  Cardiovascular: Normal rate and regular rhythm.  No murmur heard. Pulmonary/Chest: Effort normal and breath sounds normal. No stridor. No respiratory distress. She has no wheezes. She has no rhonchi. She has no rales.  Musculoskeletal: She exhibits no edema.  Lymphadenopathy:    She has cervical adenopathy.  Neurological: She is alert.  Skin: Skin is warm and dry. No rash noted. She is not diaphoretic. No erythema. No pallor.  Psychiatric: She has a normal mood and affect.  Nursing note and vitals reviewed.    ED Treatments / Results  Labs (all labs ordered are listed, but only abnormal results are displayed) Labs Reviewed  RAPID STREP SCREEN (NOT AT [facility])  CULTURE, GROUP A STREP [facility]    EKG None  Radiology No results found.  Procedures Procedures (including critical care time)  Medications Ordered in ED Medications - No data to display   Initial Impression / Assessment and Plan / ED Course  I have reviewed the triage vital signs and the nursing notes.  Pertinent labs & imaging results that were available during my care of the patient were reviewed by me and considered in my medical decision making (see chart for details).    Pt febrile with tonsillar exudate, cervical lymphadenopathy, & odynophagia diagnosis of strep. Treated in the Ed with steroids.  Pt does not appear dehydrated, but discussed importance of water rehydration.   Presentation non concerning for PTA or infxn spread to soft  tissue. No trismus or uvula deviation. Specific return precautions discussed. Pt able to drink water in ED without difficulty with intact air way. Recommended PCP follow up.   Will treat with antibiotics given husband's diagnosis of strep and children in daycare making this likely to be strep despite negative rapid strep.  Oropharynx is erythematous patient has isolated sore throat.  Discharge home with symptomatic relief and close follow-up with PCP. Discussed strict return precautions and advised to return to the emergency department if experiencing any new or worsening symptoms. Instructions were understood and patient agreed with discharge plan. Final Clinical Impressions(s) / ED Diagnoses   Final diagnoses:  Strep pharyngitis    ED Discharge Orders        Ordered    amoxicillin (AMOXIL) 500 MG capsule  2 times daily     01/27/18 1806    phenol ([employer] SORE THROAT SPRAY) 1.4 % LIQD  As needed     01/27/18 1806       Gregary Cromer 01/27/18 1815    Azalia Bilis, MD 01/28/18 [phone]

## 2018-01-27 NOTE — ED Provider Notes (Signed)
Patient placed in Quick Look pathway, seen and evaluated   Chief Complaint: sore throat   HPI:   27 y.o. female who presents for evaluation of sore throat times 2 days.  Reports has been was recently diagnosed with strep. She is able to tolerate secretions but does report worsening pain with swallowing. No fevers, neck or facial swelling.   ROS: sore throat  Physical Exam:   Gen: No distress  Neuro: Awake and Alert  Skin: Warm    Focused Exam: Posterior oropharynx is erythematous, edematous.  Uvula is midline.  No trismus.  No neck or facial swelling.   Initiation of care has begun. The patient has been counseled on the process, plan, and necessity for staying for the completion/evaluation, and the remainder of the medical screening examination     Rosana HoesLayden, Lindsey A, PA-C 01/27/18 1620    Jacalyn LefevreHaviland, Julie, MD 01/27/18 1622

## 2018-01-27 NOTE — ED Triage Notes (Addendum)
Pt states sore throat since Sat and difficulty swallowing, though she is able to swallow liquids today. Husband just dx with strep.

## 2018-01-30 LAB — CULTURE, GROUP A STREP (THRC)

## 2018-04-21 ENCOUNTER — Encounter (HOSPITAL_COMMUNITY): Payer: Self-pay | Admitting: *Deleted

## 2018-04-21 ENCOUNTER — Emergency Department (HOSPITAL_COMMUNITY)
Admission: EM | Admit: 2018-04-21 | Discharge: 2018-04-22 | Disposition: A | Discharge status: Home / Self Care | Payer: BLUE CROSS/BLUE SHIELD | Attending: Emergency Medicine | Admitting: Emergency Medicine

## 2018-04-21 ENCOUNTER — Other Ambulatory Visit: Payer: Self-pay

## 2018-04-21 DIAGNOSIS — J45909 Unspecified asthma, uncomplicated: Secondary | ICD-10-CM | POA: Insufficient documentation

## 2018-04-21 DIAGNOSIS — J029 Acute pharyngitis, unspecified: Secondary | ICD-10-CM | POA: Insufficient documentation

## 2018-04-21 NOTE — ED Triage Notes (Signed)
Pt c/o sore throat since yesterday. Reports having similar symptoms 2 months ago and diagnosed with strep.

## 2018-04-22 LAB — MONONUCLEOSIS SCREEN: Mono Screen: NEGATIVE

## 2018-04-22 LAB — GROUP A STREP BY PCR: Group A Strep by PCR: NOT DETECTED

## 2018-04-22 MED ORDER — DEXAMETHASONE SODIUM PHOSPHATE 10 MG/ML IJ SOLN
10.0000 mg | Freq: Once | INTRAMUSCULAR | Status: AC
  Administered 2018-04-22: 10 mg via INTRAMUSCULAR
  Filled 2018-04-22: qty 1

## 2018-04-22 NOTE — Discharge Instructions (Signed)
You were seen in the ED today with sore throat. The mono and strep tests were negative. We gave a steroid shot which should work over the next 1-2 days to decrease swelling and pain. Return to the ED with any sudden worsening pain, trouble breathing, or other concerning symptoms. I included the name of an ENT doctor to call and schedule an appointment with if symptoms do not resolve in the next 1-2 weeks.

## 2018-04-22 NOTE — ED Provider Notes (Signed)
Emergency Department Provider Note   I have reviewed the triage vital signs and the nursing notes.   HISTORY  Chief Complaint Sore Throat   HPI Lauren [name] is a 27 y.o. female presents to the emergency department for evaluation of sore throat for the past 3 months.  The patient states she was treated for possible early strep throat in April.  She notes at that time her husband had strep throat and she was empirically treated.  She states her symptoms improved slightly but then returned and she has had constant sore throat since that time.  She denies any fevers or chills.  She has some pain with swallowing.  Denies any rash or difficulty breathing.  She is been taking over-the-counter medications with no relief.   Past Medical History:  Diagnosis Date  . Allergy    allergic rhinitis  . Asthma   . Gestational diabetes    with first pregnancy  . Obesity   . Pyelonephritis 06/2016    Patient Active Problem List   Diagnosis Date Noted  . Postpartum state 07/10/2016  . Acute pyelonephritis 07/10/2016  . SIRS (systemic inflammatory response syndrome) (HCC) 07/10/2016  . Acute hypokalemia 07/10/2016  . Acute hyperglycemia 07/10/2016  . Anemia 07/10/2016  . Indication for care or intervention in labor or delivery 06/07/2016  . IUGR (intrauterine growth restriction) 09/16/2013  . Rh negative state in antepartum period 07/31/2013  . BMI 40.0-44.9, adult (HCC) 07/31/2013  . Allergic rhinitis 01/29/2013    Past Surgical History:  Procedure Laterality Date  . NO PAST SURGERIES    . WISDOM TOOTH EXTRACTION      Current Outpatient Rx  . Order #: [id number] Class: Historical Med  . Order #: [id number] Class: Print    Allergies Bee venom; Latex; and Other  Family History  Problem Relation Age of Onset  . Hypertension Mother   . Diabetes Father   . Cancer Other   . Asthma Other     Social History Social History   Tobacco Use  . Smoking status: Never Smoker  .  Smokeless tobacco: Never Used  Substance Use Topics  . Alcohol use: No  . Drug use: No    Review of Systems  Constitutional: No fever/chills Eyes: No visual changes. ENT: Positive sore throat. Cardiovascular: Denies chest pain. Respiratory: Denies shortness of breath. Gastrointestinal: No abdominal pain.  No nausea, no vomiting.  No diarrhea.  No constipation. Genitourinary: Negative for dysuria. Musculoskeletal: Negative for back pain. Skin: Negative for rash. Neurological: Negative for headaches, focal weakness or numbness.  10-point ROS otherwise negative.  ____________________________________________   PHYSICAL EXAM:  VITAL SIGNS: ED Triage Vitals  Enc Vitals Group     BP 04/21/18 2319 (!) 173/93     Pulse Rate 04/21/18 2319 (!) 105     Resp 04/21/18 2319 16     Temp 04/21/18 2319 98.9 F (37.2 C)     Temp Source 04/21/18 2319 Oral     SpO2 04/21/18 2319 100 %     Pain Score 04/21/18 2318 9   Constitutional: Alert and oriented. Well appearing and in no acute distress. Eyes: Conjunctivae are normal.  Head: Atraumatic. Nose: No congestion/rhinnorhea. Mouth/Throat: Mucous membranes are moist.  Oropharynx with mild erythema. No exudate. No PTA.  Neck: No stridor. Cardiovascular: Normal rate, regular rhythm. Good peripheral circulation. Grossly normal heart sounds.   Respiratory: Normal respiratory effort. Gastrointestinal: No distention.  Musculoskeletal: No lower extremity tenderness nor edema. No gross deformities of extremities. Neurologic:  Normal speech and language. No gross focal neurologic deficits are appreciated.  Skin:  Skin is warm, dry and intact. No rash noted.  ____________________________________________   LABS (all labs ordered are listed, but only abnormal results are displayed)  Labs Reviewed  GROUP A STREP BY PCR  MONONUCLEOSIS SCREEN   ____________________________________________   PROCEDURES  Procedure(s) performed:    Procedures  None ____________________________________________   INITIAL IMPRESSION / ASSESSMENT AND PLAN / ED COURSE  Pertinent labs & imaging results that were available during my care of the patient were reviewed by me and considered in my medical decision making (see chart for details).  Since to the emergency department with sore throat for the past 3 months.  She was treated empirically for strep throat in April with only interval improvement.  Patient given Decadron.  No concern for peritonsillar or retropharyngeal abscess.  Will follow mono screen and likely discharge.  02:20 PM Mono negative.  Strep negative.  Plan for Decadron and discharged home with continued supportive care and PCP follow-up.  I did include the contact information for ENT given 3 months of symptoms not responding to antibiotics.   At this time, I do not feel there is any life-threatening condition present. I have reviewed and discussed all results (EKG, imaging, lab, urine as appropriate), exam findings with patient. I have reviewed nursing notes and appropriate previous records.  I feel the patient is safe to be discharged home without further emergent workup. Discussed usual and customary return precautions. Patient and family (if present) verbalize understanding and are comfortable with this plan.  Patient will follow-up with their primary care provider. If they do not have a primary care provider, information for follow-up has been provided to them. All questions have been answered.  ____________________________________________  FINAL CLINICAL IMPRESSION(S) / ED DIAGNOSES  Final diagnoses:  Viral pharyngitis     MEDICATIONS GIVEN DURING THIS VISIT:  Medications  dexamethasone (DECADRON) injection 10 mg (10 mg Intramuscular Given 04/22/18 0135)    Note:  This document was prepared using Dragon voice recognition software and may include unintentional dictation errors.  Alona BeneJoshua Leonia Heatherly, MD Emergency  Medicine    Gregory Dowe, Arlyss RepressJoshua G, MD 04/22/18 Earle [id number]

## 2018-12-19 ENCOUNTER — Emergency Department (HOSPITAL_COMMUNITY)
Admission: EM | Admit: 2018-12-19 | Discharge: 2018-12-19 | Disposition: A | Discharge status: Home / Self Care | Payer: BLUE CROSS/BLUE SHIELD | Attending: Emergency Medicine | Admitting: Emergency Medicine

## 2018-12-19 ENCOUNTER — Other Ambulatory Visit: Payer: Self-pay

## 2018-12-19 ENCOUNTER — Emergency Department (HOSPITAL_COMMUNITY): Payer: BLUE CROSS/BLUE SHIELD

## 2018-12-19 ENCOUNTER — Encounter (HOSPITAL_COMMUNITY): Payer: Self-pay | Admitting: *Deleted

## 2018-12-19 DIAGNOSIS — J111 Influenza due to unidentified influenza virus with other respiratory manifestations: Secondary | ICD-10-CM | POA: Insufficient documentation

## 2018-12-19 DIAGNOSIS — Z91040 Latex allergy status: Secondary | ICD-10-CM | POA: Insufficient documentation

## 2018-12-19 DIAGNOSIS — R509 Fever, unspecified: Secondary | ICD-10-CM | POA: Diagnosis present

## 2018-12-19 DIAGNOSIS — J45909 Unspecified asthma, uncomplicated: Secondary | ICD-10-CM | POA: Diagnosis not present

## 2018-12-19 DIAGNOSIS — R69 Illness, unspecified: Secondary | ICD-10-CM

## 2018-12-19 LAB — CBC WITH DIFFERENTIAL/PLATELET
ABS IMMATURE GRANULOCYTES: 0.03 10*3/uL (ref 0.00–0.07)
BASOS PCT: 0 %
Basophils Absolute: 0 10*3/uL (ref 0.0–0.1)
EOS ABS: 0 10*3/uL (ref 0.0–0.5)
EOS PCT: 0 %
HCT: 34.5 % — ABNORMAL LOW (ref 36.0–46.0)
Hemoglobin: 10.9 g/dL — ABNORMAL LOW (ref 12.0–15.0)
Immature Granulocytes: 1 %
Lymphocytes Relative: 17 %
Lymphs Abs: 1 10*3/uL (ref 0.7–4.0)
MCH: 27.6 pg (ref 26.0–34.0)
MCHC: 31.6 g/dL (ref 30.0–36.0)
MCV: 87.3 fL (ref 80.0–100.0)
MONO ABS: 0.7 10*3/uL (ref 0.1–1.0)
MONOS PCT: 13 %
NEUTROS ABS: 3.9 10*3/uL (ref 1.7–7.7)
Neutrophils Relative %: 69 %
PLATELETS: 205 10*3/uL (ref 150–400)
RBC: 3.95 MIL/uL (ref 3.87–5.11)
RDW: 13.4 % (ref 11.5–15.5)
WBC: 5.7 10*3/uL (ref 4.0–10.5)
nRBC: 0 % (ref 0.0–0.2)

## 2018-12-19 LAB — URINALYSIS, ROUTINE W REFLEX MICROSCOPIC
BILIRUBIN URINE: NEGATIVE
GLUCOSE, UA: NEGATIVE mg/dL
Hgb urine dipstick: NEGATIVE
KETONES UR: 20 mg/dL — AB
Leukocytes,Ua: NEGATIVE
NITRITE: NEGATIVE
PH: 7 (ref 5.0–8.0)
PROTEIN: NEGATIVE mg/dL
Specific Gravity, Urine: 1.023 (ref 1.005–1.030)

## 2018-12-19 LAB — COMPREHENSIVE METABOLIC PANEL
ALT: 9 U/L (ref 0–44)
ANION GAP: 6 (ref 5–15)
AST: 8 U/L — ABNORMAL LOW (ref 15–41)
Albumin: 3.8 g/dL (ref 3.5–5.0)
Alkaline Phosphatase: 44 U/L (ref 38–126)
BUN: 8 mg/dL (ref 6–20)
CALCIUM: 8.5 mg/dL — AB (ref 8.9–10.3)
CHLORIDE: 105 mmol/L (ref 98–111)
CO2: 24 mmol/L (ref 22–32)
CREATININE: 0.67 mg/dL (ref 0.44–1.00)
Glucose, Bld: 115 mg/dL — ABNORMAL HIGH (ref 70–99)
Potassium: 3.5 mmol/L (ref 3.5–5.1)
SODIUM: 135 mmol/L (ref 135–145)
Total Bilirubin: 0.7 mg/dL (ref 0.3–1.2)
Total Protein: 7.4 g/dL (ref 6.5–8.1)

## 2018-12-19 LAB — I-STAT BETA HCG BLOOD, ED (MC, WL, AP ONLY)

## 2018-12-19 LAB — LIPASE, BLOOD: LIPASE: 28 U/L (ref 11–51)

## 2018-12-19 MED ORDER — OSELTAMIVIR PHOSPHATE 75 MG PO CAPS: 75.0000 mg | ORAL_CAPSULE | Freq: Two times a day (BID) | ORAL | 0 refills | Status: DC

## 2018-12-19 MED ORDER — SODIUM CHLORIDE (PF) 0.9 % IJ SOLN
INTRAMUSCULAR | Status: AC
  Filled 2018-12-19: qty 50

## 2018-12-19 MED ORDER — ACETAMINOPHEN 325 MG PO TABS
650.0000 mg | ORAL_TABLET | Freq: Once | ORAL | Status: AC | PRN
  Administered 2018-12-19: 650 mg via ORAL
  Filled 2018-12-19: qty 2

## 2018-12-19 MED ORDER — ONDANSETRON HCL 4 MG/2ML IJ SOLN
4.0000 mg | Freq: Once | INTRAMUSCULAR | Status: AC
  Administered 2018-12-19: 4 mg via INTRAVENOUS
  Filled 2018-12-19: qty 2

## 2018-12-19 MED ORDER — IOPAMIDOL (ISOVUE-300) INJECTION 61%
INTRAVENOUS | Status: AC
  Filled 2018-12-19: qty 100

## 2018-12-19 MED ORDER — MORPHINE SULFATE (PF) 4 MG/ML IV SOLN
4.0000 mg | Freq: Once | INTRAVENOUS | Status: AC
  Administered 2018-12-19: 4 mg via INTRAVENOUS
  Filled 2018-12-19: qty 1

## 2018-12-19 MED ORDER — IOPAMIDOL (ISOVUE-300) INJECTION 61%
100.0000 mL | Freq: Once | INTRAVENOUS | Status: AC | PRN
  Administered 2018-12-19: 100 mL via INTRAVENOUS

## 2018-12-19 MED ORDER — SODIUM CHLORIDE 0.9 % IV BOLUS
1000.0000 mL | Freq: Once | INTRAVENOUS | Status: AC
  Administered 2018-12-19: 1000 mL via INTRAVENOUS

## 2018-12-19 NOTE — ED Provider Notes (Addendum)
[facility]-EMERGENCY DEPT Provider Note   CSN: [id number] Arrival date & time: 12/19/18  0057    History   Chief Complaint Chief Complaint  Patient presents with  . Fever  . Abdominal Pain    HPI Lauren [name] is a 28 y.o. female.     Patient presents to the emergency department with a chief complaint of abdominal pain.  She states pain started yesterday.  It is been gradually worsening.  She reports associated fever and chills, headache.  States that she has now had nausea and an episode of vomiting.  Denies any productive cough.  Denies any dysuria or hematuria.  States her pain is moderate.  The history is provided by the patient. No language interpreter was used.    Past Medical History:  Diagnosis Date  . Allergy    allergic rhinitis  . Asthma   . Gestational diabetes    with first pregnancy  . Obesity   . Pyelonephritis 06/2016    Patient Active Problem List   Diagnosis Date Noted  . Postpartum state 07/10/2016  . Acute pyelonephritis 07/10/2016  . SIRS (systemic inflammatory response syndrome) (HCC) 07/10/2016  . Acute hypokalemia 07/10/2016  . Acute hyperglycemia 07/10/2016  . Anemia 07/10/2016  . Indication for care or intervention in labor or delivery 06/07/2016  . IUGR (intrauterine growth restriction) 09/16/2013  . Rh negative state in antepartum period 07/31/2013  . BMI 40.0-44.9, adult (HCC) 07/31/2013  . Allergic rhinitis 01/29/2013    Past Surgical History:  Procedure Laterality Date  . NO PAST SURGERIES    . WISDOM TOOTH EXTRACTION       OB History    Gravida  2   Para  2   Term  2   Preterm      AB      Living  2     SAB      TAB      Ectopic      Multiple  0   Live Births  2            Home Medications    Prior to Admission medications   Medication Sig Start Date End Date Taking? Authorizing Provider  Estradiol Cypionate (DEPO-ESTRADIOL IM) Inject into the muscle.    [provider]  phenol ([employer] SORE THROAT SPRAY) 1.4 % LIQD Use as directed 1 spray in the mouth or throat as needed for throat irritation / pain. 01/27/18   Georgiana Shore, PA-C    Family History Family History  Problem Relation Age of Onset  . Hypertension Mother   . Diabetes Father   . Cancer Other   . Asthma Other     Social History Social History   Tobacco Use  . Smoking status: Never Smoker  . Smokeless tobacco: Never Used  Substance Use Topics  . Alcohol use: No  . Drug use: No     Allergies   Bee venom; Latex; and Other   Review of Systems Review of Systems  All other systems reviewed and are negative.    Physical Exam Updated Vital Signs BP (!) 146/82 (BP Location: Left Arm)   Pulse (!) 117   Temp (!) 102.8 F (39.3 C) (Oral)   Resp 20   Ht 5\' 6"  (1.676 m)   Wt 127 kg   LMP 12/05/2018 (Approximate)   BMI 45.19 kg/m   Physical Exam Vitals signs and nursing note reviewed.  Constitutional:      Appearance:  She is well-developed.  HENT:     Head: Normocephalic and atraumatic.  Eyes:     Conjunctiva/sclera: Conjunctivae normal.     Pupils: Pupils are equal, round, and reactive to light.  Neck:     Musculoskeletal: Normal range of motion and neck supple.  Cardiovascular:     Rate and Rhythm: Normal rate and regular rhythm.     Heart sounds: No murmur. No friction rub. No gallop.   Pulmonary:     Effort: Pulmonary effort is normal. No respiratory distress.     Breath sounds: Normal breath sounds. No wheezing or rales.  Chest:     Chest wall: No tenderness.  Abdominal:     General: Bowel sounds are normal. There is no distension.     Palpations: Abdomen is soft. There is no mass.     Tenderness: There is abdominal tenderness in the right lower quadrant and left lower quadrant. There is no guarding or rebound.  Musculoskeletal: Normal range of motion.        General: No tenderness.  Skin:    General: Skin is warm and dry.  Neurological:      Mental Status: She is alert and oriented to person, place, and time.  Psychiatric:        Behavior: Behavior normal.        Thought Content: Thought content normal.        Judgment: Judgment normal.      ED Treatments / Results  Labs (all labs ordered are listed, but only abnormal results are displayed) Labs Reviewed  CBC WITH DIFFERENTIAL/PLATELET - Abnormal; Notable for the following components:      Result Value   Hemoglobin 10.9 (*)    HCT 34.5 (*)    All other components within normal limits  COMPREHENSIVE METABOLIC PANEL - Abnormal; Notable for the following components:   Glucose, Bld 115 (*)    Calcium 8.5 (*)    AST 8 (*)    All other components within normal limits  URINALYSIS, ROUTINE W REFLEX MICROSCOPIC - Abnormal; Notable for the following components:   Color, Urine STRAW (*)    APPearance HAZY (*)    Ketones, ur 20 (*)    All other components within normal limits  LIPASE, BLOOD  I-STAT BETA HCG BLOOD, ED ([facility], [facility], [facility] ONLY)    EKG None  Radiology Ct Abdomen Pelvis W Contrast  Result Date: 12/19/2018 CLINICAL DATA:  28 year old female with abdominal pain. Concern for acute appendicitis. EXAM: CT ABDOMEN AND PELVIS WITH CONTRAST TECHNIQUE: Multidetector CT imaging of the abdomen and pelvis was performed using the standard protocol following bolus administration of intravenous contrast. CONTRAST:  ISOVUE-300 IOPAMIDOL (ISOVUE-300) INJECTION 61% COMPARISON:  Pelvic ultrasound dated 07/10/2016 FINDINGS: Lower chest: Minimal bibasilar dependent atelectasis. No intra-abdominal free air or free fluid. Hepatobiliary: Subcentimeter right hepatic hypodense lesion is too small to characterize. The liver is otherwise unremarkable. No intrahepatic biliary ductal dilatation. The gallbladder contains a fold otherwise unremarkable. Pancreas: Unremarkable. No pancreatic ductal dilatation or surrounding inflammatory changes. Spleen: Normal in size without focal abnormality.  Adrenals/Urinary Tract: The adrenal glands are unremarkable. There is a 1.5 cm left renal hypodense lesion, suboptimally characterized but demonstrates fluid attenuation most consistent with a cyst. There is no hydronephrosis or nephrolithiasis on either side. The visualized ureters and urinary bladder appear unremarkable. Stomach/Bowel: There is no bowel obstruction or active inflammation. Normal appendix. Vascular/Lymphatic: The abdominal aorta and IVC appear unremarkable. No portal venous gas. There is no adenopathy.  Reproductive: The uterus and ovaries are grossly unremarkable. No pelvic mass. Other: None Musculoskeletal: No acute or significant osseous findings. IMPRESSION: No acute intra-abdominal or pelvic pathology. No bowel obstruction or active inflammation. Normal appendix. Electronically Signed   By: Elgie Collard M.D.   On: 12/19/2018 03:35    Procedures Procedures (including critical care time)  Medications Ordered in ED Medications  morphine 4 MG/ML injection 4 mg (has no administration in time range)  ondansetron (ZOFRAN) injection 4 mg (has no administration in time range)  sodium chloride 0.9 % bolus 1,000 mL (has no administration in time range)  acetaminophen (TYLENOL) tablet 650 mg (650 mg Oral Given 12/19/18 0116)     Initial Impression / Assessment and Plan / ED Course  I have reviewed the triage vital signs and the nursing notes.  Pertinent labs & imaging results that were available during my care of the patient were reviewed by me and considered in my medical decision making (see chart for details).        Patient with lower abdominal tenderness, she does have focal tenderness in the right lower quadrant and guards during my exam.  She is noted to be febrile.  Onset of pain was yesterday, onset of fever was today.  Will check labs, treat fever, treat pain, and check CT.  CT negative for acute findings.  Will treat for influenza.  Return precautions  given.  Final Clinical Impressions(s) / ED Diagnoses   Final diagnoses:  Influenza-like illness    ED Discharge Orders         Ordered    oseltamivir (TAMIFLU) 75 MG capsule  Every 12 hours     12/19/18 0346           Roxy Horseman, PA-C 12/19/18 0545    Roxy Horseman, PA-C 12/19/18 1610    Zadie Rhine, MD 12/19/18 [id number]

## 2018-12-19 NOTE — ED Notes (Signed)
Pt to CT

## 2018-12-19 NOTE — ED Notes (Signed)
Pt ambulatory to the bathroom w/o assistance  

## 2018-12-19 NOTE — ED Triage Notes (Signed)
Pt c/o fever, generalized body aches.  Pt denies n/v/d.

## 2019-07-11 ENCOUNTER — Ambulatory Visit (HOSPITAL_COMMUNITY)
Admission: EM | Admit: 2019-07-11 | Discharge: 2019-07-11 | Disposition: A | Discharge status: Home / Self Care | Payer: BLUE CROSS/BLUE SHIELD | Attending: Emergency Medicine | Admitting: Emergency Medicine

## 2019-07-11 ENCOUNTER — Encounter (HOSPITAL_COMMUNITY): Payer: Self-pay

## 2019-07-11 ENCOUNTER — Other Ambulatory Visit: Payer: Self-pay

## 2019-07-11 DIAGNOSIS — H60501 Unspecified acute noninfective otitis externa, right ear: Secondary | ICD-10-CM

## 2019-07-11 MED ORDER — NEOMYCIN-POLYMYXIN-HC 3.5-10000-1 OT SUSP: 4.0000 [drp] | Freq: Three times a day (TID) | OTIC | 0 refills | Status: DC

## 2019-07-11 NOTE — Discharge Instructions (Signed)
Limit water in the ear and use of earbuds/ ear plugs.  Ear drops as prescribed.  If symptoms worsen or do not improve in the next week to return to be seen or to follow up with your PCP.

## 2019-07-11 NOTE — ED Provider Notes (Signed)
[place]    CSN: [id number] Arrival date & time: 07/11/19  1515      History   Chief Complaint Chief Complaint  Patient presents with  . Otalgia    HPI Lauren [name] is a 28 y.o. female.   Lauren [name] presents with complaints of right ear pain which started two days ago. Has noted white discharge. Pain with touch or laying on the ear. No specific hearing loss. Endorses some water in the ear when has washed her hair. 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 No URI symptoms. No fevers. Denies  Any previous similar. History  Of allergies, asthma, obesity.    ROS per HPI, negative if not otherwise mentioned.      Past Medical History:  Diagnosis Date  . Allergy    allergic rhinitis  . Asthma   . Gestational diabetes    with first pregnancy  . Obesity   . Pyelonephritis 06/2016    Patient Active Problem List   Diagnosis Date Noted  . Postpartum state 07/10/2016  . Acute pyelonephritis 07/10/2016  . SIRS (systemic inflammatory response syndrome) ([place]) 07/10/2016  . Acute hypokalemia 07/10/2016  . Acute hyperglycemia 07/10/2016  . Anemia 07/10/2016  . Indication for care or intervention in labor or delivery 06/07/2016  . IUGR (intrauterine growth restriction) 09/16/2013  . Rh negative state in antepartum period 07/31/2013  . BMI 40.0-44.9, adult ([place]) 07/31/2013  . Allergic rhinitis 01/29/2013    Past Surgical History:  Procedure Laterality Date  . NO PAST SURGERIES    . WISDOM TOOTH EXTRACTION      OB History    Gravida  2   Para  2   Term  2   Preterm      AB      Living  2     SAB      TAB      Ectopic      Multiple  0   Live Births  2            Home Medications    Prior to Admission medications   Medication Sig Start Date End Date Taking? Authorizing Provider  neomycin-polymyxin-hydrocortisone (CORTISPORIN) 3.5-10000-1 OTIC suspension Place 4 drops into the right ear 3 (three)  times daily. 07/11/19   Zigmund Gottron, NP  oseltamivir (TAMIFLU) 75 MG capsule Take 1 capsule (75 mg total) by mouth every 12 (twelve) hours. 12/19/18   Montine Circle, PA-C  phenol ([employer] SORE THROAT SPRAY) 1.4 % LIQD Use as directed 1 spray in the mouth or throat as needed for throat irritation / pain. Patient not taking: Reported on 12/19/2018 01/27/18   Dossie Der    Family History Family History  Problem Relation Age of Onset  . Hypertension Mother   . Diabetes Father   . Cancer Other   . Asthma Other     Social History Social History   Tobacco Use  . Smoking status: Never Smoker  . Smokeless tobacco: Never Used  Substance Use Topics  . Alcohol use: No  . Drug use: No     Allergies   Bee venom, Latex, and Other   Review of Systems Review of Systems   Physical Exam Triage Vital Signs ED Triage Vitals  Enc Vitals Group     BP 07/11/19 1551 140/83     Pulse Rate 07/11/19 1551 91     Resp 07/11/19 1551 18  Temp 07/11/19 1551 98.3 F (36.8 C)     Temp src --      SpO2 07/11/19 1551 98 %     Weight --      Height --      Head Circumference --      Peak Flow --      Pain Score 07/11/19 1550 9     Pain Loc --      Pain Edu? --      Excl. in GC? --    No data found.  Updated Vital Signs BP 140/83   Pulse 91   Temp 98.3 F (36.8 C)   Resp 18   LMP 06/06/2019   SpO2 98%   Visual Acuity Right Eye Distance:   Left Eye Distance:   Bilateral Distance:    Right Eye Near:   Left Eye Near:    Bilateral Near:     Physical Exam Constitutional:      General: She is not in acute distress.    Appearance: She is well-developed.  HENT:     Right Ear: Drainage, swelling and tenderness present. No mastoid tenderness.     Left Ear: Tympanic membrane and ear canal normal.     Ears:     Comments: Canal with swelling, primarily to the more lateral aspect of, deeper canal less swelling; mild redness; tm appears WNL; white discharge noted   Cardiovascular:     Rate and Rhythm: Normal rate.  Pulmonary:     Effort: Pulmonary effort is normal.  Skin:    General: Skin is warm and dry.  Neurological:     Mental Status: She is alert and oriented to person, place, and time.      UC Treatments / Results  Labs (all labs ordered are listed, but only abnormal results are displayed) Labs Reviewed - No data to display  EKG   Radiology No results found.  Procedures Procedures (including critical care time)  Medications Ordered in UC Medications - No data to display  Initial Impression / Assessment and Plan / UC Course  I have reviewed the triage vital signs and the nursing notes.  Pertinent labs & imaging results that were available during my care of the patient were reviewed by me and considered in my medical decision making (see chart for details).     Right AOE. Cortisporin provided. Return precautions provided. Patient verbalized understanding and agreeable to plan.   Final Clinical Impressions(s) / UC Diagnoses   Final diagnoses:  Acute otitis externa of right ear, unspecified type     Discharge Instructions     Limit water in the ear and use of earbuds/ ear plugs.  Ear drops as prescribed.  If symptoms worsen or do not improve in the next week to return to be seen or to follow up with your PCP.     ED Prescriptions    Medication Sig Dispense Auth. Provider   neomycin-polymyxin-hydrocortisone (CORTISPORIN) 3.5-10000-1 OTIC suspension Place 4 drops into the right ear 3 (three) times daily. 10 mL Georgetta HaberBurky,  B, NP     PDMP not reviewed this encounter.   Georgetta HaberBurky,  B, NP 07/11/19 1637

## 2019-07-11 NOTE — ED Triage Notes (Signed)
Pt presents with right ear pain x 3 days. Denies any other symptoms

## 2019-07-13 ENCOUNTER — Emergency Department (HOSPITAL_COMMUNITY)
Admission: EM | Admit: 2019-07-13 | Discharge: 2019-07-13 | Disposition: A | Discharge status: Home / Self Care | Payer: Medicaid Other | Attending: Emergency Medicine | Admitting: Emergency Medicine

## 2019-07-13 ENCOUNTER — Encounter (HOSPITAL_COMMUNITY): Payer: Self-pay | Admitting: Emergency Medicine

## 2019-07-13 ENCOUNTER — Other Ambulatory Visit: Payer: Self-pay

## 2019-07-13 DIAGNOSIS — Z91040 Latex allergy status: Secondary | ICD-10-CM | POA: Insufficient documentation

## 2019-07-13 DIAGNOSIS — J45909 Unspecified asthma, uncomplicated: Secondary | ICD-10-CM | POA: Insufficient documentation

## 2019-07-13 DIAGNOSIS — H60501 Unspecified acute noninfective otitis externa, right ear: Secondary | ICD-10-CM | POA: Insufficient documentation

## 2019-07-13 MED ORDER — AMOXICILLIN-POT CLAVULANATE 875-125 MG PO TABS: 1.0000 | ORAL_TABLET | Freq: Two times a day (BID) | ORAL | 0 refills | Status: AC

## 2019-07-13 NOTE — Discharge Instructions (Signed)
Return if any problems.

## 2019-07-13 NOTE — ED Provider Notes (Signed)
[facility] EMERGENCY DEPARTMENT Provider Note   CSN: [id number] Arrival date & time: 07/13/19  1038     History   Chief Complaint Chief Complaint  Patient presents with  . Otalgia    HPI Lauren [name] is a 28 y.o. female.     The history is provided by the patient. No language interpreter was used.  Otalgia Location:  Right Behind ear:  No abnormality Quality:  Aching Severity:  Mild Onset quality:  Gradual Duration:  5 days Timing:  Constant Progression:  Worsening Chronicity:  New Relieved by:  Nothing Worsened by:  Nothing Associated symptoms: ear discharge     Past Medical History:  Diagnosis Date  . Allergy    allergic rhinitis  . Asthma   . Gestational diabetes    with first pregnancy  . Obesity   . Pyelonephritis 06/2016    Patient Active Problem List   Diagnosis Date Noted  . Postpartum state 07/10/2016  . Acute pyelonephritis 07/10/2016  . SIRS (systemic inflammatory response syndrome) (HCC) 07/10/2016  . Acute hypokalemia 07/10/2016  . Acute hyperglycemia 07/10/2016  . Anemia 07/10/2016  . Indication for care or intervention in labor or delivery 06/07/2016  . IUGR (intrauterine growth restriction) 09/16/2013  . Rh negative state in antepartum period 07/31/2013  . BMI 40.0-44.9, adult (HCC) 07/31/2013  . Allergic rhinitis 01/29/2013    Past Surgical History:  Procedure Laterality Date  . NO PAST SURGERIES    . WISDOM TOOTH EXTRACTION       OB History    Gravida  2   Para  2   Term  2   Preterm      AB      Living  2     SAB      TAB      Ectopic      Multiple  0   Live Births  2            Home Medications    Prior to Admission medications   Medication Sig Start Date End Date Taking? Authorizing Provider  neomycin-polymyxin-hydrocortisone (CORTISPORIN) 3.5-10000-1 OTIC suspension Place 4 drops into the right ear 3 (three) times daily. 07/11/19   Georgetta Haber, NP  oseltamivir  (TAMIFLU) 75 MG capsule Take 1 capsule (75 mg total) by mouth every 12 (twelve) hours. 12/19/18   Roxy Horseman, PA-C  phenol ([employer] SORE THROAT SPRAY) 1.4 % LIQD Use as directed 1 spray in the mouth or throat as needed for throat irritation / pain. Patient not taking: Reported on 12/19/2018 01/27/18   Gregary Cromer    Family History Family History  Problem Relation Age of Onset  . Hypertension Mother   . Diabetes Father   . Cancer Other   . Asthma Other     Social History Social History   Tobacco Use  . Smoking status: Never Smoker  . Smokeless tobacco: Never Used  Substance Use Topics  . Alcohol use: No  . Drug use: No     Allergies   Bee venom, Latex, and Other   Review of Systems Review of Systems  HENT: Positive for ear discharge and ear pain.   All other systems reviewed and are negative.    Physical Exam Updated Vital Signs BP 130/75 (BP Location: Left Arm)   Pulse 80   Temp 99.4 F (37.4 C) (Oral)   Resp 14   SpO2 99%   Physical Exam Vitals signs and nursing note reviewed.  Constitutional:      Appearance: She is well-developed.  HENT:     Head: Normocephalic.     Left Ear: Tympanic membrane normal.     Ears:     Comments: Right canal erythematous, swollen,  Tm obscurred     Mouth/Throat:     Mouth: Mucous membranes are moist.  Eyes:     Pupils: Pupils are equal, round, and reactive to light.  Neck:     Musculoskeletal: Normal range of motion.  Cardiovascular:     Rate and Rhythm: Normal rate and regular rhythm.  Pulmonary:     Effort: Pulmonary effort is normal.  Abdominal:     General: There is no distension.  Musculoskeletal: Normal range of motion.  Skin:    General: Skin is warm.  Neurological:     Mental Status: She is alert and oriented to person, place, and time.  Psychiatric:        Mood and Affect: Mood normal.      ED Treatments / Results  Labs (all labs ordered are listed, but only abnormal results are  displayed) Labs Reviewed - No data to display  EKG None  Radiology No results found.  Procedures Procedures (including critical care time)  Medications Ordered in ED Medications - No data to display   Initial Impression / Assessment and Plan / ED Course  I have reviewed the triage vital signs and the nursing notes.  Pertinent labs & imaging results that were available during my care of the patient were reviewed by me and considered in my medical decision making (see chart for details).        MDM   Pt advised to continue cortisporin Pt given rx for augmentin Ibuprofen or tylenol for pain  Final Clinical Impressions(s) / ED Diagnoses   Final diagnoses:  Acute otitis externa of right ear, unspecified type    ED Discharge Orders         Ordered    amoxicillin-clavulanate (AUGMENTIN) 875-125 MG tablet  Every 12 hours     07/13/19 1253           Fransico Meadow, [place] 07/13/19 [place], MD 07/13/19 1640

## 2019-07-13 NOTE — ED Notes (Signed)
Pt verbalized understanding of discharge instructions and follow up care. Pt alert and oriented X4, ambulatory to lobby with steady gait.

## 2019-07-13 NOTE — ED Triage Notes (Signed)
Pt reports right ear pain with fluid in ear since last Thursday- was given drops on Saturday at [facility] but reports pain in right ear is no better.

## 2019-08-31 ENCOUNTER — Ambulatory Visit
Admission: EM | Admit: 2019-08-31 | Discharge: 2019-08-31 | Disposition: A | Discharge status: Home / Self Care | Payer: Medicaid Other | Attending: Physician Assistant | Admitting: Physician Assistant

## 2019-08-31 ENCOUNTER — Other Ambulatory Visit: Payer: Self-pay

## 2019-08-31 DIAGNOSIS — R350 Frequency of micturition: Secondary | ICD-10-CM | POA: Insufficient documentation

## 2019-08-31 DIAGNOSIS — N898 Other specified noninflammatory disorders of vagina: Secondary | ICD-10-CM | POA: Insufficient documentation

## 2019-08-31 LAB — POCT URINALYSIS DIP (MANUAL ENTRY)
Bilirubin, UA: NEGATIVE
Blood, UA: NEGATIVE
Glucose, UA: NEGATIVE mg/dL
Ketones, POC UA: NEGATIVE mg/dL
Leukocytes, UA: NEGATIVE
Nitrite, UA: NEGATIVE
Protein Ur, POC: NEGATIVE mg/dL
Spec Grav, UA: >=1.03 — AB (ref 1.010–1.025)
Urobilinogen, UA: 0.2 E.U./dL
pH, UA: 5.5 (ref 5.0–8.0)

## 2019-08-31 LAB — POCT URINE PREGNANCY: Preg Test, Ur: NEGATIVE

## 2019-08-31 MED ORDER — METRONIDAZOLE 500 MG PO TABS: 500.0000 mg | ORAL_TABLET | Freq: Two times a day (BID) | ORAL | 0 refills | Status: DC

## 2019-08-31 NOTE — ED Provider Notes (Signed)
[facility]    CSN: [id number] Arrival date & time: 08/31/19  1716      History   Chief Complaint Chief Complaint  Patient presents with   Urinary Tract Infection    HPI Lauren [name] is a 28 y.o. female.   28 year old female comes in for 5 day history of urinary symptoms, left flank pain. She has also had vaginal discharge for the past month with worsening since current symptom onset. She had had urinary frequency, urgency.  Denies fever, chills, body aches.  Denies abdominal pain, nausea, vomiting, diarrhea.  Denies hematuria.  Denies vaginal itching, bleeding.  Sexually active with one female partner, no condom use.  LMP 08/14/2019.  Patient has history of gestational diabetes, states resolved after giving birth.  Denies polydipsia, polyphagia. Denies vision changes.      Past Medical History:  Diagnosis Date   Allergy    allergic rhinitis   Asthma    Gestational diabetes    with first pregnancy   Obesity    Pyelonephritis 06/2016    Patient Active Problem List   Diagnosis Date Noted   Postpartum state 07/10/2016   Acute pyelonephritis 07/10/2016   SIRS (systemic inflammatory response syndrome) (HCC) 07/10/2016   Acute hypokalemia 07/10/2016   Acute hyperglycemia 07/10/2016   Anemia 07/10/2016   Indication for care or intervention in labor or delivery 06/07/2016   IUGR (intrauterine growth restriction) 09/16/2013   Rh negative state in antepartum period 07/31/2013   BMI 40.0-44.9, adult (HCC) 07/31/2013   Allergic rhinitis 01/29/2013    Past Surgical History:  Procedure Laterality Date   NO PAST SURGERIES     WISDOM TOOTH EXTRACTION      OB History    Gravida  2   Para  2   Term  2   Preterm      AB      Living  2     SAB      TAB      Ectopic      Multiple  0   Live Births  2            Home Medications    Prior to Admission medications   Medication Sig Start Date End Date Taking?  Authorizing Provider  metroNIDAZOLE (FLAGYL) 500 MG tablet Take 1 tablet (500 mg total) by mouth 2 (two) times daily. 08/31/19   Ayse Fisher, PA-C    Family History Family History  Problem Relation Age of Onset   Hypertension Mother    Diabetes Father    Cancer Other    Asthma Other     Social History Social History   Tobacco Use   Smoking status: Never Smoker   Smokeless tobacco: Never Used  Substance Use Topics   Alcohol use: No   Drug use: No     Allergies   Bee venom, Latex, and Other   Review of Systems Review of Systems  Reason unable to perform ROS: See HPI as above.     Physical Exam Triage Vital Signs ED Triage Vitals  Enc Vitals Group     BP 08/31/19 1736 (!) 144/92     Pulse Rate 08/31/19 1736 83     Resp 08/31/19 1736 18     Temp 08/31/19 1736 99.9 F (37.7 C)     Temp Source 08/31/19 1736 Oral     SpO2 08/31/19 1736 98 %     Weight --      Height --  Head Circumference --      Peak Flow --      Pain Score 08/31/19 1737 3     Pain Loc --      Pain Edu? --      Excl. in [place]? --    No data found.  Updated Vital Signs BP (!) 144/92 (BP Location: Left Arm)    Pulse 83    Temp 99.9 F (37.7 C) (Oral)    Resp 18    LMP 08/14/2019    SpO2 98%   Physical Exam Constitutional:      General: She is not in acute distress.    Appearance: She is well-developed. She is not ill-appearing, toxic-appearing or diaphoretic.  HENT:     Head: Normocephalic and atraumatic.  Eyes:     Conjunctiva/sclera: Conjunctivae normal.     Pupils: Pupils are equal, round, and reactive to light.  Cardiovascular:     Rate and Rhythm: Normal rate and regular rhythm.     Heart sounds: Normal heart sounds. No murmur. No friction rub. No gallop.   Pulmonary:     Effort: Pulmonary effort is normal.     Breath sounds: Normal breath sounds. No wheezing or rales.  Abdominal:     General: Bowel sounds are normal.     Palpations: Abdomen is soft.     Tenderness:  There is no abdominal tenderness. There is no right CVA tenderness, left CVA tenderness, guarding or rebound.  Skin:    General: Skin is warm and dry.  Neurological:     Mental Status: She is alert and oriented to person, place, and time.  Psychiatric:        Behavior: Behavior normal.        Judgment: Judgment normal.      UC Treatments / Results  Labs (all labs ordered are listed, but only abnormal results are displayed) Labs Reviewed  POCT URINALYSIS DIP (MANUAL ENTRY) - Abnormal; Notable for the following components:      Result Value   Spec Grav, UA >=1.030 (*)    All other components within normal limits  POCT URINE PREGNANCY  CERVICOVAGINAL ANCILLARY ONLY    EKG   Radiology No results found.  Procedures Procedures (including critical care time)  Medications Ordered in UC Medications - No data to display  Initial Impression / Assessment and Plan / UC Course  I have reviewed the triage vital signs and the nursing notes.  Pertinent labs & imaging results that were available during my care of the patient were reviewed by me and considered in my medical decision making (see chart for details).    Urine negative for infection, though does show increase in specific gravity.  Discussed with patient to avoid caffeine use, and to push fluids.  Patient to avoid fluid intake 2 to 3 hours prior to sleeping.  We will also send for cytology.  We will treat empirically for BV, start Flagyl as directed.  Return precautions given.  Patient expresses understanding and agrees to plan.  Final Clinical Impressions(s) / UC Diagnoses   Final diagnoses:  Vaginal discharge  Urinary frequency   ED Prescriptions    Medication Sig Dispense Auth. Provider   metroNIDAZOLE (FLAGYL) 500 MG tablet Take 1 tablet (500 mg total) by mouth 2 (two) times daily. 14 tablet Ok Edwards, PA-C     PDMP not reviewed this encounter.   Ok Edwards, PA-C 08/31/19 1805

## 2019-08-31 NOTE — Discharge Instructions (Signed)
As discussed, urine negative for infection, also negative for pregnancy.  Does show dehydration, make sure to drink more fluids.  Refrain from sodas, tea, Coffee, as this can cause you to urinate more.  As discussed, if continues to have symptoms, please follow-up with PCP for further evaluation needed.  You were treated empirically for BV, start flagyl as directed. Cytology sent, you will be contacted with any positive results that requires further treatment. Refrain from sexual activity and alcohol use for the next 7 days. Female: Monitor for any worsening of symptoms, fever, abdominal pain, nausea, vomiting, to follow up for reevaluation.

## 2019-08-31 NOTE — ED Triage Notes (Signed)
Pt c/o urinary frequency, urgencies, and lt flank pain x5 days

## 2019-09-03 LAB — CERVICOVAGINAL ANCILLARY ONLY
Bacterial vaginitis: NEGATIVE
Candida vaginitis: NEGATIVE
Chlamydia: NEGATIVE
Neisseria Gonorrhea: NEGATIVE
Trichomonas: NEGATIVE

## 2019-09-25 ENCOUNTER — Other Ambulatory Visit: Payer: Self-pay | Admitting: Cardiology

## 2019-09-25 DIAGNOSIS — Z20822 Contact with and (suspected) exposure to covid-19: Secondary | ICD-10-CM

## 2019-09-26 LAB — NOVEL CORONAVIRUS, NAA: SARS-CoV-2, NAA: DETECTED — AB

## 2019-09-30 ENCOUNTER — Other Ambulatory Visit: Payer: Self-pay

## 2019-09-30 ENCOUNTER — Emergency Department (HOSPITAL_COMMUNITY)
Admission: EM | Admit: 2019-09-30 | Discharge: 2019-10-01 | Disposition: A | Discharge status: Home / Self Care | Payer: Medicaid Other | Attending: Emergency Medicine | Admitting: Emergency Medicine

## 2019-09-30 ENCOUNTER — Emergency Department (HOSPITAL_COMMUNITY): Payer: Medicaid Other

## 2019-09-30 DIAGNOSIS — U071 COVID-19: Secondary | ICD-10-CM | POA: Insufficient documentation

## 2019-09-30 DIAGNOSIS — R238 Other skin changes: Secondary | ICD-10-CM | POA: Insufficient documentation

## 2019-09-30 MED ORDER — METHYLPREDNISOLONE SODIUM SUCC 125 MG IJ SOLR
125.0000 mg | Freq: Once | INTRAMUSCULAR | Status: AC
  Administered 2019-09-30: 125 mg via INTRAVENOUS
  Filled 2019-09-30: qty 2

## 2019-09-30 MED ORDER — ALBUTEROL SULFATE HFA 108 (90 BASE) MCG/ACT IN AERS
6.0000 | INHALATION_SPRAY | Freq: Once | RESPIRATORY_TRACT | Status: AC
  Administered 2019-09-30: 6 via RESPIRATORY_TRACT
  Filled 2019-09-30: qty 6.7

## 2019-09-30 MED ORDER — ALBUTEROL SULFATE HFA 108 (90 BASE) MCG/ACT IN AERS: 2.0000 | INHALATION_SPRAY | Freq: Four times a day (QID) | RESPIRATORY_TRACT | 0 refills | Status: DC | PRN

## 2019-09-30 MED ORDER — SODIUM CHLORIDE 0.9 % IV BOLUS
1000.0000 mL | Freq: Once | INTRAVENOUS | Status: AC
  Administered 2019-09-30: 1000 mL via INTRAVENOUS

## 2019-09-30 MED ORDER — PREDNISONE 10 MG PO TABS: 20.0000 mg | ORAL_TABLET | Freq: Every day | ORAL | 0 refills | Status: DC

## 2019-09-30 MED ORDER — MAGNESIUM SULFATE 2 GM/50ML IV SOLN
2.0000 g | Freq: Once | INTRAVENOUS | Status: AC
  Administered 2019-09-30: 2 g via INTRAVENOUS
  Filled 2019-09-30: qty 50

## 2019-09-30 MED ORDER — ACETAMINOPHEN 325 MG PO TABS
650.0000 mg | ORAL_TABLET | Freq: Once | ORAL | Status: AC
  Administered 2019-09-30: 650 mg via ORAL
  Filled 2019-09-30: qty 2

## 2019-09-30 NOTE — Discharge Instructions (Signed)
Take Tylenol for fever.  Drink plenty of fluids.  Follow-up with your family doctor next week.

## 2019-09-30 NOTE — ED Triage Notes (Signed)
Patient c/o SOB x1 week. Tested positive for COVID-19 on Friday. States she is also having fever and chills; denies taking any medications.

## 2019-09-30 NOTE — ED Provider Notes (Signed)
[facility]-EMERGENCY DEPT Provider Note   CSN: [id number] Arrival date & time: 09/30/19  1637     History   Chief Complaint Chief Complaint  Patient presents with  . Shortness of Breath  . covid positive    HPI Lauren [name] is a 28 y.o. female.     Patient complains of shortness of breath.  Patient is Covid positive and has asthma.  The history is provided by the patient. No language interpreter was used.  Shortness of Breath Severity:  Moderate Onset quality:  Sudden Timing:  Constant Progression:  Worsening Chronicity:  New Context: activity   Relieved by:  Nothing Worsened by:  Nothing Ineffective treatments:  None tried Associated symptoms: no abdominal pain, no chest pain, no cough, no headaches and no rash     Past Medical History:  Diagnosis Date  . Allergy    allergic rhinitis  . Asthma   . Gestational diabetes    with first pregnancy  . Obesity   . Pyelonephritis 06/2016    Patient Active Problem List   Diagnosis Date Noted  . Postpartum state 07/10/2016  . Acute pyelonephritis 07/10/2016  . SIRS (systemic inflammatory response syndrome) (HCC) 07/10/2016  . Acute hypokalemia 07/10/2016  . Acute hyperglycemia 07/10/2016  . Anemia 07/10/2016  . Indication for care or intervention in labor or delivery 06/07/2016  . IUGR (intrauterine growth restriction) 09/16/2013  . Rh negative state in antepartum period 07/31/2013  . BMI 40.0-44.9, adult (HCC) 07/31/2013  . Allergic rhinitis 01/29/2013    Past Surgical History:  Procedure Laterality Date  . NO PAST SURGERIES    . WISDOM TOOTH EXTRACTION       OB History    Gravida  2   Para  2   Term  2   Preterm      AB      Living  2     SAB      TAB      Ectopic      Multiple  0   Live Births  2            Home Medications    Prior to Admission medications   Medication Sig Start Date End Date Taking? Authorizing Provider  metroNIDAZOLE (FLAGYL)  500 MG tablet Take 1 tablet (500 mg total) by mouth 2 (two) times daily. Patient not taking: Reported on 09/30/2019 08/31/19   Lurline Idol    Family History Family History  Problem Relation Age of Onset  . Hypertension Mother   . Diabetes Father   . Cancer Other   . Asthma Other     Social History Social History   Tobacco Use  . Smoking status: Never Smoker  . Smokeless tobacco: Never Used  Substance Use Topics  . Alcohol use: No  . Drug use: No     Allergies   Bee venom, Latex, and Other   Review of Systems Review of Systems  Constitutional: Negative for appetite change and fatigue.  HENT: Negative for congestion, ear discharge and sinus pressure.   Eyes: Negative for discharge.  Respiratory: Positive for shortness of breath. Negative for cough.   Cardiovascular: Negative for chest pain.  Gastrointestinal: Negative for abdominal pain and diarrhea.  Genitourinary: Negative for frequency and hematuria.  Musculoskeletal: Negative for back pain.  Skin: Negative for rash.  Neurological: Negative for seizures and headaches.  Psychiatric/Behavioral: Negative for hallucinations.     Physical Exam Updated Vital Signs BP 130/88 (BP  Location: Right Arm)   Pulse (!) 136   Temp (!) 101.3 F (38.5 C) (Oral)   Resp 20   Ht 5\' 6"  (1.676 m)   Wt 127 kg   LMP 09/30/2019   SpO2 93%   BMI 45.19 kg/m   Physical Exam Vitals signs and nursing note reviewed.  Constitutional:      Appearance: She is well-developed.  HENT:     Head: Normocephalic.     Nose: Nose normal.  Eyes:     General: No scleral icterus.    Conjunctiva/sclera: Conjunctivae normal.  Neck:     Musculoskeletal: Neck supple.     Thyroid: No thyromegaly.  Cardiovascular:     Rate and Rhythm: Normal rate and regular rhythm.     Heart sounds: No murmur. No friction rub. No gallop.   Pulmonary:     Breath sounds: No stridor.     Comments: Tachypneic Chest:     Chest wall: No tenderness.   Abdominal:     General: There is no distension.     Tenderness: There is no abdominal tenderness. There is no rebound.  Musculoskeletal: Normal range of motion.  Lymphadenopathy:     Cervical: No cervical adenopathy.  Skin:    Findings: No erythema or rash.  Neurological:     Mental Status: She is alert and oriented to person, place, and time.     Motor: No abnormal muscle tone.     Coordination: Coordination normal.  Psychiatric:        Behavior: Behavior normal.      ED Treatments / Results  Labs (all labs ordered are listed, but only abnormal results are displayed) Labs Reviewed  CBC WITH DIFFERENTIAL/PLATELET  COMPREHENSIVE METABOLIC PANEL    EKG None  Radiology Dg Chest 2 View  Result Date: 09/30/2019 CLINICAL DATA:  COVID-19, shortness of breath EXAM: CHEST - 2 VIEW COMPARISON:  07/10/2016 FINDINGS: The heart size and mediastinal contours are within normal limits. Heterogeneous airspace opacity of the bilateral lung bases and small bilateral pleural effusions. The visualized skeletal structures are unremarkable. IMPRESSION: Bibasilar airspace opacities and small bilateral pleural effusions, generally consistent with reported diagnosis of COVID-19 pneumonia. Electronically Signed   By: Lauralyn PrimesAlex  Bibbey M.D.   On: 09/30/2019 17:11    Procedures Procedures (including critical care time)  Medications Ordered in ED Medications  sodium chloride 0.9 % bolus 1,000 mL (has no administration in time range)  methylPREDNISolone sodium succinate (SOLU-MEDROL) 125 mg/2 mL injection 125 mg (has no administration in time range)  albuterol (VENTOLIN HFA) 108 (90 Base) MCG/ACT inhaler 6 puff (has no administration in time range)  magnesium sulfate IVPB 2 g 50 mL (has no administration in time range)  acetaminophen (TYLENOL) tablet 650 mg (has no administration in time range)  Lauren [name] was evaluated in Emergency Department on 09/30/2019 for the symptoms described in the history of  present illness. She was evaluated in the context of the global COVID-19 pandemic, which necessitated consideration that the patient might be at risk for infection with the SARS-CoV-2 virus that causes COVID-19. Institutional protocols and algorithms that pertain to the evaluation of patients at risk for COVID-19 are in a state of rapid change based on information released by regulatory bodies including the CDC and federal and state organizations. These policies and algorithms were followed during the patient's care in the ED.    Initial Impression / Assessment and Plan / ED Course  I have reviewed the triage vital signs and  the nursing notes.  Pertinent labs & imaging results that were available during my care of the patient were reviewed by me and considered in my medical decision making (see chart for details).    Patient with Covid and has for exacerbation.  She is sent home on steroids and albuterol will follow-up      Final Clinical Impressions(s) / ED Diagnoses   Final diagnoses:  None    ED Discharge Orders    None       Milton Ferguson, MD 10/02/19 1010

## 2019-09-30 NOTE — ED Notes (Signed)
Pt provided with water

## 2019-10-01 LAB — COMPREHENSIVE METABOLIC PANEL
ALT: 13 U/L (ref 0–44)
AST: 14 U/L — ABNORMAL LOW (ref 15–41)
Albumin: 4 g/dL (ref 3.5–5.0)
Alkaline Phosphatase: 27 U/L — ABNORMAL LOW (ref 38–126)
Anion gap: 11 (ref 5–15)
BUN: 12 mg/dL (ref 6–20)
CO2: 23 mmol/L (ref 22–32)
Calcium: 8.7 mg/dL — ABNORMAL LOW (ref 8.9–10.3)
Chloride: 101 mmol/L (ref 98–111)
Creatinine, Ser: 0.87 mg/dL (ref 0.44–1.00)
GFR calc Af Amer: >60 mL/min (ref >60)
GFR calc non Af Amer: >60 mL/min (ref >60)
Glucose, Bld: 99 mg/dL (ref 70–99)
Potassium: 4.1 mmol/L (ref 3.5–5.1)
Sodium: 135 mmol/L (ref 135–145)
Total Bilirubin: 1.1 mg/dL (ref 0.3–1.2)
Total Protein: 7.8 g/dL (ref 6.5–8.1)

## 2019-10-01 LAB — CBC WITH DIFFERENTIAL/PLATELET
Abs Immature Granulocytes: 0.03 10*3/uL (ref 0.00–0.07)
Basophils Absolute: 0 10*3/uL (ref 0.0–0.1)
Basophils Relative: 1 %
Eosinophils Absolute: 0 10*3/uL (ref 0.0–0.5)
Eosinophils Relative: 0 %
HCT: 39.9 % (ref 36.0–46.0)
Hemoglobin: 12.6 g/dL (ref 12.0–15.0)
Immature Granulocytes: 1 %
Lymphocytes Relative: 55 %
Lymphs Abs: 2.1 10*3/uL (ref 0.7–4.0)
MCH: 27 pg (ref 26.0–34.0)
MCHC: 31.6 g/dL (ref 30.0–36.0)
MCV: 85.6 fL (ref 80.0–100.0)
Monocytes Absolute: 0.4 10*3/uL (ref 0.1–1.0)
Monocytes Relative: 10 %
Neutro Abs: 1.3 10*3/uL — ABNORMAL LOW (ref 1.7–7.7)
Neutrophils Relative %: 33 %
Platelets: 177 10*3/uL (ref 150–400)
RBC: 4.66 MIL/uL (ref 3.87–5.11)
RDW: 13.4 % (ref 11.5–15.5)
WBC: 3.9 10*3/uL — ABNORMAL LOW (ref 4.0–10.5)
nRBC: 0 % (ref 0.0–0.2)

## 2020-01-05 IMAGING — CT CT ABD-PELV W/ CM
2 of 4 series · 15 of 46 positions shown, 17 images · IV contrast (ISOVUE)
Comparison: Pelvic ultrasound dated 07/10/2016

CLINICAL DATA: 28-year-old female with abdominal pain. Concern for
acute appendicitis.

EXAM:
CT ABDOMEN AND PELVIS WITH CONTRAST
TECHNIQUE: Multidetector CT imaging of the abdomen and pelvis was performed
using the standard protocol following bolus administration of
intravenous contrast.
CONTRAST:  100mL SP84F7-ZHH IOPAMIDOL (SP84F7-ZHH) INJECTION 61%

[Series 2: axial st · axial · 0.68mm/px · z∈[+1048,+1488]mm · 12 of 98 slices shown, 14 images]
[im 5/98  soft-tissue]
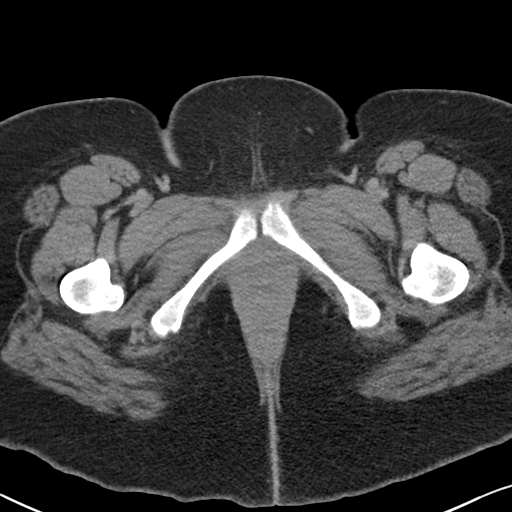
[im 5/98  bone]
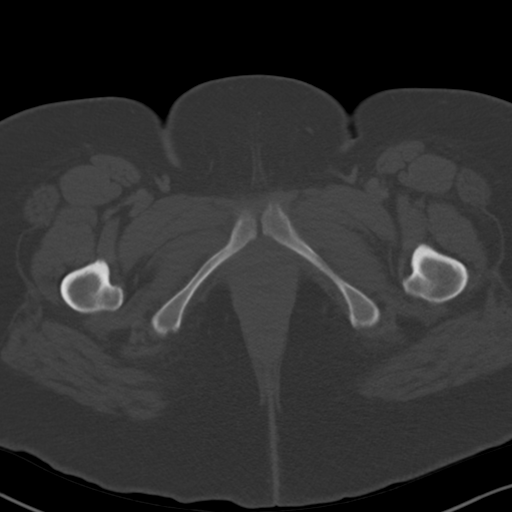
[im 15/98  soft-tissue]
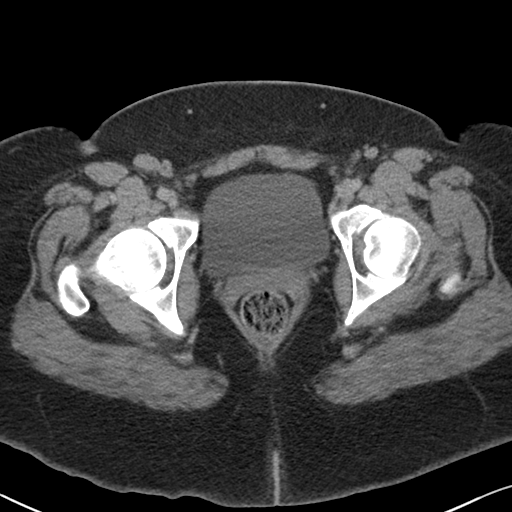
[im 20/98  soft-tissue]
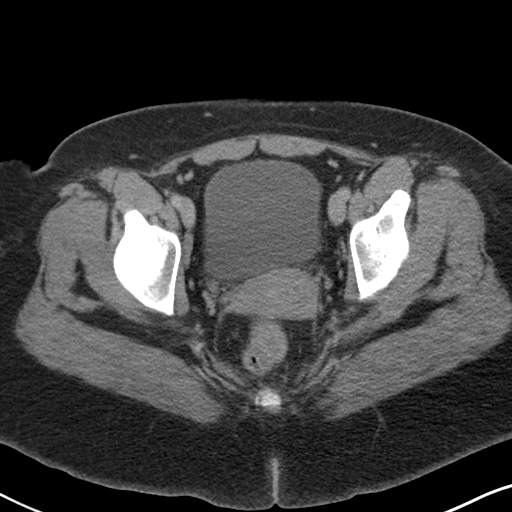
[im 30/98  soft-tissue]
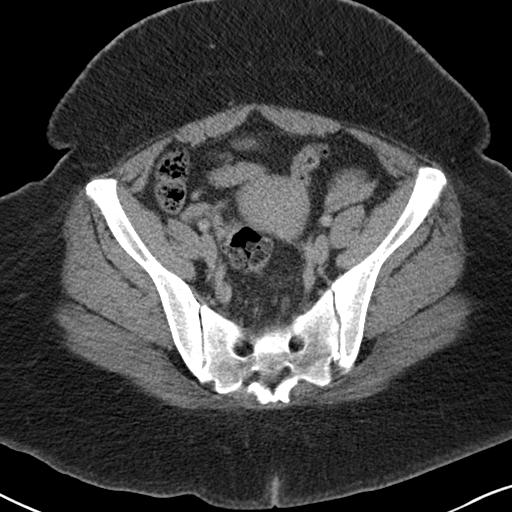
[im 39/98  soft-tissue]
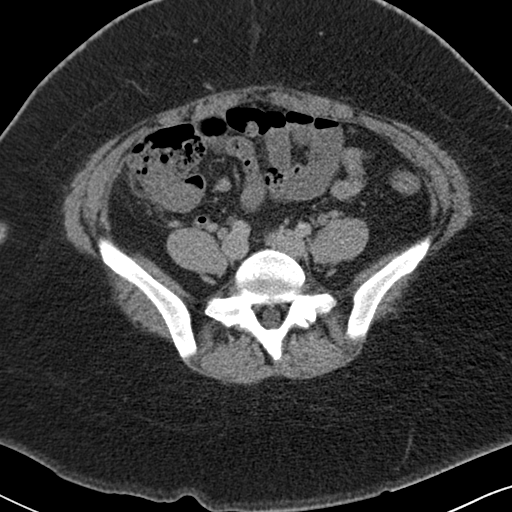
[im 44/98  soft-tissue]
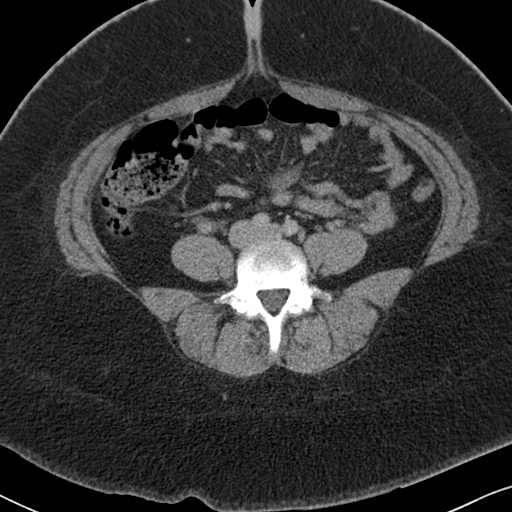
[im 54/98  soft-tissue]
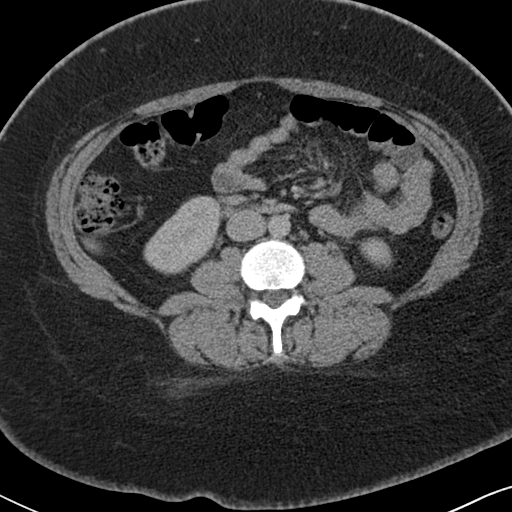
[im 59/98  soft-tissue]
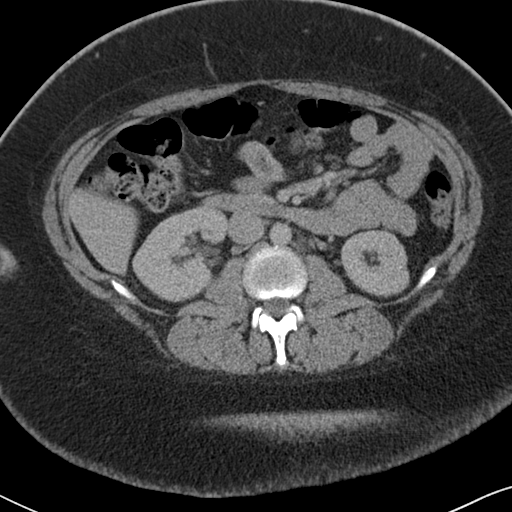
[im 68/98  soft-tissue]
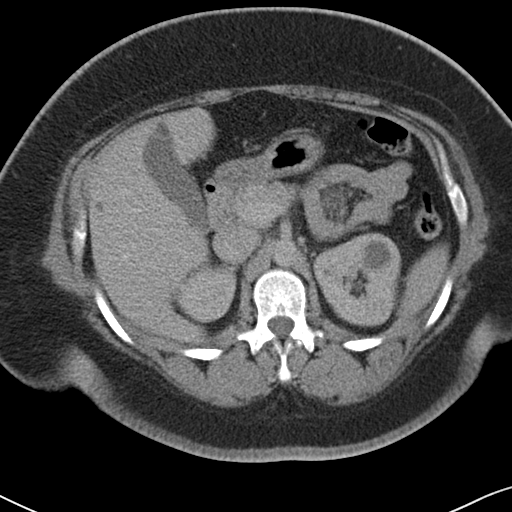
[im 68/98  bone]
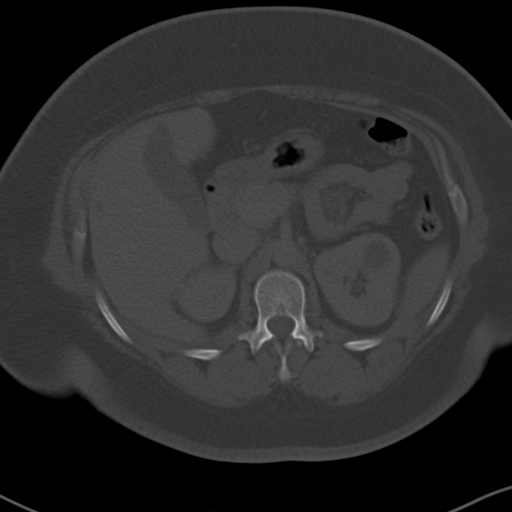
[im 78/98  soft-tissue]
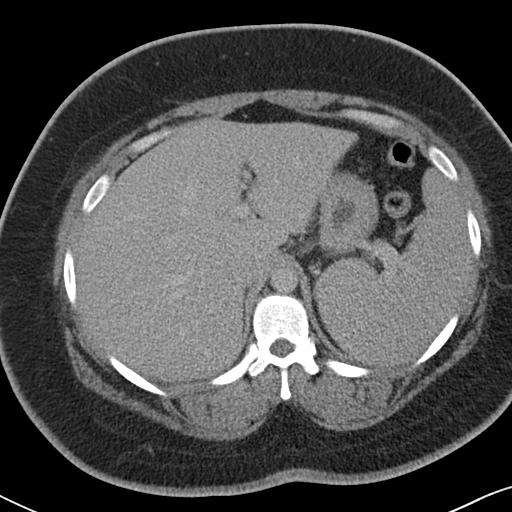
[im 83/98  soft-tissue]
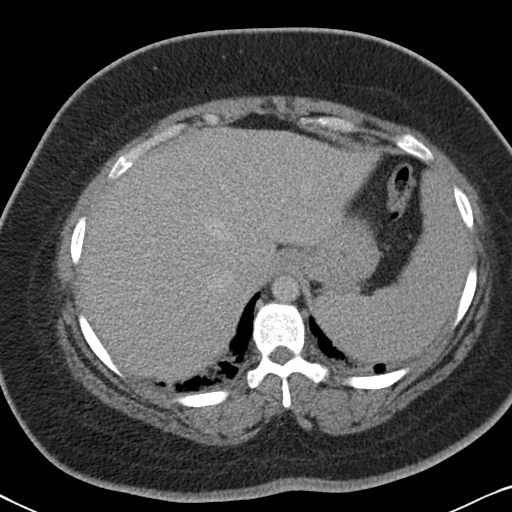
[im 93/98  soft-tissue]
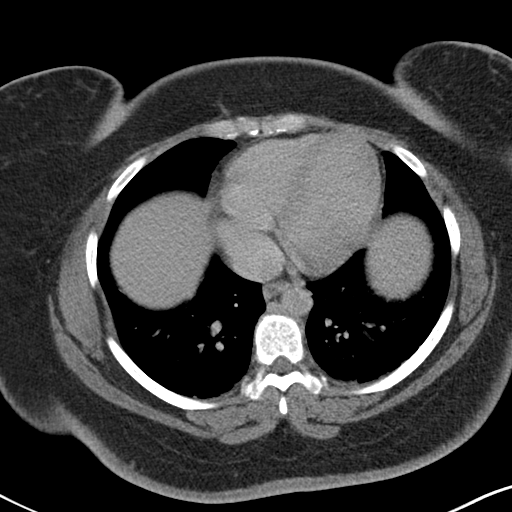

[Series 4: coronal st · coronal · 0.63mm/px · 3 of 154 slices shown]
[im 52/154  soft-tissue]
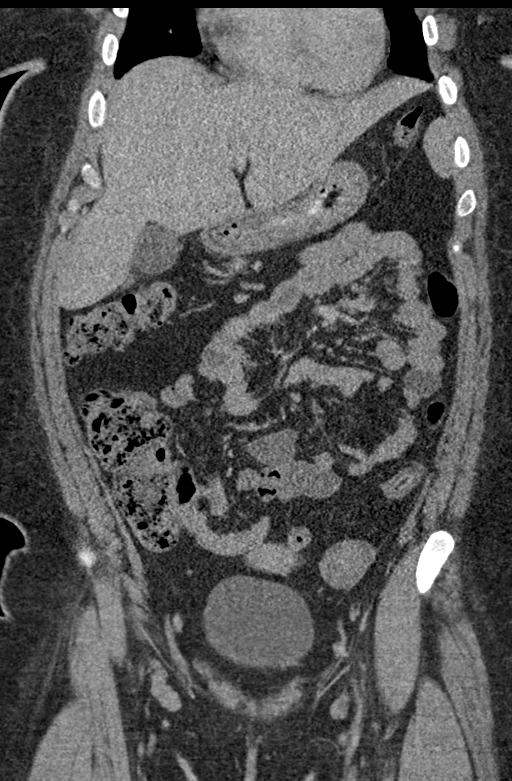
[im 69/154  soft-tissue]
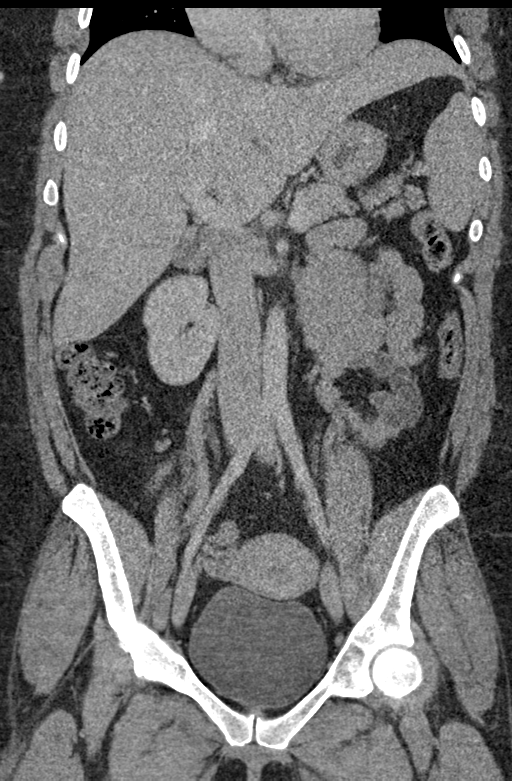
[im 86/154  soft-tissue]
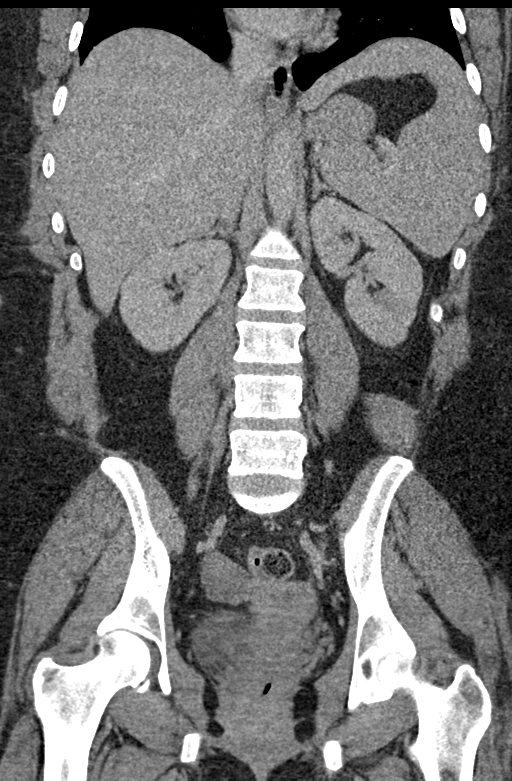

[15 of 46 positions shown; findings below may reference images not displayed]

FINDINGS: Lower chest: Minimal bibasilar dependent atelectasis.

No intra-abdominal free air or free fluid.

Hepatobiliary: Subcentimeter right hepatic hypodense lesion is too
small to characterize. The liver is otherwise unremarkable. No
intrahepatic biliary ductal dilatation. The gallbladder contains a
fold otherwise unremarkable.

Pancreas: Unremarkable. No pancreatic ductal dilatation or
surrounding inflammatory changes.

Spleen: Normal in size without focal abnormality.

Adrenals/Urinary Tract: The adrenal glands are unremarkable. There
is a 1.5 cm left renal hypodense lesion, suboptimally characterized
but demonstrates fluid attenuation most consistent with a cyst.
There is no hydronephrosis or nephrolithiasis on either side. The
visualized ureters and urinary bladder appear unremarkable.

Stomach/Bowel: There is no bowel obstruction or active inflammation.
Normal appendix.

Vascular/Lymphatic: The abdominal aorta and IVC appear unremarkable.
No portal venous gas. There is no adenopathy.

Reproductive: The uterus and ovaries are grossly unremarkable. No
pelvic mass.

Other: None

Musculoskeletal: No acute or significant osseous findings.
IMPRESSION: No acute intra-abdominal or pelvic pathology. No bowel obstruction
or active inflammation. Normal appendix.

## 2020-02-10 ENCOUNTER — Ambulatory Visit
Admission: EM | Admit: 2020-02-10 | Discharge: 2020-02-10 | Disposition: A | Discharge status: Home / Self Care | Payer: 59 | Attending: Physician Assistant | Admitting: Physician Assistant

## 2020-02-10 DIAGNOSIS — R0981 Nasal congestion: Secondary | ICD-10-CM | POA: Diagnosis not present

## 2020-02-10 DIAGNOSIS — R05 Cough: Secondary | ICD-10-CM

## 2020-02-10 DIAGNOSIS — R059 Cough, unspecified: Secondary | ICD-10-CM

## 2020-02-10 DIAGNOSIS — R432 Parageusia: Secondary | ICD-10-CM

## 2020-02-10 MED ORDER — ALBUTEROL SULFATE HFA 108 (90 BASE) MCG/ACT IN AERS: 2.0000 | INHALATION_SPRAY | Freq: Four times a day (QID) | RESPIRATORY_TRACT | 0 refills | Status: DC | PRN

## 2020-02-10 MED ORDER — AZELASTINE HCL 0.1 % NA SOLN: 2.0000 | Freq: Two times a day (BID) | NASAL | 0 refills | Status: DC

## 2020-02-10 MED ORDER — FLUTICASONE PROPIONATE 50 MCG/ACT NA SUSP: 2.0000 | Freq: Every day | NASAL | 0 refills | Status: DC

## 2020-02-10 NOTE — Discharge Instructions (Addendum)
COVID PCR testing ordered. I would like you to quarantine until testing results. Start flonase/azelastine as directed. Albuterol as needed. Tylenol/motrin for pain and fever. Keep hydrated, urine should be clear to pale yellow in color. If experiencing shortness of breath, trouble breathing, go to the emergency department for further evaluation needed.

## 2020-02-10 NOTE — ED Provider Notes (Signed)
[facility]    CSN: [id number] Arrival date & time: 02/10/20  1531      History   Chief Complaint Chief Complaint  Patient presents with  . Nasal Congestion    HPI Lauren [name] is a 29 y.o. female.   29 year old female with history asthma comes in for 1 week history of URI symptoms. Nasal congestion, sore throat, cough, loss of taste/smell, watery/itching eyes. Denies fever, chills, body aches. Denies abdominal pain, nausea, vomiting, diarrhea. Shortness of breath at baseline, no changes.   COVID positive 4 months ago. Symptoms completely resolved prior to current symptom onset      Past Medical History:  Diagnosis Date  . Allergy    allergic rhinitis  . Asthma   . Gestational diabetes    with first pregnancy  . Obesity   . Pyelonephritis 06/2016    Patient Active Problem List   Diagnosis Date Noted  . Postpartum state 07/10/2016  . Acute pyelonephritis 07/10/2016  . SIRS (systemic inflammatory response syndrome) (HCC) 07/10/2016  . Acute hypokalemia 07/10/2016  . Acute hyperglycemia 07/10/2016  . Anemia 07/10/2016  . Indication for care or intervention in labor or delivery 06/07/2016  . IUGR (intrauterine growth restriction) 09/16/2013  . Rh negative state in antepartum period 07/31/2013  . BMI 40.0-44.9, adult (HCC) 07/31/2013  . Allergic rhinitis 01/29/2013    Past Surgical History:  Procedure Laterality Date  . NO PAST SURGERIES    . WISDOM TOOTH EXTRACTION      OB History    Gravida  2   Para  2   Term  2   Preterm      AB      Living  2     SAB      TAB      Ectopic      Multiple  0   Live Births  2            Home Medications    Prior to Admission medications   Medication Sig Start Date End Date Taking? Authorizing Provider  albuterol (VENTOLIN HFA) 108 (90 Base) MCG/ACT inhaler Inhale 2 puffs into the lungs every 6 (six) hours as needed for wheezing or shortness of breath. 02/10/20   Cathie Hoops, Anett Ranker V, PA-C    azelastine (ASTELIN) 0.1 % nasal spray Place 2 sprays into both nostrils 2 (two) times daily. 02/10/20   Cathie Hoops, Dyllin Gulley V, PA-C  fluticasone (FLONASE) 50 MCG/ACT nasal spray Place 2 sprays into both nostrils daily. 02/10/20   Latiana Fisher, PA-C    Family History Family History  Problem Relation Age of Onset  . Hypertension Mother   . Diabetes Father   . Cancer Other   . Asthma Other     Social History Social History   Tobacco Use  . Smoking status: Never Smoker  . Smokeless tobacco: Never Used  Substance Use Topics  . Alcohol use: No  . Drug use: No     Allergies   Bee venom, Latex, and Other   Review of Systems Review of Systems  Reason unable to perform ROS: See HPI as above.     Physical Exam Triage Vital Signs ED Triage Vitals [02/10/20 1622]  Enc Vitals Group     BP 127/80     Pulse Rate 79     Resp 16     Temp 98.3 F (36.8 C)     Temp Source Oral     SpO2 98 %  Weight      Height      Head Circumference      Peak Flow      Pain Score 7     Pain Loc      Pain Edu?      Excl. in [place]?    No data found.  Updated Vital Signs BP 127/80 (BP Location: Left Arm)   Pulse 79   Temp 98.3 F (36.8 C) (Oral)   Resp 16   LMP 02/03/2020   SpO2 98%   Breastfeeding No   Physical Exam Constitutional:      General: Lauren [name] is not in acute distress.    Appearance: Normal appearance. Lauren [name] is well-developed. Lauren [name] is not ill-appearing, toxic-appearing or diaphoretic.  HENT:     Head: Normocephalic and atraumatic.     Right Ear: Tympanic membrane, ear canal and external ear normal. Tympanic membrane is not erythematous or bulging.     Left Ear: Tympanic membrane, ear canal and external ear normal. Tympanic membrane is not erythematous or bulging.     Nose:     Right Sinus: No maxillary sinus tenderness or frontal sinus tenderness.     Left Sinus: No maxillary sinus tenderness or frontal sinus tenderness.     Mouth/Throat:     Mouth: Mucous membranes are moist.      Pharynx: Oropharynx is clear. Uvula midline.  Eyes:     Conjunctiva/sclera: Conjunctivae normal.     Pupils: Pupils are equal, round, and reactive to light.  Cardiovascular:     Rate and Rhythm: Normal rate and regular rhythm.  Pulmonary:     Effort: Pulmonary effort is normal. No accessory muscle usage, prolonged expiration, respiratory distress or retractions.     Breath sounds: No decreased air movement or transmitted upper airway sounds. No decreased breath sounds.     Comments: LCTAB Musculoskeletal:     Cervical back: Normal range of motion and neck supple.  Skin:    General: Skin is warm and dry.  Neurological:     Mental Status: Lauren [name] is alert and oriented to person, place, and time.     UC Treatments / Results  Labs (all labs ordered are listed, but only abnormal results are displayed) Labs Reviewed  NOVEL CORONAVIRUS, NAA    EKG   Radiology No results found.  Procedures Procedures (including critical care time)  Medications Ordered in UC Medications - No data to display  Initial Impression / Assessment and Plan / UC Course  I have reviewed the triage vital signs and the nursing notes.  Pertinent labs & imaging results that were available during my care of the patient were reviewed by me and considered in my medical decision making (see chart for details).    COVID PCR test ordered. Patient to quarantine until testing results return. No alarming signs on exam.  Patient speaking in full sentences without respiratory distress.  Symptomatic treatment discussed.  Push fluids.  Return precautions given.  Patient expresses understanding and agrees to plan.  Final Clinical Impressions(s) / UC Diagnoses   Final diagnoses:  Nasal congestion  Cough  Loss of taste    ED Prescriptions    Medication Sig Dispense Auth. Provider   fluticasone (FLONASE) 50 MCG/ACT nasal spray Place 2 sprays into both nostrils daily. 1 g Kindra Bickham V, PA-C   azelastine (ASTELIN) 0.1 %  nasal spray Place 2 sprays into both nostrils 2 (two) times daily. 30 mL Cally Nygard V, PA-C   albuterol (VENTOLIN HFA)  108 (90 Base) MCG/ACT inhaler Inhale 2 puffs into the lungs every 6 (six) hours as needed for wheezing or shortness of breath. 8 g Tashawna Fisher, PA-C     PDMP not reviewed this encounter.   Janayia Fisher, PA-C 02/10/20 1656

## 2020-02-10 NOTE — ED Triage Notes (Signed)
Pt c/o nasal congestion, sore throat, cough, and loss of taste and smell since last Tuesday. States treating with zyrtec with no relief.

## 2020-02-11 LAB — NOVEL CORONAVIRUS, NAA: SARS-CoV-2, NAA: NOT DETECTED

## 2020-02-11 LAB — SARS-COV-2, NAA 2 DAY TAT

## 2020-11-02 ENCOUNTER — Ambulatory Visit
Admission: EM | Admit: 2020-11-02 | Discharge: 2020-11-02 | Disposition: A | Discharge status: Home / Self Care | Payer: 59 | Attending: Emergency Medicine | Admitting: Emergency Medicine

## 2020-11-02 ENCOUNTER — Other Ambulatory Visit: Payer: Self-pay

## 2020-11-02 DIAGNOSIS — J069 Acute upper respiratory infection, unspecified: Secondary | ICD-10-CM

## 2020-11-02 DIAGNOSIS — Z20822 Contact with and (suspected) exposure to covid-19: Secondary | ICD-10-CM | POA: Diagnosis not present

## 2020-11-02 MED ORDER — AMOXICILLIN-POT CLAVULANATE 875-125 MG PO TABS: 1.0000 | ORAL_TABLET | Freq: Two times a day (BID) | ORAL | 0 refills | Status: AC

## 2020-11-02 NOTE — ED Triage Notes (Signed)
Pt c/o cough and congestion for a week, states last night developed headaches, body aches, and chills. Last ibuprofen an hour ago.

## 2020-11-02 NOTE — ED Provider Notes (Signed)
[facility]    CSN: [id number] Arrival date & time: 11/02/20  1531      History   Chief Complaint Chief Complaint  Patient presents with  . Cough    HPI Lauren [name] is a 30 y.o. female presenting today for evaluation of URI symptoms.  Reports that she has had nasal congestion, body aches and headache.  Reports that she has had sinus symptoms over the past week, but has had increased fevers headaches chills and body aches over the past couple of days.  Cough is mild in nature.  Symptoms mainly more sinus related.  HPI  Past Medical History:  Diagnosis Date  . Allergy    allergic rhinitis  . Asthma   . Gestational diabetes    with first pregnancy  . Obesity   . Pyelonephritis 06/2016    Patient Active Problem List   Diagnosis Date Noted  . Postpartum state 07/10/2016  . Acute pyelonephritis 07/10/2016  . SIRS (systemic inflammatory response syndrome) (HCC) 07/10/2016  . Acute hypokalemia 07/10/2016  . Acute hyperglycemia 07/10/2016  . Anemia 07/10/2016  . Indication for care or intervention in labor or delivery 06/07/2016  . IUGR (intrauterine growth restriction) 09/16/2013  . Rh negative state in antepartum period 07/31/2013  . BMI 40.0-44.9, adult (HCC) 07/31/2013  . Allergic rhinitis 01/29/2013    Past Surgical History:  Procedure Laterality Date  . NO PAST SURGERIES    . WISDOM TOOTH EXTRACTION      OB History    Gravida  2   Para  2   Term  2   Preterm      AB      Living  2     SAB      IAB      Ectopic      Multiple  0   Live Births  2            Home Medications    Prior to Admission medications   Medication Sig Start Date End Date Taking? Authorizing Provider  amoxicillin-clavulanate (AUGMENTIN) 875-125 MG tablet Take 1 tablet by mouth every 12 (twelve) hours for 7 days. 11/02/20 11/09/20 Yes Wieters, Junius Creamer, PA-C    Family History Family History  Problem Relation Age of Onset  . Hypertension Mother    . Diabetes Father   . Cancer Other   . Asthma Other     Social History Social History   Tobacco Use  . Smoking status: Never Smoker  . Smokeless tobacco: Never Used  Vaping Use  . Vaping Use: Never used  Substance Use Topics  . Alcohol use: No  . Drug use: No     Allergies   Bee venom, Latex, and Other   Review of Systems Review of Systems  Constitutional: Positive for chills, fatigue and fever. Negative for activity change and appetite change.  HENT: Positive for congestion, rhinorrhea and sinus pressure. Negative for ear pain, sore throat and trouble swallowing.   Eyes: Negative for discharge and redness.  Respiratory: Positive for cough. Negative for chest tightness and shortness of breath.   Cardiovascular: Negative for chest pain.  Gastrointestinal: Negative for abdominal pain, diarrhea, nausea and vomiting.  Musculoskeletal: Positive for myalgias.  Skin: Negative for rash.  Neurological: Positive for headaches. Negative for dizziness and light-headedness.     Physical Exam Triage Vital Signs ED Triage Vitals  Enc Vitals Group     BP      Pulse  Resp      Temp      Temp src      SpO2      Weight      Height      Head Circumference      Peak Flow      Pain Score      Pain Loc      Pain Edu?      Excl. in [place]?    No data found.  Updated Vital Signs BP 131/86 (BP Location: Left Arm)   Temp 100.1 F (37.8 C) (Oral)   Resp 18   LMP 10/25/2020   SpO2 97%   Visual Acuity Right Eye Distance:   Left Eye Distance:   Bilateral Distance:    Right Eye Near:   Left Eye Near:    Bilateral Near:     Physical Exam Vitals and nursing note reviewed.  Constitutional:      Appearance: She is well-developed and well-nourished.     Comments: No acute distress  HENT:     Head: Normocephalic and atraumatic.     Ears:     Comments: Bilateral ears without tenderness to palpation of external auricle, tragus and mastoid, EAC's without erythema or  swelling, TM's with good bony landmarks and cone of light. Non erythematous.     Nose: Nose normal.     Mouth/Throat:     Comments: Oral mucosa pink and moist, no tonsillar enlargement or exudate. Posterior pharynx patent and nonerythematous, no uvula deviation or swelling. Normal phonation. Eyes:     Conjunctiva/sclera: Conjunctivae normal.  Cardiovascular:     Rate and Rhythm: Normal rate.  Pulmonary:     Effort: Pulmonary effort is normal. No respiratory distress.     Comments: Breathing comfortably at rest, CTABL, no wheezing, rales or other adventitious sounds auscultated Abdominal:     General: There is no distension.  Musculoskeletal:        General: Normal range of motion.     Cervical back: Neck supple.  Skin:    General: Skin is warm and dry.  Neurological:     Mental Status: She is alert and oriented to person, place, and time.  Psychiatric:        Mood and Affect: Mood and affect normal.      UC Treatments / Results  Labs (all labs ordered are listed, but only abnormal results are displayed) Labs Reviewed  NOVEL CORONAVIRUS, NAA    EKG   Radiology No results found.  Procedures Procedures (including critical care time)  Medications Ordered in UC Medications - No data to display  Initial Impression / Assessment and Plan / UC Course  I have reviewed the triage vital signs and the nursing notes.  Pertinent labs & imaging results that were available during my care of the patient were reviewed by me and considered in my medical decision making (see chart for details).     COVID test pending, recommending continued symptomatic and supportive care while awaiting COVID test, if positive will continue treatment with close monitoring, if negative advised patient she may fill prescription for Augmentin to treat sinusitis given length of symptoms and recent worsening.  Discussed strict return precautions. Patient verbalized understanding and is agreeable with  plan.  Final Clinical Impressions(s) / UC Diagnoses   Final diagnoses:  Encounter for screening laboratory testing for COVID-19 virus  Viral URI with cough     Discharge Instructions     COVID test pending, monitor MyChart for results  Rest and fluids Tylenol and ibuprofen as needed for headaches body aches fevers May continue over-the-counter medicine for cough and congestion If test negative for COVID may fill prescription for Augmentin to take twice daily for the next week to treat sinus infection  Follow-up if any symptoms not improving or worsening    ED Prescriptions    Medication Sig Dispense Auth. Provider   amoxicillin-clavulanate (AUGMENTIN) 875-125 MG tablet Take 1 tablet by mouth every 12 (twelve) hours for 7 days. 14 tablet Wieters, Dawson C, PA-C     PDMP not reviewed this encounter.   Lew Dawes, [place] 11/02/20 1714

## 2020-11-02 NOTE — Discharge Instructions (Signed)
COVID test pending, monitor MyChart for results Rest and fluids Tylenol and ibuprofen as needed for headaches body aches fevers May continue over-the-counter medicine for cough and congestion If test negative for COVID may fill prescription for Augmentin to take twice daily for the next week to treat sinus infection  Follow-up if any symptoms not improving or worsening

## 2020-11-03 ENCOUNTER — Other Ambulatory Visit: Payer: 59

## 2020-11-03 LAB — SARS-COV-2, NAA 2 DAY TAT

## 2020-11-03 LAB — NOVEL CORONAVIRUS, NAA: SARS-CoV-2, NAA: DETECTED — AB

## 2020-11-04 ENCOUNTER — Telehealth: Payer: Self-pay | Admitting: Nurse Practitioner

## 2020-11-04 ENCOUNTER — Ambulatory Visit (HOSPITAL_COMMUNITY)
Admission: RE | Admit: 2020-11-04 | Discharge: 2020-11-04 | Disposition: A | Discharge status: Home / Self Care | Payer: 59 | Source: Ambulatory Visit | Attending: Pulmonary Disease | Admitting: Pulmonary Disease

## 2020-11-04 ENCOUNTER — Other Ambulatory Visit: Payer: Self-pay | Admitting: Nurse Practitioner

## 2020-11-04 DIAGNOSIS — U071 COVID-19: Secondary | ICD-10-CM

## 2020-11-04 MED ORDER — EPINEPHRINE 0.3 MG/0.3ML IJ SOAJ: 0.3000 mg | Freq: Once | INTRAMUSCULAR | Status: DC | PRN

## 2020-11-04 MED ORDER — SODIUM CHLORIDE 0.9 % IV SOLN: INTRAVENOUS | Status: DC | PRN

## 2020-11-04 MED ORDER — DIPHENHYDRAMINE HCL 50 MG/ML IJ SOLN: 50.0000 mg | Freq: Once | INTRAMUSCULAR | Status: DC | PRN

## 2020-11-04 MED ORDER — METHYLPREDNISOLONE SODIUM SUCC 125 MG IJ SOLR: 125.0000 mg | Freq: Once | INTRAMUSCULAR | Status: DC | PRN

## 2020-11-04 MED ORDER — ALBUTEROL SULFATE HFA 108 (90 BASE) MCG/ACT IN AERS: 2.0000 | INHALATION_SPRAY | Freq: Once | RESPIRATORY_TRACT | Status: DC | PRN

## 2020-11-04 MED ORDER — FAMOTIDINE IN NACL 20-0.9 MG/50ML-% IV SOLN: 20.0000 mg | Freq: Once | INTRAVENOUS | Status: DC | PRN

## 2020-11-04 MED ORDER — SOTROVIMAB 500 MG/8ML IV SOLN
500.0000 mg | Freq: Once | INTRAVENOUS | Status: AC
  Administered 2020-11-04: 500 mg via INTRAVENOUS

## 2020-11-04 NOTE — Discharge Instructions (Signed)

## 2020-11-04 NOTE — Telephone Encounter (Signed)
Called to discuss with patient about COVID-19 symptoms and the use of one of the available treatments for those with mild to moderate Covid symptoms and at a high risk of hospitalization.  Pt appears to qualify for outpatient treatment due to co-morbid conditions and/or a member of an at-risk group in accordance with the FDA Emergency Use Authorization.    Symptom onset: 10/31/2019 Vaccinated: no Booster? no Immunocompromised? no Qualifiers: SVI, BMI   Lauren [name] would like to receive treatment but needs to make arrangement for childcare. She will call back if she is able.   [name]

## 2020-11-04 NOTE — Progress Notes (Signed)
Patient reviewed Fact Sheet for Patients, Parents, and Caregivers for Emergency Use Authorization (EUA) of Sotrovimab for the Treatment of Coronavirus. Patient also reviewed and is agreeable to the estimated cost of treatment. Patient is agreeable to proceed.   

## 2020-11-04 NOTE — Progress Notes (Signed)
Diagnosis: COVID-19  Physician: Dr. Patrick Wright  Procedure: [facility] Infusion [facility] Med: Sotrovimab infusion - Provided patient with sotrovimab fact sheet for patients, parents, and caregivers prior to infusion.   Complications: No immediate complications noted  Discharge: Discharged home    

## 2020-11-04 NOTE — Progress Notes (Signed)
I connected by phone with Lauren [name] on 11/04/2020 at 2:34 PM to discuss the potential use of a new treatment for mild to moderate COVID-19 viral infection in non-hospitalized patients.  This patient is a 30 y.o. female that meets the FDA criteria for Emergency Use Authorization of COVID monoclonal antibody sotrovimab.  Has a (+) direct SARS-CoV-2 viral test result  Has mild or moderate COVID-19   Is NOT hospitalized due to COVID-19  Is within 10 days of symptom onset  Has at least one of the high risk factor(s) for progression to severe COVID-19 and/or hospitalization as defined in EUA.  Specific high risk criteria : BMI > 25 and Other high risk medical condition per CDC:  unvaccinated, SVI   I have spoken and communicated the following to the patient or parent/caregiver regarding COVID monoclonal antibody treatment:  1. FDA has authorized the emergency use for the treatment of mild to moderate COVID-19 in adults and pediatric patients with positive results of direct SARS-CoV-2 viral testing who are 89 years of age and older weighing at least 40 kg, and who are at high risk for progressing to severe COVID-19 and/or hospitalization.  2. The significant known and potential risks and benefits of COVID monoclonal antibody, and the extent to which such potential risks and benefits are unknown.  3. Information on available alternative treatments and the risks and benefits of those alternatives, including clinical trials.  4. Patients treated with COVID monoclonal antibody should continue to self-isolate and use infection control measures (e.g., wear mask, isolate, social distance, avoid sharing personal items, clean and disinfect "high touch" surfaces, and frequent handwashing) according to CDC guidelines.   5. The patient or parent/caregiver has the option to accept or refuse COVID monoclonal antibody treatment.  After reviewing this information with the patient, the patient has agreed  to receive one of the available covid 19 monoclonal antibodies and will be provided an appropriate fact sheet prior to infusion. [name], NP 11/04/2020 2:34 PM

## 2021-04-21 ENCOUNTER — Other Ambulatory Visit: Payer: Self-pay

## 2021-04-21 ENCOUNTER — Ambulatory Visit
Admission: RE | Admit: 2021-04-21 | Discharge: 2021-04-21 | Disposition: A | Discharge status: Home / Self Care | Payer: 59 | Source: Ambulatory Visit | Attending: Family Medicine | Admitting: Family Medicine

## 2021-04-21 VITALS — BP 157/93 | HR 82 | Temp 98.6°F | Resp 15

## 2021-04-21 DIAGNOSIS — H60393 Other infective otitis externa, bilateral: Secondary | ICD-10-CM

## 2021-04-21 MED ORDER — POLYMYXIN B-TRIMETHOPRIM 10000-0.1 UNIT/ML-% OP SOLN: 1.0000 [drp] | OPHTHALMIC | 0 refills | Status: AC

## 2021-04-21 NOTE — ED Triage Notes (Signed)
Patient c/o bilateral ear pain x 1 month.   Patient denies changes to hearing.   Patient endorses bilateral ear drainage w/ " light gray" color.   Patient endorses " it feels like pressure like I'm in a airplane".   Patient hasn't used any medications for symptoms.

## 2021-04-21 NOTE — ED Provider Notes (Signed)
[facility]   [id number] 04/21/21 Arrival Time: 1748  CC: EAR PAIN  SUBJECTIVE: History from: patient.  Lauren [name] is a 30 y.o. female who presents with of bilateral otalgia, fullness, drainage from both ears. Denies a precipitating event, such as swimming or wearing ear plugs. Patient states the pain is constant and achy in character. Patient has night taken OTC medications for this. Symptoms are made worse with lying down. Reports similar symptoms in the past. Denies fever, chills, fatigue, sinus pain, rhinorrhea, sore throat, SOB, wheezing, chest pain, nausea, changes in bowel or bladder habits.    ROS: As per HPI.  All other pertinent ROS negative.     Past Medical History:  Diagnosis Date   Allergy    allergic rhinitis   Asthma    Gestational diabetes    with first pregnancy   Obesity    Pyelonephritis 06/2016   Past Surgical History:  Procedure Laterality Date   NO PAST SURGERIES     WISDOM TOOTH EXTRACTION     Allergies  Allergen Reactions   Bee Venom Swelling   Other Hives and Other (See Comments)    Pt states that she is allergic to Zaxby's zax sauce.   No current facility-administered medications on file prior to encounter.   No current outpatient medications on file prior to encounter.   Social History   Socioeconomic History   Marital status: Married    Spouse name: Not on file   Number of children: Not on file   Years of education: Not on file   Highest education level: Not on file  Occupational History   Not on file  Tobacco Use   Smoking status: Never   Smokeless tobacco: Never  Vaping Use   Vaping Use: Never used  Substance and Sexual Activity   Alcohol use: No   Drug use: No   Sexual activity: Yes    Birth control/protection: None  Other Topics Concern   Not on file  Social History Narrative   Not on file   Social Determinants of Health   Financial Resource Strain: Not on file  Food Insecurity: Not on file   Transportation Needs: Not on file  Physical Activity: Not on file  Stress: Not on file  Social Connections: Not on file  Intimate Partner Violence: Not on file   Family History  Problem Relation Age of Onset   Hypertension Mother    Diabetes Father    Cancer Other    Asthma Other     OBJECTIVE:  Vitals:   04/21/21 1825  BP: (!) 157/93  Pulse: 82  Resp: 15  Temp: 98.6 F (37 C)  TempSrc: Oral  SpO2: 98%     General appearance: alert; appears fatigued HEENT: Ears: Bilateral EACs with erythema, swelling, drainage, tenderness, yellow drainage bilateral TMs pearly gray with visible cone of light, without erythema; Eyes: PERRL, EOMI grossly; Sinuses nontender to palpation; Nose: clear rhinorrhea; Throat: oropharynx mildly erythematous, tonsils 1+ without white tonsillar exudates, uvula midline Neck: supple without LAD Lungs: unlabored respirations, symmetrical air entry; cough: absent; no respiratory distress Heart: regular rate and rhythm.  Radial pulses 2+ symmetrical bilaterally Skin: warm and dry Psychological: alert and cooperative; normal mood and affect  Imaging: No results found.   ASSESSMENT & PLAN:  1. Other infective acute otitis externa of both ears     Meds ordered this encounter  Medications   trimethoprim-polymyxin b (POLYTRIM) ophthalmic solution    Sig: Place 1 drop into  both ears every 4 (four) hours for 5 days.    Dispense:  10 mL    Refill:  0    Order Specific Question:   Supervising Provider    Answer:   Merrilee Jansky [[id number]]    Rest and drink plenty of fluids Prescribed Polytrim drops Take medications as directed and to completion Continue to use OTC ibuprofen and/ or tylenol as needed for pain control Follow up with PCP if symptoms persists Return here or go to the ER if you have any new or worsening symptoms   Reviewed expectations re: course of current medical issues. Questions answered. Outlined signs and symptoms indicating  need for more acute intervention. Patient verbalized understanding. After Visit Summary given.          Moshe Cipro, NP 04/21/21 1901

## 2021-04-21 NOTE — Discharge Instructions (Addendum)
I have sent in drops for you to use one drop every 4 hours to each ear for 5 days  Follow up with this office or with primary care if symptoms are persisting.  Follow up in the ER for high fever, trouble swallowing, trouble breathing, other concerning symptoms.

## 2021-08-16 ENCOUNTER — Encounter: Payer: Self-pay | Admitting: Emergency Medicine

## 2021-08-16 ENCOUNTER — Ambulatory Visit
Admission: EM | Admit: 2021-08-16 | Discharge: 2021-08-16 | Disposition: A | Discharge status: Home / Self Care | Payer: 59 | Attending: Internal Medicine | Admitting: Internal Medicine

## 2021-08-16 ENCOUNTER — Other Ambulatory Visit: Payer: Self-pay

## 2021-08-16 DIAGNOSIS — N3001 Acute cystitis with hematuria: Secondary | ICD-10-CM | POA: Diagnosis present

## 2021-08-16 DIAGNOSIS — R300 Dysuria: Secondary | ICD-10-CM

## 2021-08-16 DIAGNOSIS — R350 Frequency of micturition: Secondary | ICD-10-CM | POA: Diagnosis present

## 2021-08-16 DIAGNOSIS — N898 Other specified noninflammatory disorders of vagina: Secondary | ICD-10-CM

## 2021-08-16 LAB — POCT URINALYSIS DIP (MANUAL ENTRY)
Bilirubin, UA: NEGATIVE
Glucose, UA: NEGATIVE mg/dL
Nitrite, UA: NEGATIVE
Protein Ur, POC: 100 mg/dL — AB
Spec Grav, UA: 1.025 (ref 1.010–1.025)
Urobilinogen, UA: 1 E.U./dL
pH, UA: 7 (ref 5.0–8.0)

## 2021-08-16 MED ORDER — NITROFURANTOIN MONOHYD MACRO 100 MG PO CAPS: 100.0000 mg | ORAL_CAPSULE | Freq: Two times a day (BID) | ORAL | 0 refills | Status: DC

## 2021-08-16 NOTE — ED Provider Notes (Signed)
[facility]    CSN: [id number] Arrival date & time: 08/16/21  1717      History   Chief Complaint Chief Complaint  Patient presents with   Urinary Urgency    HPI Lauren [name] is a 30 y.o. female.   Patient presents with urinary burning, urinary urgency, urinary frequency that has been present for 2 days.  Patient also reports a white vaginal discharge.  Denies any abdominal pain, fever, pelvic pain, irregular vaginal bleeding but does endorse some right-sided lower back pain.  Patient is currently on her menstrual cycle.  Denies any known exposure to STD but has had unprotected sexual intercourse with her husband recently.    Past Medical History:  Diagnosis Date   Allergy    allergic rhinitis   Asthma    Gestational diabetes    with first pregnancy   Obesity    Pyelonephritis 06/2016    Patient Active Problem List   Diagnosis Date Noted   Postpartum state 07/10/2016   Acute pyelonephritis 07/10/2016   SIRS (systemic inflammatory response syndrome) (HCC) 07/10/2016   Acute hypokalemia 07/10/2016   Acute hyperglycemia 07/10/2016   Anemia 07/10/2016   Indication for care or intervention in labor or delivery 06/07/2016   IUGR (intrauterine growth restriction) 09/16/2013   Rh negative state in antepartum period 07/31/2013   BMI 40.0-44.9, adult (HCC) 07/31/2013   Allergic rhinitis 01/29/2013    Past Surgical History:  Procedure Laterality Date   NO PAST SURGERIES     WISDOM TOOTH EXTRACTION      OB History     Gravida  2   Para  2   Term  2   Preterm      AB      Living  2      SAB      IAB      Ectopic      Multiple  0   Live Births  2            Home Medications    Prior to Admission medications   Medication Sig Start Date End Date Taking? Authorizing Provider  nitrofurantoin, macrocrystal-monohydrate, (MACROBID) 100 MG capsule Take 1 capsule (100 mg total) by mouth 2 (two) times daily. 08/16/21  Yes Rylee Nuzum,  Acie Fredrickson, FNP    Family History Family History  Problem Relation Age of Onset   Hypertension Mother    Diabetes Father    Cancer Other    Asthma Other     Social History Social History   Tobacco Use   Smoking status: Never   Smokeless tobacco: Never  Vaping Use   Vaping Use: Never used  Substance Use Topics   Alcohol use: No   Drug use: No     Allergies   Bee venom and Other   Review of Systems Review of Systems Per HPI  Physical Exam Triage Vital Signs ED Triage Vitals  Enc Vitals Group     BP 08/16/21 1807 138/81     Pulse Rate 08/16/21 1807 89     Resp 08/16/21 1807 18     Temp 08/16/21 1807 98.6 F (37 C)     Temp Source 08/16/21 1807 Oral     SpO2 08/16/21 1807 99 %     Weight 08/16/21 1808 280 lb (127 kg)     Height 08/16/21 1808 5' 6.5" (1.689 m)     Head Circumference --      Peak Flow --  Pain Score 08/16/21 1807 10     Pain Loc --      Pain Edu? --      Excl. in GC? --    No data found.  Updated Vital Signs BP 138/81 (BP Location: Left Arm)   Pulse 89   Temp 98.6 F (37 C) (Oral)   Resp 18   Ht 5' 6.5" (1.689 m)   Wt 280 lb (127 kg)   LMP 08/10/2021   SpO2 99%   BMI 44.52 kg/m   Visual Acuity Right Eye Distance:   Left Eye Distance:   Bilateral Distance:    Right Eye Near:   Left Eye Near:    Bilateral Near:     Physical Exam Constitutional:      General: She is not in acute distress.    Appearance: Normal appearance. She is not toxic-appearing or diaphoretic.  HENT:     Head: Normocephalic and atraumatic.  Eyes:     Extraocular Movements: Extraocular movements intact.     Conjunctiva/sclera: Conjunctivae normal.  Cardiovascular:     Rate and Rhythm: Normal rate and regular rhythm.     Pulses: Normal pulses.     Heart sounds: Normal heart sounds.  Pulmonary:     Effort: Pulmonary effort is normal. No respiratory distress.     Breath sounds: Normal breath sounds.  Abdominal:     General: Bowel sounds are  normal. There is no distension.     Palpations: Abdomen is soft.     Tenderness: There is no abdominal tenderness.  Genitourinary:    Comments: Deferred with shared decision making.  Self swab performed. Musculoskeletal:     Cervical back: Normal.     Thoracic back: Normal.     Lumbar back: No swelling, edema, tenderness or bony tenderness. Negative right straight leg raise test and negative left straight leg raise test.  Neurological:     General: No focal deficit present.     Mental Status: She is alert and oriented to person, place, and time. Mental status is at baseline.  Psychiatric:        Mood and Affect: Mood normal.        Behavior: Behavior normal.        Thought Content: Thought content normal.        Judgment: Judgment normal.     UC Treatments / Results  Labs (all labs ordered are listed, but only abnormal results are displayed) Labs Reviewed  POCT URINALYSIS DIP (MANUAL ENTRY) - Abnormal; Notable for the following components:      Result Value   Clarity, UA cloudy (*)    Ketones, POC UA moderate (40) (*)    Blood, UA large (*)    Protein Ur, POC =100 (*)    Leukocytes, UA Moderate (2+) (*)    All other components within normal limits  URINE CULTURE  CERVICOVAGINAL ANCILLARY ONLY    EKG   Radiology No results found.  Procedures Procedures (including critical care time)  Medications Ordered in UC Medications - No data to display  Initial Impression / Assessment and Plan / UC Course  I have reviewed the triage vital signs and the nursing notes.  Pertinent labs & imaging results that were available during my care of the patient were reviewed by me and considered in my medical decision making (see chart for details).     Urinalysis indicating urinary tract infection.  Will treat Macrobid x5 days.  Urine culture is pending.  Cervicovaginal swab  pending to rule out other etiologies due to vaginal discharge being present.Discussed strict return  precautions. Patient verbalized understanding and is agreeable with plan.  Final Clinical Impressions(s) / UC Diagnoses   Final diagnoses:  Acute cystitis with hematuria  Dysuria  Urinary frequency  Vaginal discharge     Discharge Instructions      Your urinalysis did show signs of urinary tract infection.  You are being treated with Macrobid antibiotic.  Your urine culture and vaginal swab are pending.  We will call if they are positive or if there are any abnormalities.  Please increase clear oral fluid intake as well.     ED Prescriptions     Medication Sig Dispense Auth. Provider   nitrofurantoin, macrocrystal-monohydrate, (MACROBID) 100 MG capsule Take 1 capsule (100 mg total) by mouth 2 (two) times daily. 10 capsule Gustavus Bryant, [place]      PDMP not reviewed this encounter.   Gustavus Bryant, [place] 08/16/21 [phone]

## 2021-08-16 NOTE — ED Triage Notes (Signed)
Patient c/o possible UTI x 2 days, urinary urgency and pressure.  No hematuria.

## 2021-08-16 NOTE — Discharge Instructions (Signed)
Your urinalysis did show signs of urinary tract infection.  You are being treated with Macrobid antibiotic.  Your urine culture and vaginal swab are pending.  We will call if they are positive or if there are any abnormalities.  Please increase clear oral fluid intake as well.

## 2021-08-18 LAB — CERVICOVAGINAL ANCILLARY ONLY
Bacterial Vaginitis (gardnerella): NEGATIVE
Candida Glabrata: NEGATIVE
Candida Vaginitis: NEGATIVE
Chlamydia: NEGATIVE
Comment: NEGATIVE
Comment: NEGATIVE
Comment: NEGATIVE
Comment: NEGATIVE
Comment: NEGATIVE
Comment: NORMAL
Neisseria Gonorrhea: NEGATIVE
Trichomonas: NEGATIVE

## 2021-08-19 LAB — URINE CULTURE: Culture: >=100000 — AB

## 2021-09-04 DIAGNOSIS — A6000 Herpesviral infection of urogenital system, unspecified: Secondary | ICD-10-CM | POA: Insufficient documentation

## 2021-11-29 ENCOUNTER — Other Ambulatory Visit: Payer: Self-pay

## 2021-11-29 ENCOUNTER — Ambulatory Visit
Admission: EM | Admit: 2021-11-29 | Discharge: 2021-11-29 | Disposition: A | Discharge status: Home / Self Care | Payer: Managed Care, Other (non HMO) | Attending: Urgent Care | Admitting: Urgent Care

## 2021-11-29 DIAGNOSIS — R058 Other specified cough: Secondary | ICD-10-CM

## 2021-11-29 DIAGNOSIS — J3489 Other specified disorders of nose and nasal sinuses: Secondary | ICD-10-CM | POA: Diagnosis not present

## 2021-11-29 DIAGNOSIS — B349 Viral infection, unspecified: Secondary | ICD-10-CM | POA: Diagnosis not present

## 2021-11-29 DIAGNOSIS — R070 Pain in throat: Secondary | ICD-10-CM | POA: Diagnosis not present

## 2021-11-29 DIAGNOSIS — J309 Allergic rhinitis, unspecified: Secondary | ICD-10-CM

## 2021-11-29 DIAGNOSIS — J4530 Mild persistent asthma, uncomplicated: Secondary | ICD-10-CM

## 2021-11-29 MED ORDER — BENZONATATE 100 MG PO CAPS: 100.0000 mg | ORAL_CAPSULE | Freq: Three times a day (TID) | ORAL | 0 refills | Status: DC | PRN

## 2021-11-29 MED ORDER — ALBUTEROL SULFATE HFA 108 (90 BASE) MCG/ACT IN AERS: 1.0000 | INHALATION_SPRAY | Freq: Four times a day (QID) | RESPIRATORY_TRACT | 0 refills | Status: DC | PRN

## 2021-11-29 MED ORDER — PROMETHAZINE-DM 6.25-15 MG/5ML PO SYRP: 5.0000 mL | ORAL_SOLUTION | Freq: Every evening | ORAL | 0 refills | Status: DC | PRN

## 2021-11-29 MED ORDER — PREDNISONE 20 MG PO TABS: ORAL_TABLET | ORAL | 0 refills | Status: DC

## 2021-11-29 MED ORDER — LEVOCETIRIZINE DIHYDROCHLORIDE 5 MG PO TABS: 5.0000 mg | ORAL_TABLET | Freq: Every evening | ORAL | 0 refills | Status: DC

## 2021-11-29 NOTE — ED Triage Notes (Signed)
Pt c/o sore throat and nasal congestion since Friday. States developed a fever of 102.6 last night, took cold and flu tabs with relief.

## 2021-11-29 NOTE — ED Provider Notes (Signed)
[facility]   MRN: [id number] DOB: 1990/12/13  Subjective:   Lauren [name] is a 31 y.o. female presenting for 5 day history of throat pain, runny and stuffy nose, right ear pain, productive cough that elicits shob, fevers.  Patient has a history of asthma and has shortness of breath intermittently.  Would like an albuterol inhaler refill.  No active chest pain or wheezing.  No exposure to strep.  She does not have any children.  She does work at a [employer].  No current facility-administered medications for this encounter. No current outpatient medications on file.   Allergies  Allergen Reactions   Bee Venom Swelling   Other Hives and Other (See Comments)    Pt states that she is allergic to Zaxby's zax sauce.    Past Medical History:  Diagnosis Date   Allergy    allergic rhinitis   Asthma    Gestational diabetes    with first pregnancy   Obesity    Pyelonephritis 06/2016     Past Surgical History:  Procedure Laterality Date   NO PAST SURGERIES     WISDOM TOOTH EXTRACTION      Family History  Problem Relation Age of Onset   Hypertension Mother    Diabetes Father    Cancer Other    Asthma Other     Social History   Tobacco Use   Smoking status: Never   Smokeless tobacco: Never  Vaping Use   Vaping Use: Never used  Substance Use Topics   Alcohol use: No   Drug use: No    ROS   Objective:   Vitals: BP (!) 143/100 (BP Location: Left Arm)    Pulse 97    Temp 99 F (37.2 C) (Oral)    Resp 18    LMP 11/20/2021    SpO2 96%   Physical Exam Constitutional:      General: She is not in acute distress.    Appearance: Normal appearance. She is well-developed and normal weight. She is not ill-appearing, toxic-appearing or diaphoretic.  HENT:     Head: Normocephalic and atraumatic.     Right Ear: Tympanic membrane, ear canal and external ear normal. No drainage, swelling or tenderness. No middle ear effusion. There is no impacted cerumen.  Tympanic membrane is not erythematous.     Left Ear: Tympanic membrane, ear canal and external ear normal. No drainage, swelling or tenderness.  No middle ear effusion. There is no impacted cerumen. Tympanic membrane is not erythematous.     Nose: Nose normal. No congestion or rhinorrhea.     Mouth/Throat:     Mouth: Mucous membranes are moist. No oral lesions.     Pharynx: No pharyngeal swelling, oropharyngeal exudate, posterior oropharyngeal erythema or uvula swelling.     Tonsils: No tonsillar exudate or tonsillar abscesses.  Eyes:     General: No scleral icterus.       Right eye: No discharge.        Left eye: No discharge.     Extraocular Movements: Extraocular movements intact.     Right eye: Normal extraocular motion.     Left eye: Normal extraocular motion.     Conjunctiva/sclera: Conjunctivae normal.  Cardiovascular:     Rate and Rhythm: Normal rate.     Heart sounds: No murmur heard.   No friction rub. No gallop.  Pulmonary:     Effort: Pulmonary effort is normal. No respiratory distress.     Breath sounds: No  stridor. No wheezing, rhonchi or rales.  Chest:     Chest wall: No tenderness.  Musculoskeletal:     Cervical back: Normal range of motion and neck supple.  Lymphadenopathy:     Cervical: No cervical adenopathy.  Skin:    General: Skin is warm and dry.  Neurological:     General: No focal deficit present.     Mental Status: She is alert and oriented to person, place, and time.  Psychiatric:        Mood and Affect: Mood normal.        Behavior: Behavior normal.    Assessment and Plan :   PDMP not reviewed this encounter.  1. Acute viral syndrome   2. Stuffy and runny nose   3. Productive cough   4. Throat pain   5. Allergic rhinitis, unspecified seasonality, unspecified trigger   6. Mild persistent asthma without complication     Deferred imaging given clear cardiopulmonary exam, hemodynamically stable vital signs. Does not meet [facility] criteria for  strep testing.  In the context of her asthma and shortness of breath offered her an oral prednisone course.  Refilled her albuterol inhaler as requested.  Otherwise, will manage for viral illness such as viral URI, viral syndrome, viral rhinitis, COVID-19. Recommended supportive care. Offered scripts for symptomatic relief. Testing is pending. Counseled patient on potential for adverse effects with medications prescribed/recommended today, ER and return-to-clinic precautions discussed, patient verbalized understanding.     Wallis Bamberg, PA-C 11/29/21 1059

## 2021-11-29 NOTE — Discharge Instructions (Addendum)
We will notify you of your test results as they arrive and may take between 48-72 hours.  I encourage you to sign up for MyChart if you have not already done so as this can be the easiest way for [place] to communicate results to you online or through a phone app.  Generally, we only contact you if it is a positive test result.  In the meantime, if you develop worsening symptoms including fever, chest pain, shortness of breath despite our current treatment plan then please report to the emergency room as this may be a sign of worsening status from possible viral infection.  Otherwise, we will manage this as a viral syndrome. For sore throat or cough try using a honey-based tea. Use 3 teaspoons of honey with juice squeezed from half lemon. Place shaved pieces of ginger into 1/2-1 cup of water and warm over stove top. Then mix the ingredients and repeat every 4 hours as needed. Please take Tylenol 500mg -650mg  every 6 hours for aches and pains, fevers. Hydrate very well with at least 2 liters of water. Eat light meals such as soups to replenish electrolytes and soft fruits, veggies. Start an antihistamine like Zyrtec for postnasal drainage, sinus congestion.  You can take this together with prednisone, cough medications. Use your albuterol inhaler too once every 4-6 hours as needed.

## 2021-12-01 LAB — NOVEL CORONAVIRUS, NAA: SARS-CoV-2, NAA: DETECTED — AB

## 2022-01-16 ENCOUNTER — Other Ambulatory Visit: Payer: Self-pay

## 2022-01-16 ENCOUNTER — Ambulatory Visit
Admission: EM | Admit: 2022-01-16 | Discharge: 2022-01-16 | Disposition: A | Discharge status: Home / Self Care | Payer: Managed Care, Other (non HMO) | Attending: Physician Assistant | Admitting: Physician Assistant

## 2022-01-16 DIAGNOSIS — J029 Acute pharyngitis, unspecified: Secondary | ICD-10-CM | POA: Insufficient documentation

## 2022-01-16 LAB — POCT RAPID STREP A (OFFICE): Rapid Strep A Screen: NEGATIVE

## 2022-01-16 MED ORDER — PREDNISONE 20 MG PO TABS: 40.0000 mg | ORAL_TABLET | Freq: Every day | ORAL | 0 refills | Status: AC

## 2022-01-16 NOTE — ED Provider Notes (Signed)
?[facility] ? ? ? ?CSN: [id number] ?Arrival date & time: 01/16/22  1741 ? ? ?  ? ?History   ?Chief Complaint ?Chief Complaint  ?Patient presents with  ? Sore Throat  ? ? ?HPI ?Lauren [name] is a 31 y.o. female.  ? ?Patient here today for evaluation of sore throat she has had for 2 weeks.  She reports some pain with swallowing and feels as if her tonsils are swollen.  She states she has been gargling warm salt water with temporary relief.  She does have history of tonsillitis.  She has not had any vomiting or diarrhea.  She denies any fever.  She has not had any cough or runny nose.  Her sore throat seems to be worse at night. ? ?The history is provided by the patient.  ?Sore Throat ?Pertinent negatives include no abdominal pain and no shortness of breath.  ? ?Past Medical History:  ?Diagnosis Date  ? Allergy   ? allergic rhinitis  ? Asthma   ? Gestational diabetes   ? with first pregnancy  ? Obesity   ? Pyelonephritis 06/2016  ? ? ?Patient Active Problem List  ? Diagnosis Date Noted  ? Postpartum state 07/10/2016  ? Acute pyelonephritis 07/10/2016  ? SIRS (systemic inflammatory response syndrome) (HCC) 07/10/2016  ? Acute hypokalemia 07/10/2016  ? Acute hyperglycemia 07/10/2016  ? Anemia 07/10/2016  ? Indication for care or intervention in labor or delivery 06/07/2016  ? IUGR (intrauterine growth restriction) 09/16/2013  ? Rh negative state in antepartum period 07/31/2013  ? BMI 40.0-44.9, adult (HCC) 07/31/2013  ? Allergic rhinitis 01/29/2013  ? ? ?Past Surgical History:  ?Procedure Laterality Date  ? NO PAST SURGERIES    ? WISDOM TOOTH EXTRACTION    ? ? ?OB History   ? ? Gravida  ?2  ? Para  ?2  ? Term  ?2  ? Preterm  ?   ? AB  ?   ? Living  ?2  ?  ? ? SAB  ?   ? IAB  ?   ? Ectopic  ?   ? Multiple  ?0  ? Live Births  ?2  ?   ?  ?  ? ? ? ?Home Medications   ? ?Prior to Admission medications   ?Medication Sig Start Date End Date Taking? Authorizing Provider  ?predniSONE (DELTASONE) 20 MG tablet Take 2  tablets (40 mg total) by mouth daily with breakfast for 5 days. 01/16/22 01/21/22 Yes Tomi BambergerMyers, Griselda Tosh F, PA-C  ?albuterol (VENTOLIN HFA) 108 (90 Base) MCG/ACT inhaler Inhale 1-2 puffs into the lungs every 6 (six) hours as needed for wheezing or shortness of breath. 11/29/21   Wallis BambergMani, Mario, PA-C  ?benzonatate (TESSALON) 100 MG capsule Take 1-2 capsules (100-200 mg total) by mouth 3 (three) times daily as needed for cough. 11/29/21   Wallis BambergMani, Mario, PA-C  ?levocetirizine (XYZAL) 5 MG tablet Take 1 tablet (5 mg total) by mouth every evening. 11/29/21   Wallis BambergMani, Mario, PA-C  ?promethazine-dextromethorphan (PROMETHAZINE-DM) 6.25-15 MG/5ML syrup Take 5 mLs by mouth at bedtime as needed for cough. 11/29/21   Wallis BambergMani, Mario, PA-C  ? ? ?Family History ?Family History  ?Problem Relation Age of Onset  ? Hypertension Mother   ? Diabetes Father   ? Cancer Other   ? Asthma Other   ? ? ?Social History ?Social History  ? ?Tobacco Use  ? Smoking status: Never  ? Smokeless tobacco: Never  ?Vaping Use  ? Vaping Use: Never used  ?  Substance Use Topics  ? Alcohol use: No  ? Drug use: No  ? ? ? ?Allergies   ?Bee venom and Other ? ? ?Review of Systems ?Review of Systems  ?Constitutional:  Negative for chills and fever.  ?HENT:  Positive for sore throat. Negative for congestion and ear pain.   ?Eyes:  Negative for discharge and redness.  ?Respiratory:  Negative for cough and shortness of breath.   ?Gastrointestinal:  Negative for abdominal pain, diarrhea, nausea and vomiting.  ? ? ?Physical Exam ?Triage Vital Signs ?ED Triage Vitals  ?Enc Vitals Group  ?   BP 01/16/22 1825 132/85  ?   Pulse Rate 01/16/22 1825 98  ?   Resp 01/16/22 1825 18  ?   Temp 01/16/22 1825 98.1 ?F (36.7 ?C)  ?   Temp Source 01/16/22 1825 Oral  ?   SpO2 01/16/22 1825 98 %  ?   Weight --   ?   Height --   ?   Head Circumference --   ?   Peak Flow --   ?   Pain Score 01/16/22 1824 5  ?   Pain Loc --   ?   Pain Edu? --   ?   Excl. in GC? --   ? ?No data found. ? ?Updated Vital Signs ?BP 132/85  (BP Location: Right Arm)   Pulse 98   Temp 98.1 ?F (36.7 ?C) (Oral)   Resp 18   SpO2 98%  ?   ? ?Physical Exam ?Vitals and nursing note reviewed.  ?Constitutional:   ?   General: She is not in acute distress. ?   Appearance: Normal appearance. She is not ill-appearing.  ?HENT:  ?   Head: Normocephalic and atraumatic.  ?   Mouth/Throat:  ?   Mouth: Mucous membranes are moist. No oral lesions.  ?   Pharynx: Posterior oropharyngeal erythema present. No oropharyngeal exudate.  ?Eyes:  ?   Conjunctiva/sclera: Conjunctivae normal.  ?Cardiovascular:  ?   Rate and Rhythm: Normal rate.  ?Pulmonary:  ?   Effort: Pulmonary effort is normal.  ?Neurological:  ?   Mental Status: She is alert.  ?Psychiatric:     ?   Mood and Affect: Mood normal.     ?   Behavior: Behavior normal.     ?   Thought Content: Thought content normal.  ? ? ? ?UC Treatments / Results  ?Labs ?(all labs ordered are listed, but only abnormal results are displayed) ?Labs Reviewed  ?CULTURE, GROUP A STREP [facility])  ?POCT RAPID STREP A (OFFICE)  ? ? ?EKG ? ? ?Radiology ?No results found. ? ?Procedures ?Procedures (including critical care time) ? ?Medications Ordered in UC ?Medications - No data to display ? ?Initial Impression / Assessment and Plan / UC Course  ?I have reviewed the triage vital signs and the nursing notes. ? ?Pertinent labs & imaging results that were available during my care of the patient were reviewed by me and considered in my medical decision making (see chart for details). ? ?  ?Strep test negative in office.  Will order culture.  Prednisone prescribed to hopefully help with inflammation and discomfort.  Encouraged follow-up with any further concerns. ? ?Final Clinical Impressions(s) / UC Diagnoses  ? ?Final diagnoses:  ?Acute pharyngitis, unspecified etiology  ? ?Discharge Instructions   ?None ?  ? ?ED Prescriptions   ? ? Medication Sig Dispense Auth. Provider  ? predniSONE (DELTASONE) 20 MG tablet Take 2 tablets (40 mg total)  by mouth  daily with breakfast for 5 days. 10 tablet Tomi Bamberger, PA-C  ? ?  ? ?PDMP not reviewed this encounter. ?  ?Tomi Bamberger, PA-C ?01/16/22 2005 ? ?

## 2022-01-16 NOTE — ED Triage Notes (Signed)
2 week h/o sore throat and pain with swallowing. No meds taken. Has been gargling with warm salt water w/temporary relief. Pt reports that her nodes have felt swollen since January. No v/d. No cough or runny nose. Throat pain is worse at night. ?

## 2022-01-19 LAB — CULTURE, GROUP A STREP (THRC)

## 2022-10-07 ENCOUNTER — Other Ambulatory Visit: Payer: Self-pay

## 2022-10-07 ENCOUNTER — Emergency Department (HOSPITAL_COMMUNITY)
Admission: EM | Admit: 2022-10-07 | Discharge: 2022-10-07 | Disposition: A | Discharge status: Home / Self Care | Payer: Managed Care, Other (non HMO) | Attending: Emergency Medicine | Admitting: Emergency Medicine

## 2022-10-07 ENCOUNTER — Encounter (HOSPITAL_COMMUNITY): Payer: Self-pay

## 2022-10-07 DIAGNOSIS — Y92410 Unspecified street and highway as the place of occurrence of the external cause: Secondary | ICD-10-CM | POA: Diagnosis not present

## 2022-10-07 DIAGNOSIS — S39012A Strain of muscle, fascia and tendon of lower back, initial encounter: Secondary | ICD-10-CM | POA: Insufficient documentation

## 2022-10-07 DIAGNOSIS — M62830 Muscle spasm of back: Secondary | ICD-10-CM | POA: Diagnosis not present

## 2022-10-07 DIAGNOSIS — S3992XA Unspecified injury of lower back, initial encounter: Secondary | ICD-10-CM | POA: Diagnosis present

## 2022-10-07 MED ORDER — METHOCARBAMOL 500 MG PO TABS: 500.0000 mg | ORAL_TABLET | Freq: Two times a day (BID) | ORAL | 0 refills | Status: DC
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
## 2022-11-23 ENCOUNTER — Encounter (HOSPITAL_BASED_OUTPATIENT_CLINIC_OR_DEPARTMENT_OTHER): Payer: Self-pay

## 2022-12-13 ENCOUNTER — Ambulatory Visit (HOSPITAL_BASED_OUTPATIENT_CLINIC_OR_DEPARTMENT_OTHER): Payer: Managed Care, Other (non HMO) | Admitting: Family Medicine

## 2022-12-20 ENCOUNTER — Ambulatory Visit (INDEPENDENT_AMBULATORY_CARE_PROVIDER_SITE_OTHER): Payer: Managed Care, Other (non HMO)

## 2022-12-20 ENCOUNTER — Encounter: Payer: Self-pay | Admitting: Sports Medicine

## 2022-12-20 ENCOUNTER — Ambulatory Visit: Payer: Managed Care, Other (non HMO) | Admitting: Sports Medicine

## 2022-12-20 DIAGNOSIS — G8929 Other chronic pain: Secondary | ICD-10-CM

## 2022-12-20 DIAGNOSIS — M47816 Spondylosis without myelopathy or radiculopathy, lumbar region: Secondary | ICD-10-CM

## 2022-12-20 DIAGNOSIS — M545 Low back pain, unspecified: Secondary | ICD-10-CM

## 2022-12-20 DIAGNOSIS — M48061 Spinal stenosis, lumbar region without neurogenic claudication: Secondary | ICD-10-CM

## 2022-12-20 MED ORDER — DICLOFENAC SODIUM 75 MG PO TBEC: 75.0000 mg | DELAYED_RELEASE_TABLET | Freq: Two times a day (BID) | ORAL | 0 refills | Status: AC

## 2022-12-20 NOTE — Patient Instructions (Addendum)
Primary Care Physician Offices:   [facility], MD Family Medicine  [place], [place] [address] [facility]  Internist in [place], [place] Address: [address] Entr[address]; Ground Floor - [facility], [place], [place] [address] Hours:  Closes soon ? 5?PM ? Opens 9?AM Fri Phone: [phone]
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
## 2023-01-09 ENCOUNTER — Other Ambulatory Visit: Payer: Self-pay

## 2023-01-09 ENCOUNTER — Ambulatory Visit: Payer: Managed Care, Other (non HMO) | Attending: Sports Medicine

## 2023-01-09 DIAGNOSIS — M6281 Muscle weakness (generalized): Secondary | ICD-10-CM | POA: Insufficient documentation

## 2023-01-09 DIAGNOSIS — M5459 Other low back pain: Secondary | ICD-10-CM | POA: Insufficient documentation

## 2023-01-09 DIAGNOSIS — M47816 Spondylosis without myelopathy or radiculopathy, lumbar region: Secondary | ICD-10-CM | POA: Insufficient documentation

## 2023-01-23 ENCOUNTER — Ambulatory Visit: Payer: Managed Care, Other (non HMO) | Attending: Sports Medicine

## 2023-01-23 DIAGNOSIS — M47816 Spondylosis without myelopathy or radiculopathy, lumbar region: Secondary | ICD-10-CM | POA: Insufficient documentation

## 2023-01-23 DIAGNOSIS — M5459 Other low back pain: Secondary | ICD-10-CM | POA: Insufficient documentation

## 2023-01-23 DIAGNOSIS — M6281 Muscle weakness (generalized): Secondary | ICD-10-CM
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
## 2023-01-29 ENCOUNTER — Ambulatory Visit: Payer: Managed Care, Other (non HMO)

## 2023-01-29 DIAGNOSIS — M6281 Muscle weakness (generalized): Secondary | ICD-10-CM

## 2023-01-29 DIAGNOSIS — M5459 Other low back pain: Secondary | ICD-10-CM | POA: Diagnosis not present

## 2023-01-29 DIAGNOSIS — M47816 Spondylosis without myelopathy or radiculopathy, lumbar region: Secondary | ICD-10-CM

## 2023-01-30 ENCOUNTER — Ambulatory Visit: Payer: Managed Care, Other (non HMO)

## 2023-01-30 DIAGNOSIS — M5459 Other low back pain: Secondary | ICD-10-CM

## 2023-01-30 DIAGNOSIS — M47816 Spondylosis without myelopathy or radiculopathy, lumbar region: Secondary | ICD-10-CM

## 2023-01-30 DIAGNOSIS — M6281 Muscle weakness (generalized): Secondary | ICD-10-CM
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
## 2023-02-05 ENCOUNTER — Ambulatory Visit: Payer: Managed Care, Other (non HMO)

## 2023-02-05 DIAGNOSIS — M6281 Muscle weakness (generalized): Secondary | ICD-10-CM

## 2023-02-05 DIAGNOSIS — M47816 Spondylosis without myelopathy or radiculopathy, lumbar region: Secondary | ICD-10-CM

## 2023-02-05 DIAGNOSIS — M5459 Other low back pain: Secondary | ICD-10-CM | POA: Diagnosis not present

## 2023-02-07 ENCOUNTER — Ambulatory Visit: Payer: Managed Care, Other (non HMO)

## 2023-02-07 DIAGNOSIS — M6281 Muscle weakness (generalized): Secondary | ICD-10-CM

## 2023-02-07 DIAGNOSIS — M47816 Spondylosis without myelopathy or radiculopathy, lumbar region: Secondary | ICD-10-CM

## 2023-02-07 DIAGNOSIS — M5459 Other low back pain: Secondary | ICD-10-CM | POA: Diagnosis not present
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
## 2023-02-12 ENCOUNTER — Ambulatory Visit: Payer: Managed Care, Other (non HMO)

## 2023-02-13 ENCOUNTER — Ambulatory Visit: Payer: Managed Care, Other (non HMO)

## 2023-02-13 DIAGNOSIS — M5459 Other low back pain: Secondary | ICD-10-CM | POA: Diagnosis not present

## 2023-02-13 DIAGNOSIS — M47816 Spondylosis without myelopathy or radiculopathy, lumbar region: Secondary | ICD-10-CM
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
## 2023-02-19 ENCOUNTER — Ambulatory Visit: Payer: Managed Care, Other (non HMO)

## 2023-02-19 DIAGNOSIS — M47816 Spondylosis without myelopathy or radiculopathy, lumbar region: Secondary | ICD-10-CM

## 2023-02-19 DIAGNOSIS — M5459 Other low back pain: Secondary | ICD-10-CM | POA: Diagnosis not present

## 2023-02-19 DIAGNOSIS — M6281 Muscle weakness (generalized): Secondary | ICD-10-CM

## 2023-02-21 ENCOUNTER — Ambulatory Visit: Payer: Managed Care, Other (non HMO)

## 2023-03-14 ENCOUNTER — Encounter (HOSPITAL_COMMUNITY): Payer: Self-pay | Admitting: *Deleted

## 2023-03-14 ENCOUNTER — Ambulatory Visit (HOSPITAL_COMMUNITY)
Admission: EM | Admit: 2023-03-14 | Discharge: 2023-03-14 | Disposition: A | Discharge status: Home / Self Care | Payer: Managed Care, Other (non HMO) | Attending: Emergency Medicine | Admitting: Emergency Medicine

## 2023-03-14 DIAGNOSIS — R1030 Lower abdominal pain, unspecified: Secondary | ICD-10-CM | POA: Diagnosis present

## 2023-03-14 LAB — POCT URINALYSIS DIP (MANUAL ENTRY)
Bilirubin, UA: NEGATIVE
Blood, UA: NEGATIVE
Glucose, UA: NEGATIVE mg/dL
Leukocytes, UA: NEGATIVE
Nitrite, UA: NEGATIVE
Protein Ur, POC: NEGATIVE mg/dL
Spec Grav, UA: 1.025 (ref 1.010–1.025)
Urobilinogen, UA: 0.2 E.U./dL
pH, UA: 5.5 (ref 5.0–8.0)

## 2023-03-14 NOTE — ED Triage Notes (Signed)
Pt states she has been having lower abdominal pain x 1 week but worse today. She started having diarrhea today as well. She hasn't taken any meds for her sx.

## 2023-03-14 NOTE — Discharge Instructions (Addendum)
Your urine has no sign of infection. We will wait for the cytology swab which may take a day to result.  If anything returns positive, we will call you and treat you. If your swab is normal, I recommend trying a stool softener to see if constipation is the cause of your abdominal discomfort.  Continue hydrating and drinking lots of fluids. You can try using tylenol for pain.  If you are still having problems please return for evaluation.  If at any point you get worse, please go directly to the emergency department.

## 2023-03-14 NOTE — ED Provider Notes (Signed)
[facility]    CSN: [id number] Arrival date & time: 03/14/23  1512      History   Chief Complaint Chief Complaint  Patient presents with   Abdominal Pain   Diarrhea    HPI Lauren [name] is a 32 y.o. female.  Here with 1 week of lower abdominal discomfort. Comes and goes. Was feeling worse today, 7/10 A little nausea that began today but no vomiting. She had [place] food last night that caused some soft stools today. No blood in stool. Otherwise was a little constipated last week with her menstrual cycle  Tolerating fluids by mouth No fevers  Denies urinary symptoms although discomfort is felt "above the bladder". Reports history of UTI that may have been similar A little vaginal discharge. Monogamous relationship with husband. LMP 5/12  Past Medical History:  Diagnosis Date   Allergy    allergic rhinitis   Asthma    Gestational diabetes    with first pregnancy   Obesity    Pyelonephritis 06/2016    Patient Active Problem List   Diagnosis Date Noted   Postpartum state 07/10/2016   Acute pyelonephritis 07/10/2016   SIRS (systemic inflammatory response syndrome) (HCC) 07/10/2016   Acute hypokalemia 07/10/2016   Acute hyperglycemia 07/10/2016   Anemia 07/10/2016   Indication for care or intervention in labor or delivery 06/07/2016   IUGR (intrauterine growth restriction) 09/16/2013   Rh negative state in antepartum period 07/31/2013   BMI 40.0-44.9, adult (HCC) 07/31/2013   Allergic rhinitis 01/29/2013    Past Surgical History:  Procedure Laterality Date   NO PAST SURGERIES     WISDOM TOOTH EXTRACTION      OB History     Gravida  2   Para  2   Term  2   Preterm      AB      Living  2      SAB      IAB      Ectopic      Multiple  0   Live Births  2            Home Medications    Prior to Admission medications   Not on File    Family History Family History  Problem Relation Age of Onset   Hypertension Mother     Diabetes Father    Cancer Other    Asthma Other     Social History Social History   Tobacco Use   Smoking status: Never   Smokeless tobacco: Never  Vaping Use   Vaping Use: Never used  Substance Use Topics   Alcohol use: No   Drug use: No     Allergies   Bee venom and Other   Review of Systems Review of Systems As per HPI  Physical Exam Triage Vital Signs ED Triage Vitals  Enc Vitals Group     BP 03/14/23 1619 (!) 135/92     Pulse Rate 03/14/23 1619 74     Resp 03/14/23 1619 18     Temp 03/14/23 1619 98.8 F (37.1 C)     Temp Source 03/14/23 1619 Oral     SpO2 03/14/23 1619 98 %     Weight --      Height --      Head Circumference --      Peak Flow --      Pain Score 03/14/23 1617 7     Pain Loc --  Pain Edu? --      Excl. in GC? --    No data found.  Updated Vital Signs BP (!) 135/92 (BP Location: Left Arm)   Pulse 74   Temp 98.8 F (37.1 C) (Oral)   Resp 18   LMP 03/03/2023 (Exact Date)   SpO2 98%    Physical Exam Vitals and nursing note reviewed.  Constitutional:      Appearance: Normal appearance.  HENT:     Mouth/Throat:     Mouth: Mucous membranes are moist.     Pharynx: Oropharynx is clear.  Eyes:     Conjunctiva/sclera: Conjunctivae normal.  Cardiovascular:     Rate and Rhythm: Normal rate and regular rhythm.     Heart sounds: Normal heart sounds.  Pulmonary:     Effort: Pulmonary effort is normal. No respiratory distress.     Breath sounds: Normal breath sounds.  Abdominal:     General: Bowel sounds are normal.     Palpations: Abdomen is soft.     Tenderness: There is abdominal tenderness (mild) in the suprapubic area. There is no right CVA tenderness, left CVA tenderness, guarding or rebound. Negative signs include Murphy's sign and Rovsing's sign.     Comments: Habitus limits exam. No guarding or rebound  Musculoskeletal:        General: Normal range of motion.  Skin:    General: Skin is warm and dry.  Neurological:      Mental Status: She is alert and oriented to person, place, and time.      UC Treatments / Results  Labs (all labs ordered are listed, but only abnormal results are displayed) Labs Reviewed  POCT URINALYSIS DIP (MANUAL ENTRY) - Abnormal; Notable for the following components:      Result Value   Ketones, POC UA moderate (40) (*)    All other components within normal limits  CERVICOVAGINAL ANCILLARY ONLY    EKG   Radiology No results found.  Procedures Procedures (including critical care time)  Medications Ordered in UC Medications - No data to display  Initial Impression / Assessment and Plan / UC Course  I have reviewed the triage vital signs and the nursing notes.  Pertinent labs & imaging results that were available during my care of the patient were reviewed by me and considered in my medical decision making (see chart for details).  UA is negative apart from some ketones. No sign of infection, no need for culture.  Cytology swab pending. Treat positive result if indicated. Discussed possible etiologies of symptoms. No red flags today. Advised if cyto swab negative, recommend to try stool softener to see if constipation is cause. Can return here or follow with PCP for persisting symptoms. ED for any worsening.  Final Clinical Impressions(s) / UC Diagnoses   Final diagnoses:  Lower abdominal pain     Discharge Instructions      Your urine has no sign of infection. We will wait for the cytology swab which may take a day to result.  If anything returns positive, we will call you and treat you. If your swab is normal, I recommend trying a stool softener to see if constipation is the cause of your abdominal discomfort.  Continue hydrating and drinking lots of fluids. You can try using tylenol for pain.  If you are still having problems please return for evaluation.  If at any point you get worse, please go directly to the emergency department.     ED  Prescriptions   None    PDMP not reviewed this encounter.   Homer Miller, Lurena Joiner, [place] 03/14/23 1708

## 2023-03-15 LAB — CERVICOVAGINAL ANCILLARY ONLY
Bacterial Vaginitis (gardnerella): NEGATIVE
Candida Glabrata: NEGATIVE
Candida Vaginitis: NEGATIVE
Chlamydia: NEGATIVE
Comment: NEGATIVE
Comment: NEGATIVE
Comment: NEGATIVE
Comment: NEGATIVE
Comment: NEGATIVE
Comment: NORMAL
Neisseria Gonorrhea: NEGATIVE
Trichomonas: NEGATIVE

## 2023-04-15 ENCOUNTER — Ambulatory Visit: Payer: Self-pay

## 2023-04-15 ENCOUNTER — Other Ambulatory Visit: Payer: Self-pay | Admitting: Family Medicine

## 2023-04-15 DIAGNOSIS — M79641 Pain in right hand: Secondary | ICD-10-CM

## 2023-04-27 ENCOUNTER — Telehealth (HOSPITAL_COMMUNITY): Payer: Self-pay

## 2023-04-27 ENCOUNTER — Ambulatory Visit (HOSPITAL_COMMUNITY)
Admission: EM | Admit: 2023-04-27 | Discharge: 2023-04-27 | Disposition: A | Discharge status: Home / Self Care | Payer: Managed Care, Other (non HMO) | Attending: Internal Medicine | Admitting: Internal Medicine

## 2023-04-27 ENCOUNTER — Encounter (HOSPITAL_COMMUNITY): Payer: Self-pay

## 2023-04-27 DIAGNOSIS — H60393 Other infective otitis externa, bilateral: Secondary | ICD-10-CM | POA: Diagnosis not present

## 2023-04-27 MED ORDER — CIPROFLOXACIN-DEXAMETHASONE 0.3-0.1 % OT SUSP: 4.0000 [drp] | Freq: Two times a day (BID) | OTIC | 0 refills | Status: DC

## 2023-04-27 NOTE — Discharge Instructions (Signed)
You have an ear infection of the ear canal known as otitis externa. Use ear drops as prescribed for 7 days. Do not place anything smaller than elbow deep into ear canal- this includes Q-tips. You may place a small amount of rubbing alcohol onto the end of a Q-tip and place this into the outer ear canal to dry up any remaining water that may have gotten into the ear while showering or submerging head underwater to prevent this type of infection in the future.   If you develop any new or worsening symptoms or do not improve in the next 2 to 3 days, please return.  If your symptoms are severe, please go to the emergency room.  Follow-up with your primary care provider for further evaluation and management of your symptoms as well as ongoing wellness visits.  I hope you feel better!

## 2023-04-27 NOTE — ED Triage Notes (Signed)
Pt reports she thinks she has a right side ear infection. Reports throbbing and draining x 3 days.

## 2023-04-27 NOTE — ED Provider Notes (Signed)
[facility]    CSN: [id number] Arrival date & time: 04/27/23  1055      History   Chief Complaint No chief complaint on file.   HPI Lauren [name] is a 32 y.o. female.   Patient presents to urgent care for evaluation of right-sided ear pain that she describes as "throbbing" and drainage from the right ear that started initially approximately 6 or 7 days ago but worsened over the last 2-3 days. Hearing is normal to both ears. No recent fever, chills, sore throat, headache, dizziness, or tinnitus. Reports she has been swimming recently and wonders if this is the cause of pain. No attempted use of any OTC medications prior to arrival.      Past Medical History:  Diagnosis Date   Allergy    allergic rhinitis   Asthma    Gestational diabetes    with first pregnancy   Obesity    Pyelonephritis 06/2016    Patient Active Problem List   Diagnosis Date Noted   Postpartum state 07/10/2016   Acute pyelonephritis 07/10/2016   SIRS (systemic inflammatory response syndrome) (HCC) 07/10/2016   Acute hypokalemia 07/10/2016   Acute hyperglycemia 07/10/2016   Anemia 07/10/2016   Indication for care or intervention in labor or delivery 06/07/2016   IUGR (intrauterine growth restriction) 09/16/2013   Rh negative state in antepartum period 07/31/2013   BMI 40.0-44.9, adult (HCC) 07/31/2013   Allergic rhinitis 01/29/2013    Past Surgical History:  Procedure Laterality Date   NO PAST SURGERIES     WISDOM TOOTH EXTRACTION      OB History     Gravida  2   Para  2   Term  2   Preterm      AB      Living  2      SAB      IAB      Ectopic      Multiple  0   Live Births  2            Home Medications    Prior to Admission medications   Medication Sig Start Date End Date Taking? Authorizing Provider  ciprofloxacin-dexamethasone (CIPRODEX) OTIC suspension Place 4 drops into both ears 2 (two) times daily for 7 days. 04/27/23 05/04/23  Carlisle Beers, FNP    Family History Family History  Problem Relation Age of Onset   Hypertension Mother    Diabetes Father    Cancer Other    Asthma Other     Social History Social History   Tobacco Use   Smoking status: Never   Smokeless tobacco: Never  Vaping Use   Vaping Use: Never used  Substance Use Topics   Alcohol use: No   Drug use: No     Allergies   Bee venom and Other   Review of Systems Review of Systems Per HPI  Physical Exam Triage Vital Signs ED Triage Vitals  Enc Vitals Group     BP 04/27/23 1114 (!) 123/93     Pulse Rate 04/27/23 1114 71     Resp 04/27/23 1114 18     Temp 04/27/23 1114 98 F (36.7 C)     Temp Source 04/27/23 1114 Oral     SpO2 04/27/23 1114 98 %     Weight --      Height --      Head Circumference --      Peak Flow --  Pain Score 04/27/23 1113 0     Pain Loc --      Pain Edu? --      Excl. in GC? --    No data found.  Updated Vital Signs BP (!) 123/93 (BP Location: Left Arm)   Pulse 71   Temp 98 F (36.7 C) (Oral)   Resp 18   LMP 04/11/2023 (Approximate)   SpO2 98%   Visual Acuity Right Eye Distance:   Left Eye Distance:   Bilateral Distance:    Right Eye Near:   Left Eye Near:    Bilateral Near:     Physical Exam Vitals and nursing note reviewed.  Constitutional:      Appearance: She is not ill-appearing or toxic-appearing.  HENT:     Head: Normocephalic and atraumatic.     Right Ear: Hearing, tympanic membrane and external ear normal. Drainage (Moderate thick purulent/yellowish drainage), swelling (minimal) and tenderness present.     Left Ear: Hearing, tympanic membrane and external ear normal. Drainage (Scant thick purulent drainage) and swelling (minimal) present.     Nose: Nose normal.     Mouth/Throat:     Lips: Pink.  Eyes:     General: Lids are normal. Vision grossly intact. Gaze aligned appropriately.     Extraocular Movements: Extraocular movements intact.     Conjunctiva/sclera:  Conjunctivae normal.  Pulmonary:     Effort: Pulmonary effort is normal.  Musculoskeletal:     Cervical back: Neck supple.  Skin:    General: Skin is warm and dry.     Capillary Refill: Capillary refill takes less than 2 seconds.     Findings: No rash.  Neurological:     General: No focal deficit present.     Mental Status: She is alert and oriented to person, place, and time. Mental status is at baseline.     Cranial Nerves: No dysarthria or facial asymmetry.  Psychiatric:        Mood and Affect: Mood normal.        Speech: Speech normal.        Behavior: Behavior normal.        Thought Content: Thought content normal.        Judgment: Judgment normal.      UC Treatments / Results  Labs (all labs ordered are listed, but only abnormal results are displayed) Labs Reviewed - No data to display  EKG   Radiology No results found.  Procedures Procedures (including critical care time)  Medications Ordered in UC Medications - No data to display  Initial Impression / Assessment and Plan / UC Course  I have reviewed the triage vital signs and the nursing notes.  Pertinent labs & imaging results that were available during my care of the patient were reviewed by me and considered in my medical decision making (see chart for details).   1.  Infective otitis externa of both ears I am able to visualize both tympanic membranes despite drainage to both ear canals, therefore we will treat infective otitis externa bilaterally with Ciprodex eardrops as prescribed for 7 days.  Advised to avoid swimming/submerging head underwater for 7 to 14 days. May use tylenol as needed for pain.  Discussed red flag signs and symptoms of worsening condition,when to call the PCP office, return to urgent care, and when to seek higher level of care in the emergency department. Counseled patient regarding appropriate use of medications and potential side effects for all medications recommended or prescribed  today. Patient verbalizes understanding and agreement with plan. Discharged in stable condition.     Final Clinical Impressions(s) / UC Diagnoses   Final diagnoses:  Infective otitis externa of both ears     Discharge Instructions      You have an ear infection of the ear canal known as otitis externa. Use ear drops as prescribed for 7 days. Do not place anything smaller than elbow deep into ear canal- this includes Q-tips. You may place a small amount of rubbing alcohol onto the end of a Q-tip and place this into the outer ear canal to dry up any remaining water that may have gotten into the ear while showering or submerging head underwater to prevent this type of infection in the future.   If you develop any new or worsening symptoms or do not improve in the next 2 to 3 days, please return.  If your symptoms are severe, please go to the emergency room.  Follow-up with your primary care provider for further evaluation and management of your symptoms as well as ongoing wellness visits.  I hope you feel better!    ED Prescriptions     Medication Sig Dispense Auth. Provider   ciprofloxacin-dexamethasone (CIPRODEX) OTIC suspension  (Status: Discontinued) Place 4 drops into both ears 2 (two) times daily for 7 days. 7.5 mL Reita May M, FNP   ciprofloxacin-dexamethasone (CIPRODEX) OTIC suspension Place 4 drops into both ears 2 (two) times daily for 7 days. 7.5 mL Carlisle Beers, FNP      PDMP not reviewed this encounter.   Carlisle Beers, [place] 04/27/23 1157

## 2023-04-30 DIAGNOSIS — Z8759 Personal history of other complications of pregnancy, childbirth and the puerperium: Secondary | ICD-10-CM | POA: Insufficient documentation

## 2023-04-30 DIAGNOSIS — Z6791 Unspecified blood type, Rh negative: Secondary | ICD-10-CM | POA: Insufficient documentation

## 2023-04-30 DIAGNOSIS — Z8632 Personal history of gestational diabetes: Secondary | ICD-10-CM | POA: Insufficient documentation

## 2023-05-01 ENCOUNTER — Encounter: Payer: Self-pay | Admitting: Certified Nurse Midwife

## 2023-05-01 ENCOUNTER — Ambulatory Visit: Payer: Managed Care, Other (non HMO) | Admitting: Certified Nurse Midwife

## 2023-05-01 ENCOUNTER — Other Ambulatory Visit: Payer: Self-pay

## 2023-05-01 ENCOUNTER — Other Ambulatory Visit (HOSPITAL_COMMUNITY)
Admission: RE | Admit: 2023-05-01 | Discharge: 2023-05-01 | Disposition: A | Discharge status: Home / Self Care | Payer: Managed Care, Other (non HMO) | Source: Ambulatory Visit | Attending: Certified Nurse Midwife | Admitting: Certified Nurse Midwife

## 2023-05-01 VITALS — BP 127/89 | HR 97 | Wt 301.9 lb

## 2023-05-01 DIAGNOSIS — N898 Other specified noninflammatory disorders of vagina: Secondary | ICD-10-CM | POA: Diagnosis not present

## 2023-05-01 DIAGNOSIS — Z8759 Personal history of other complications of pregnancy, childbirth and the puerperium: Secondary | ICD-10-CM

## 2023-05-01 DIAGNOSIS — Z01419 Encounter for gynecological examination (general) (routine) without abnormal findings: Secondary | ICD-10-CM

## 2023-05-01 DIAGNOSIS — Z8632 Personal history of gestational diabetes: Secondary | ICD-10-CM

## 2023-05-01 DIAGNOSIS — E559 Vitamin D deficiency, unspecified: Secondary | ICD-10-CM

## 2023-05-02 ENCOUNTER — Encounter: Payer: Self-pay | Admitting: Certified Nurse Midwife

## 2023-05-02 LAB — LIPID PANEL
Chol/HDL Ratio: 4.2 ratio (ref 0.0–4.4)
Cholesterol, Total: 164 mg/dL (ref 100–199)
HDL: 39 mg/dL — ABNORMAL LOW (ref >39)
LDL Chol Calc (NIH): 93 mg/dL (ref 0–99)
Triglycerides: 188 mg/dL — ABNORMAL HIGH (ref 0–149)
VLDL Cholesterol Cal: 32 mg/dL (ref 5–40)

## 2023-05-02 LAB — COMPREHENSIVE METABOLIC PANEL
ALT: 8 IU/L (ref 0–32)
AST: 6 IU/L (ref 0–40)
Albumin: 4.2 g/dL (ref 3.9–4.9)
Alkaline Phosphatase: 50 IU/L (ref 44–121)
BUN/Creatinine Ratio: 15 (ref 9–23)
BUN: 11 mg/dL (ref 6–20)
Bilirubin Total: 0.2 mg/dL (ref 0.0–1.2)
CO2: 24 mmol/L (ref 20–29)
Calcium: 9.4 mg/dL (ref 8.7–10.2)
Chloride: 102 mmol/L (ref 96–106)
Creatinine, Ser: 0.72 mg/dL (ref 0.57–1.00)
Globulin, Total: 2.9 g/dL (ref 1.5–4.5)
Glucose: 120 mg/dL — ABNORMAL HIGH (ref 70–99)
Potassium: 4.1 mmol/L (ref 3.5–5.2)
Sodium: 140 mmol/L (ref 134–144)
Total Protein: 7.1 g/dL (ref 6.0–8.5)
eGFR: 115 mL/min/{1.73_m2} (ref >59)

## 2023-05-02 LAB — CBC
Hematocrit: 35 % (ref 34.0–46.6)
Hemoglobin: 11 g/dL — ABNORMAL LOW (ref 11.1–15.9)
MCH: 25.6 pg — ABNORMAL LOW (ref 26.6–33.0)
MCHC: 31.4 g/dL — ABNORMAL LOW (ref 31.5–35.7)
MCV: 81 fL (ref 79–97)
Platelets: 294 10*3/uL (ref 150–450)
RBC: 4.3 x10E6/uL (ref 3.77–5.28)
RDW: 14.3 % (ref 11.7–15.4)
WBC: 7.2 10*3/uL (ref 3.4–10.8)

## 2023-05-02 LAB — THYROID PANEL WITH TSH
Free Thyroxine Index: 2.5 (ref 1.2–4.9)
T3 Uptake Ratio: 29 % (ref 24–39)
T4, Total: 8.7 ug/dL (ref 4.5–12.0)
TSH: 1.94 u[IU]/mL (ref 0.450–4.500)

## 2023-05-02 LAB — VITAMIN D 25 HYDROXY (VIT D DEFICIENCY, FRACTURES): Vit D, 25-Hydroxy: 20.3 ng/mL — ABNORMAL LOW (ref 30.0–100.0)

## 2023-05-02 LAB — HEMOGLOBIN A1C
Est. average glucose Bld gHb Est-mCnc: 123 mg/dL
Hgb A1c MFr Bld: 5.9 % — ABNORMAL HIGH (ref 4.8–5.6)

## 2023-05-02 LAB — HEPATITIS C ANTIBODY: Hep C Virus Ab: NONREACTIVE

## 2023-05-02 LAB — HEPATITIS B SURFACE ANTIGEN: Hepatitis B Surface Ag: NEGATIVE

## 2023-05-02 LAB — HIV ANTIBODY (ROUTINE TESTING W REFLEX): HIV Screen 4th Generation wRfx: NONREACTIVE

## 2023-05-02 LAB — RPR: RPR Ser Ql: NONREACTIVE

## 2023-05-02 MED ORDER — VITAMIN D 125 MCG (5000 UT) PO CAPS
1.0000 | ORAL_CAPSULE | Freq: Every day | ORAL | 11 refills | Status: AC
Start: 2023-05-02 — End: ?

## 2023-05-02 NOTE — Progress Notes (Signed)
ANNUAL EXAM Patient name: Lauren [name] MRN [id number]  Date of birth: 1991-03-13 Chief Complaint:   Gynecologic Exam  History of Present Illness:   Lauren [name] is a 32 y.o. [phone] African-American female being seen today for a routine annual exam.  Current complaints: none, but did note she's been gaining weight and is often tired  Patient's last menstrual period was 04/11/2023 (approximate).  The pregnancy intention screening data noted above was reviewed. Potential methods of contraception were discussed. The patient elected to proceed with Vasectomy.   Last pap >57yrs. Results were: NILM w/ HRHPV negative. H/O abnormal pap: no Last mammogram: Never (age). Results were: N/A. Family h/o breast cancer: no Last colonoscopy: Never (age). Results were: N/A. Family h/o colorectal cancer: no     05/01/2023    1:49 PM  Depression screen PHQ 2/9  Decreased Interest 1  Down, Depressed, Hopeless 1  PHQ - 2 Score 2  Altered sleeping 2  Tired, decreased energy 1  Change in appetite 1  Feeling bad or failure about yourself  0  Trouble concentrating 0  Moving slowly or fidgety/restless 0  Suicidal thoughts 0  PHQ-9 Score 6      05/01/2023    1:50 PM  GAD 7 : Generalized Anxiety Score  Nervous, Anxious, on Edge 0  Control/stop worrying 1  Worry too much - different things 1  Trouble relaxing 1  Restless 0  Easily annoyed or irritable 1  Afraid - awful might happen 0  Total GAD 7 Score 4    Review of Systems:   Pertinent items are noted in HPI Denies any headaches, blurred vision, fatigue, shortness of breath, chest pain, abdominal pain, abnormal vaginal discharge/itching/odor/irritation, problems with periods, bowel movements, urination, or intercourse unless otherwise stated above. Pertinent History Reviewed:  Reviewed past medical,surgical, social and family history.  Reviewed problem list, medications and allergies. Physical Assessment:   Vitals:   05/01/23 1343  05/01/23 1406  BP: (!) 139/92 127/89  Pulse: 91 97  Weight: (!) 301 lb 14.4 oz (136.9 kg)    Body mass index is 48 kg/m.   Physical Examination:  General appearance - well appearing, and in no distress Mental status - alert, oriented to person, place, and time Psych:  She has a normal mood and affect Skin - warm and dry, normal color, no suspicious lesions noted Chest - effort normal, all lung fields clear to auscultation bilaterally Heart - normal rate and regular rhythm Neck:  midline trachea, no thyromegaly or nodules Breasts - breasts appear normal Abdomen - soft, nontender, nondistended, no masses or organomegaly Pelvic - VULVA: normal appearing vulva with no masses, tenderness or lesions  VAGINA: normal appearing vagina with normal color and discharge, no lesions  CERVIX: normal appearing cervix without discharge or lesions, no CMT Thin prep pap is done with HR HPV cotesting Extremities:  No swelling or varicosities noted  Chaperone present for exam  No results found for this or any previous visit (from the past 24 hour(s)).  Assessment & Plan:  1. Encounter for annual routine gynecological examination - Cytology - PAP( [facility]) - Hepatitis B Surface AntiGEN - Hepatitis C Antibody - HIV antibody (with reflex) - RPR - Lipid Profile - Thyroid Panel With TSH - Vitamin D (25 hydroxy)  2. History of gestational diabetes mellitus - Hemoglobin A1c  3. History of gestational hypertension - CBC - Comp Met (CMET)  4. Vaginal odor - Cervicovaginal ancillary only( [facility])  5. Vitamin  D deficiency - Cholecalciferol (VITAMIN D) 125 MCG (5000 UT) CAPS; Take 1 capsule by mouth daily after breakfast.  Dispense: 30 capsule; Refill: 11  Mammogram: @ 31yo, or sooner if problems Colonoscopy: @ 31yo, or sooner if problems  Orders Placed This Encounter  Procedures   Hepatitis B Surface AntiGEN   Hepatitis C Antibody   HIV antibody (with reflex)   RPR   Hemoglobin A1c    Lipid Profile   Thyroid Panel With TSH   Vitamin D (25 hydroxy)   CBC   Comp Met (CMET)   Meds:  Meds ordered this encounter  Medications   Cholecalciferol (VITAMIN D) 125 MCG (5000 UT) CAPS    Sig: Take 1 capsule by mouth daily after breakfast.    Dispense:  30 capsule    Refill:  11   Follow-up: Return in about 1 year (around 04/30/2024) for ANN.  Bernerd Limbo, CNM 05/02/2023 9:45 PM

## 2023-05-03 LAB — CERVICOVAGINAL ANCILLARY ONLY
Bacterial Vaginitis (gardnerella): NEGATIVE
Candida Glabrata: NEGATIVE
Candida Vaginitis: NEGATIVE
Chlamydia: NEGATIVE
Comment: NEGATIVE
Comment: NEGATIVE
Comment: NEGATIVE
Comment: NEGATIVE
Comment: NEGATIVE
Comment: NORMAL
Neisseria Gonorrhea: NEGATIVE
Trichomonas: NEGATIVE

## 2023-05-03 LAB — CYTOLOGY - PAP
Adequacy: ABSENT
Comment: NEGATIVE
Diagnosis: NEGATIVE
High risk HPV: NEGATIVE

## 2023-06-03 ENCOUNTER — Ambulatory Visit (INDEPENDENT_AMBULATORY_CARE_PROVIDER_SITE_OTHER): Payer: Managed Care, Other (non HMO) | Admitting: Obstetrics and Gynecology

## 2023-06-03 ENCOUNTER — Other Ambulatory Visit: Payer: Self-pay

## 2023-06-03 ENCOUNTER — Encounter: Payer: Self-pay | Admitting: Obstetrics and Gynecology

## 2023-06-03 VITALS — BP 138/84 | HR 78 | Wt 304.5 lb

## 2023-06-03 DIAGNOSIS — R102 Pelvic and perineal pain: Secondary | ICD-10-CM

## 2023-06-03 MED ORDER — CYCLOBENZAPRINE HCL 10 MG PO TABS
10.0000 mg | ORAL_TABLET | Freq: Two times a day (BID) | ORAL | 1 refills | Status: DC | PRN
Start: 2023-06-03 — End: 2023-08-19
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
## 2023-08-19 ENCOUNTER — Other Ambulatory Visit: Payer: Self-pay

## 2023-08-19 ENCOUNTER — Emergency Department (HOSPITAL_COMMUNITY)
Admission: EM | Admit: 2023-08-19 | Discharge: 2023-08-19 | Disposition: A | Discharge status: Home / Self Care | Payer: Managed Care, Other (non HMO) | Attending: Emergency Medicine | Admitting: Emergency Medicine

## 2023-08-19 ENCOUNTER — Encounter (HOSPITAL_COMMUNITY): Payer: Self-pay | Admitting: Emergency Medicine

## 2023-08-19 DIAGNOSIS — R350 Frequency of micturition: Secondary | ICD-10-CM | POA: Insufficient documentation

## 2023-08-19 DIAGNOSIS — N898 Other specified noninflammatory disorders of vagina: Secondary | ICD-10-CM | POA: Insufficient documentation

## 2023-08-19 DIAGNOSIS — N949 Unspecified condition associated with female genital organs and menstrual cycle: Secondary | ICD-10-CM

## 2023-08-19 LAB — URINALYSIS, W/ REFLEX TO CULTURE (INFECTION SUSPECTED)
Bilirubin Urine: NEGATIVE
Glucose, UA: NEGATIVE mg/dL
Hgb urine dipstick: NEGATIVE
Ketones, ur: NEGATIVE mg/dL
Leukocytes,Ua: NEGATIVE
Nitrite: NEGATIVE
Protein, ur: NEGATIVE mg/dL
Specific Gravity, Urine: 1.021 (ref 1.005–1.030)
pH: 6 (ref 5.0–8.0)

## 2023-08-19 LAB — WET PREP, GENITAL
Clue Cells Wet Prep HPF POC: NONE SEEN
Sperm: NONE SEEN
Trich, Wet Prep: NONE SEEN
WBC, Wet Prep HPF POC: <10 (ref <10)
Yeast Wet Prep HPF POC: NONE SEEN

## 2023-08-19 LAB — PREGNANCY, URINE: Preg Test, Ur: NEGATIVE

## 2023-08-19 MED ORDER — METHOCARBAMOL 1000 MG PO TABS: 1000.0000 mg | ORAL_TABLET | Freq: Three times a day (TID) | ORAL | 0 refills | Status: AC | PRN

## 2023-08-19 MED ORDER — METHOCARBAMOL 500 MG PO TABS
1000.0000 mg | ORAL_TABLET | Freq: Once | ORAL | Status: AC
  Administered 2023-08-19: 1000 mg via ORAL
  Filled 2023-08-19: qty 2

## 2023-08-19 NOTE — Discharge Instructions (Addendum)
We are prescribing a muscle relaxer called Robaxin.  Do not combine this with other medications, especially Flexeril, and do not combine with alcohol or drugs.  Do not drive or operate heavy machinery while on this medicine.  Otherwise, use the pelvic floor exercises provided.  Follow-up with your OB/GYN.  If you develop fever, new or worsening pain, inability to urinate, or any other new/concerning symptoms then return to the ER or call 911.

## 2023-08-19 NOTE — ED Provider Notes (Signed)
[place] EMERGENCY DEPARTMENT AT [facility] Provider Note   CSN: [id number] Arrival date & time: 08/19/23  8295     History  Chief Complaint  Patient presents with   Urinary Frequency    Lauren [name] is a 32 y.o. female.  HPI 32 year old female presents with a chief complaint of vaginal spasms.  She has had this issue on and off since May.  Last night she had a recurrence and it feels like she is having vaginal spasms and spasms near her buttocks.  Was really severe last night though did get better with some ibuprofen.  She has some muscle relaxer (Flexeril) leftover but that makes her sedated and she was going to go to work today so she did not take any.  She has not had a fever.  She has been having some urinary frequency and urgency though no dysuria.  No vaginal bleeding or discharge, last cycle was 1 week ago.  She has noticed that about a week after her cycle she will have the symptoms.  She has seen OB/GYN for this.  Right now the pain is about a 4 out of 10.  She has unprotected sex with her husband, no concern for STI.  Home Medications Prior to Admission medications   Medication Sig Start Date End Date Taking? Authorizing Provider  Cholecalciferol (VITAMIN D) 125 MCG (5000 UT) CAPS Take 1 capsule by mouth daily after breakfast. 05/02/23   Bernerd Limbo, CNM  cyclobenzaprine (FLEXERIL) 10 MG tablet Take 1 tablet (10 mg total) by mouth 2 (two) times daily as needed. Can take half a pill 06/03/23   Lorriane Shire, MD      Allergies    Bee venom and Other    Review of Systems   Review of Systems  Constitutional:  Negative for fever.  Gastrointestinal:  Negative for abdominal pain.  Genitourinary:  Positive for frequency and vaginal pain. Negative for dysuria, vaginal bleeding and vaginal discharge.    Physical Exam Updated Vital Signs BP (!) 152/108   Pulse 79   Temp 98.2 F (36.8 C)   Resp 18   LMP 08/12/2023 (Exact Date)   SpO2 100%   Physical Exam Vitals and nursing note reviewed. Exam conducted with a chaperone present.  Constitutional:      Appearance: She is well-developed. She is obese.  HENT:     Head: Normocephalic and atraumatic.  Pulmonary:     Effort: Pulmonary effort is normal.  Abdominal:     Palpations: Abdomen is soft.     Tenderness: There is no abdominal tenderness.  Genitourinary:    Labia:        Right: No rash or tenderness.        Left: No rash or tenderness.      Vagina: No signs of injury. Vaginal discharge (mild) present. No tenderness or bleeding.     Cervix: No cervical motion tenderness.  Skin:    General: Skin is warm and dry.  Neurological:     Mental Status: She is alert.     ED Results / Procedures / Treatments   Labs (all labs ordered are listed, but only abnormal results are displayed) Labs Reviewed  PREGNANCY, URINE  URINALYSIS, W/ REFLEX TO CULTURE (INFECTION SUSPECTED)  GC/CHLAMYDIA PROBE AMP ([place]) NOT AT [facility]    EKG None  Radiology No results found.  Procedures Procedures    Medications Ordered in ED Medications  methocarbamol (ROBAXIN) tablet 1,000 mg (has no administration  in time range)    ED Course/ Medical Decision Making/ A&P                                 Medical Decision Making Amount and/or Complexity of Data Reviewed External Data Reviewed: notes. Labs: ordered.    Details: Not pregnant, no UTI.  Wet prep unremarkable.  Risk Prescription drug management.   Patient is feeling better after some Robaxin in the emergency department.  She has some increased urination/frequency but no UTI on urine.  She feels like she is completely emptying her bladder and so I think obstruction/retention is pretty unlikely.  At this point, she is well-appearing and has been having this issue on and off.  She has already set up an outpatient appointment with her OB/GYN through MyChart.  Will give her Robaxin as a prescription and have her stop the  Flexeril.  Will recommend some Kegel exercises.  Otherwise, low concern for STI, PID, etc.  Will discharge home with return precautions.        Final Clinical Impression(s) / ED Diagnoses Final diagnoses:  None    Rx / DC Orders ED Discharge Orders     None         Pricilla Loveless, MD 08/19/23 [phone]

## 2023-08-19 NOTE — ED Triage Notes (Signed)
Pt here from home with Vaginal spasm that started last night , feels like cramps according to pt , no discharge or bleeding , pt is having urinary frequency

## 2023-08-20 LAB — GC/CHLAMYDIA PROBE AMP (~~LOC~~) NOT AT ARMC
Chlamydia: NEGATIVE
Comment: NEGATIVE
Comment: NORMAL
Neisseria Gonorrhea: NEGATIVE

## 2023-09-26 ENCOUNTER — Ambulatory Visit: Payer: Managed Care, Other (non HMO) | Admitting: Obstetrics and Gynecology

## 2023-09-27 ENCOUNTER — Encounter: Payer: Self-pay | Admitting: Obstetrics and Gynecology

## 2023-09-27 ENCOUNTER — Other Ambulatory Visit: Payer: Self-pay | Admitting: Obstetrics and Gynecology

## 2023-09-27 DIAGNOSIS — N939 Abnormal uterine and vaginal bleeding, unspecified: Secondary | ICD-10-CM

## 2023-09-27 DIAGNOSIS — N946 Dysmenorrhea, unspecified: Secondary | ICD-10-CM

## 2023-10-10 ENCOUNTER — Ambulatory Visit (HOSPITAL_COMMUNITY): Payer: Managed Care, Other (non HMO)

## 2023-10-10 ENCOUNTER — Ambulatory Visit (HOSPITAL_COMMUNITY)
Admission: RE | Admit: 2023-10-10 | Discharge: 2023-10-10 | Disposition: A | Discharge status: Home / Self Care | Payer: Managed Care, Other (non HMO) | Source: Ambulatory Visit | Attending: Obstetrics and Gynecology | Admitting: Obstetrics and Gynecology

## 2023-10-10 DIAGNOSIS — N939 Abnormal uterine and vaginal bleeding, unspecified: Secondary | ICD-10-CM | POA: Insufficient documentation

## 2023-10-10 DIAGNOSIS — N946 Dysmenorrhea, unspecified: Secondary | ICD-10-CM | POA: Diagnosis present

## 2023-11-08 ENCOUNTER — Ambulatory Visit: Payer: Managed Care, Other (non HMO) | Admitting: Family Medicine

## 2023-11-18 ENCOUNTER — Ambulatory Visit: Payer: Managed Care, Other (non HMO) | Admitting: Obstetrics and Gynecology

## 2023-11-18 ENCOUNTER — Other Ambulatory Visit (HOSPITAL_COMMUNITY)
Admission: RE | Admit: 2023-11-18 | Discharge: 2023-11-18 | Disposition: A | Discharge status: Home / Self Care | Payer: Managed Care, Other (non HMO) | Source: Ambulatory Visit | Attending: Obstetrics and Gynecology | Admitting: Obstetrics and Gynecology

## 2023-11-18 ENCOUNTER — Other Ambulatory Visit: Payer: Self-pay

## 2023-11-18 VITALS — BP 148/94 | HR 94 | Wt 317.3 lb

## 2023-11-18 DIAGNOSIS — N939 Abnormal uterine and vaginal bleeding, unspecified: Secondary | ICD-10-CM | POA: Insufficient documentation

## 2023-11-18 DIAGNOSIS — Z3202 Encounter for pregnancy test, result negative: Secondary | ICD-10-CM | POA: Diagnosis not present

## 2023-11-18 LAB — POCT PREGNANCY, URINE: Preg Test, Ur: NEGATIVE

## 2023-11-18 MED ORDER — TRANEXAMIC ACID 650 MG PO TABS: 1300.0000 mg | ORAL_TABLET | Freq: Three times a day (TID) | ORAL | 2 refills | Status: DC

## 2023-11-18 NOTE — Progress Notes (Signed)
GYNECOLOGY VISIT  Patient name: Lauren [name] MRN [id number]  Date of birth: [birth year] Chief Complaint:   No chief complaint on file.   History:  Lauren [name] is a 33 y.o. [id number] being seen today for AUB followup.  Even with taking a half - was going to another therapiest   Menses are heavy and having more blood clots. Prior gyn gave her pills for her menses. The pills would stop her period ; does not want to be on birth control any longer to manage her abnormal bleeding.   Partner has had a vasectomy, therefor has completed childbearing. Expresses interest in hysterectomy as definitive management of AUB.   Assists doctor with oral procedures in office and has concerns regarding postop recovery and restrictions.    Husband is trucker and will need to know surgical date well enough of ahead of time so that he can be available to help.   No prior cesearen setions/no other intraabdominal surgeries   She is currently in PT for her lower back - see's a PT outside of [facility]. Not sure if current PT does PFPT but will ask; does not want to have to take off more time for additional/separate PT if it can be helped  Past Medical History:  Diagnosis Date   Allergy    allergic rhinitis   Asthma    Gestational diabetes    with first pregnancy   Obesity    Pyelonephritis 06/2016    Past Surgical History:  Procedure Laterality Date   NO PAST SURGERIES     WISDOM TOOTH EXTRACTION      The following portions of the patient's history were reviewed and updated as appropriate: allergies, current medications, past family history, past medical history, past social history, past surgical history and problem list.   Health Maintenance:   Last pap     Component Value Date/Time   DIAGPAP  05/01/2023 1426    - Negative for intraepithelial lesion or malignancy (NILM)   HPVHIGH Negative 05/01/2023 1426   ADEQPAP  05/01/2023 1426    Satisfactory for evaluation; transformation zone  component ABSENT.    High Risk HPV: Positive  Adequacy:  Satisfactory for evaluation, transformation zone component PRESENT  Diagnosis:  Atypical squamous cells of undetermined significance (ASC-US)  Last mammogram: n/a   Review of Systems:  Pertinent items are noted in HPI. Comprehensive review of systems was otherwise negative.   Objective:  Physical Exam BP (!) 148/94   Pulse 94   Wt (!) 317 lb 4.8 oz (143.9 kg)   BMI 50.45 kg/m    Physical Exam Vitals and nursing note reviewed. Exam conducted with a chaperone present.  Constitutional:      Appearance: Normal appearance.  HENT:     Head: Normocephalic and atraumatic.  Pulmonary:     Effort: Pulmonary effort is normal.     Breath sounds: Normal breath sounds.  Genitourinary:    General: Normal vulva.     Exam position: Lithotomy position.     Vagina: Normal.     Cervix: Normal.  Skin:    General: Skin is warm and dry.  Neurological:     General: No focal deficit present.     Mental Status: She is alert.  Psychiatric:        Mood and Affect: Mood normal.        Behavior: Behavior normal.        Thought Content: Thought content normal.  Judgment: Judgment normal.    Endometrial Biopsy Procedure  Patient identified, informed consent performed,  indication reviewed, consent signed.  Reviewed risk of perforation, pain, bleeding, insufficient sample, etc were reviewd. Time out was performed.  Urine pregnancy test negative.  Speculum placed in the vagina.  Cervix visualized.  Cleaned with Betadine x 2.  Anterior cervix grasped anteriorly with a single tooth tenaculum.  Paracervical block was not administered.  Endometrial pipelle was used to draw up 1cc of 1% lidocaine, introduced into the cervical os and instilled into the endometrial cavity.  The pipelle was passed twice without difficulty and sample obtained. Tenaculum was removed, good hemostasis noted.  Patient tolerated procedure well.  Patient was given  post-procedure instructions.      Assessment & Plan:   1. Abnormal uterine bleeding (AUB) (Primary) Now s/p uncomplicated EMB.  Discussed management options for abnormal uterine bleeding including NSAIDs (Naproxen), tranexamic acid (Lysteda), oral progesterone, Depo Provera, Levonogestrel IUD, endometrial ablation or hysterectomy as definitive surgical management.  Discussed risks and benefits of each method.   Patient desires definitive surgical managemetn - tranexamic acid (LYSTEDA) 650 MG TABS tablet; Take 2 tablets (1,300 mg total) by mouth 3 (three) times daily. Take during menses for a maximum of five days  Dispense: 30 tablet; Refill: 2 - CBC - Surgical pathology   Patient desires surgical management with TLH,BS, cysto - ideally RA.  The risks of surgery were discussed in detail with the patient including but not limited to: bleeding which may require transfusion or reoperation; infection which may require prolonged hospitalization or re-hospitalization and antibiotic therapy; injury to bowel, bladder, ureters and major vessels or other surrounding organs which may lead to other procedures; formation of adhesions; need for additional procedures including laparotomy or subsequent procedures secondary to intraoperative injury or abnormal pathology; thromboembolic phenomenon; incisional problems and other postoperative or anesthesia complications.  Patient was told that the likelihood that her condition and symptoms will be treated effectively with this surgical management was high; the postoperative expectations were also discussed in detail. The patient also understands the alternative treatment options which were discussed in full. All questions were answered.  She was told that she will be contacted by our surgical scheduler regarding the time and date of her surgery; routine preoperative instructions will be given to her by the preoperative nursing team.    Printed patient education handouts  about the procedure were given to the patient to review at home.    Routine preventative health maintenance measures emphasized.  Lorriane Shire, MD [facility] for [facility], [facility]

## 2023-11-19 LAB — CBC
Hematocrit: 33 % — ABNORMAL LOW (ref 34.0–46.6)
Hemoglobin: 10.4 g/dL — ABNORMAL LOW (ref 11.1–15.9)
MCH: 25.9 pg — ABNORMAL LOW (ref 26.6–33.0)
MCHC: 31.5 g/dL (ref 31.5–35.7)
MCV: 82 fL (ref 79–97)
Platelets: 285 10*3/uL (ref 150–450)
RBC: 4.02 x10E6/uL (ref 3.77–5.28)
RDW: 14.7 % (ref 11.7–15.4)
WBC: 7.8 10*3/uL (ref 3.4–10.8)

## 2023-11-20 LAB — SURGICAL PATHOLOGY

## 2023-11-21 ENCOUNTER — Encounter: Payer: Self-pay | Admitting: Obstetrics and Gynecology

## 2023-11-21 ENCOUNTER — Telehealth: Payer: Self-pay | Admitting: Pharmacy Technician

## 2023-11-21 DIAGNOSIS — N939 Abnormal uterine and vaginal bleeding, unspecified: Secondary | ICD-10-CM | POA: Insufficient documentation

## 2023-11-21 NOTE — Telephone Encounter (Signed)
Auth Submission: NO AUTH NEEDED Site of care: Site of care: [facility] Payer: [employer] Medication & CPT/J Code(s) submitted: Venofer (Iron Sucrose) J1756 Route of submission (phone, fax, portal):  Phone # Fax # Auth type: Buy/Bill PB Units/visits requested: 2 Reference number:  Approval from: 11/21/23 to 04/20/24

## 2023-11-27 DIAGNOSIS — Z0289 Encounter for other administrative examinations: Secondary | ICD-10-CM

## 2023-11-29 ENCOUNTER — Ambulatory Visit (INDEPENDENT_AMBULATORY_CARE_PROVIDER_SITE_OTHER): Payer: Managed Care, Other (non HMO)

## 2023-11-29 VITALS — BP 153/102 | HR 84 | Temp 99.2°F | Resp 18 | Ht 66.0 in | Wt 314.4 lb

## 2023-11-29 DIAGNOSIS — N939 Abnormal uterine and vaginal bleeding, unspecified: Secondary | ICD-10-CM | POA: Diagnosis not present

## 2023-11-29 DIAGNOSIS — D500 Iron deficiency anemia secondary to blood loss (chronic): Secondary | ICD-10-CM

## 2023-11-29 MED ORDER — IRON SUCROSE 20 MG/ML IV SOLN
200.0000 mg | Freq: Once | INTRAVENOUS | Status: AC
  Administered 2023-11-29: 200 mg via INTRAVENOUS
  Filled 2023-11-29: qty 10

## 2023-11-29 MED ORDER — ACETAMINOPHEN 325 MG PO TABS
650.0000 mg | ORAL_TABLET | Freq: Once | ORAL | Status: AC
  Administered 2023-11-29: 650 mg via ORAL
  Filled 2023-11-29: qty 2

## 2023-11-29 MED ORDER — SODIUM CHLORIDE 0.9 % IV BOLUS
250.0000 mL | Freq: Once | INTRAVENOUS | Status: DC
  Filled 2023-11-29: qty 250

## 2023-11-29 MED ORDER — DIPHENHYDRAMINE HCL 25 MG PO CAPS
25.0000 mg | ORAL_CAPSULE | Freq: Once | ORAL | Status: AC
  Administered 2023-11-29: 25 mg via ORAL
  Filled 2023-11-29: qty 1

## 2023-11-29 NOTE — Progress Notes (Signed)
 Diagnosis: Iron  Deficiency Anemia  Provider:  Praveen Mannam MD  Procedure: IV Push  IV Type: Peripheral, IV Location: L Antecubital  Venofer  (Iron  Sucrose), Dose: 200 mg  Post Infusion IV Care: Observation period completed and Peripheral IV Discontinued  Discharge: Condition: Good, Destination: Home . AVS Declined  Performed by:  Leita FORBES Miles, LPN

## 2023-12-06 ENCOUNTER — Ambulatory Visit: Payer: Managed Care, Other (non HMO)

## 2023-12-06 VITALS — BP 155/91 | HR 68 | Temp 97.9°F | Resp 18 | Ht 66.0 in | Wt 314.4 lb

## 2023-12-06 DIAGNOSIS — N939 Abnormal uterine and vaginal bleeding, unspecified: Secondary | ICD-10-CM

## 2023-12-06 DIAGNOSIS — D509 Iron deficiency anemia, unspecified: Secondary | ICD-10-CM | POA: Diagnosis not present

## 2023-12-06 MED ORDER — IRON SUCROSE 20 MG/ML IV SOLN
200.0000 mg | Freq: Once | INTRAVENOUS | Status: AC
Start: 2023-12-06 — End: 2023-12-06
  Administered 2023-12-06: 200 mg via INTRAVENOUS
  Filled 2023-12-06: qty 10

## 2023-12-06 MED ORDER — ACETAMINOPHEN 325 MG PO TABS
650.0000 mg | ORAL_TABLET | Freq: Once | ORAL | Status: AC
  Administered 2023-12-06: 650 mg via ORAL
  Filled 2023-12-06: qty 2

## 2023-12-06 MED ORDER — DIPHENHYDRAMINE HCL 25 MG PO CAPS
25.0000 mg | ORAL_CAPSULE | Freq: Once | ORAL | Status: AC
Start: 2023-12-06 — End: 2023-12-06
  Administered 2023-12-06: 25 mg via ORAL
  Filled 2023-12-06: qty 1

## 2023-12-06 NOTE — Progress Notes (Signed)
Diagnosis: Iron Deficiency Anemia  Provider:  Chilton Greathouse MD  Procedure: IV Push  IV Type: Peripheral, IV Location: L Antecubital  Venofer (Iron Sucrose), Dose: 200 mg  Post Infusion IV Care: Observation period completed and Peripheral IV Discontinued  Discharge: Condition: Good, Destination: Home . AVS Declined  Performed by:  Loney Hering, LPN

## 2023-12-12 ENCOUNTER — Encounter: Payer: Self-pay | Admitting: General Practice

## 2023-12-27 ENCOUNTER — Telehealth: Payer: Self-pay

## 2023-12-27 NOTE — Telephone Encounter (Signed)
 Patient reached out to her doctor's office requesting a surgery date w/ Dr. Briscoe Deutscher. I called the patient and left a voicemail asking if she signed the Medicaid consent form? I am unable to find  copy in Epic. I advised her surgery can not be scheduled until the form is completed and on file due to her insurance requirements.

## 2023-12-30 ENCOUNTER — Telehealth: Payer: Self-pay

## 2023-12-30 NOTE — Telephone Encounter (Signed)
 Patient called me back to confirm she signed the Medicaid consent form during her last visit w/ Dr. Briscoe Deutscher.

## 2023-12-31 ENCOUNTER — Telehealth: Payer: Self-pay

## 2023-12-31 NOTE — Telephone Encounter (Signed)
 Patient called front office to regarding phone call she received today by RN. Informed patient that her Hysterectomy form needs to be fill out in our office if she can come by to sign form. Patient stated she will come by tomorrow during lunch to sign hysterectomy form.   Marcelino Duster, RN

## 2023-12-31 NOTE — Telephone Encounter (Signed)
 Attempted to call patient regarding hysterectomy statement form. Left VM to call our office back.  Will try to call patient again.  Marcelino Duster, RN

## 2023-12-31 NOTE — Telephone Encounter (Signed)
 Attempted to call patient. Left VM to call our office back   [place], [place]

## 2024-01-01 NOTE — Telephone Encounter (Signed)
 Patient has come in to sign hysterectomy statement.  Lauren [name]

## 2024-01-01 NOTE — Progress Notes (Signed)
 Patient came in office [facility] to sign Hysterectomy statement form.    Marcelino Duster, RN

## 2024-01-08 ENCOUNTER — Other Ambulatory Visit: Payer: Self-pay

## 2024-01-08 ENCOUNTER — Ambulatory Visit

## 2024-01-08 ENCOUNTER — Ambulatory Visit
Admission: EM | Admit: 2024-01-08 | Discharge: 2024-01-08 | Disposition: A | Discharge status: Home / Self Care | Attending: Internal Medicine | Admitting: Internal Medicine

## 2024-01-08 DIAGNOSIS — R6889 Other general symptoms and signs: Secondary | ICD-10-CM

## 2024-01-08 LAB — POC COVID19/FLU A&B COMBO
Covid Antigen, POC: NEGATIVE
Influenza A Antigen, POC: NEGATIVE

## 2024-01-08 LAB — POCT RAPID STREP A (OFFICE): Rapid Strep A Screen: NEGATIVE

## 2024-01-08 MED ORDER — BENZONATATE 200 MG PO CAPS: 200.0000 mg | ORAL_CAPSULE | Freq: Three times a day (TID) | ORAL | 0 refills | Status: DC | PRN

## 2024-01-08 NOTE — ED Provider Notes (Signed)
 [facility]    CSN: [id number] Arrival date & time: 01/08/24  1611      History   Chief Complaint No chief complaint on file.   HPI Lauren [name] is a 33 y.o. female who presents with onset of ST, rhinitis, sneezing, stuffy nose, chills and cough x 2 days. Has been taking OTC meds but is not helping  Has been feeling hot. Her coworkers have flu and pneumonia.   Past Medical History:  Diagnosis Date   Allergy    allergic rhinitis   Asthma    Gestational diabetes    with first pregnancy   Obesity    Pyelonephritis 06/2016    Patient Active Problem List   Diagnosis Date Noted   Abnormal uterine bleeding (AUB) 11/21/2023   History of gestational diabetes mellitus 04/30/2023   RhD negative 04/30/2023   History of gestational hypertension 04/30/2023   Genital herpes simplex 09/04/2021   BMI 40.0-44.9, adult (HCC) 07/31/2013    Past Surgical History:  Procedure Laterality Date   NO PAST SURGERIES     WISDOM TOOTH EXTRACTION      OB History     Gravida  3   Para  2   Term  2   Preterm      AB  1   Living  2      SAB      IAB  1   Ectopic      Multiple  0   Live Births  2            Home Medications    Prior to Admission medications   Medication Sig Start Date End Date Taking? Authorizing Provider  benzonatate (TESSALON) 200 MG capsule Take 1 capsule (200 mg total) by mouth 3 (three) times daily as needed for cough. 01/08/24  Yes Rodriguez-Southworth, Nettie Elm, PA-C  Cholecalciferol (VITAMIN D) 125 MCG (5000 UT) CAPS Take 1 capsule by mouth daily after breakfast. 05/02/23   Bernerd Limbo, CNM  methocarbamol 1000 MG TABS Take 1,000 mg by mouth every 8 (eight) hours as needed for muscle spasms. 08/19/23   Pricilla Loveless, MD  tranexamic acid (LYSTEDA) 650 MG TABS tablet Take 2 tablets (1,300 mg total) by mouth 3 (three) times daily. Take during menses for a maximum of five days 11/18/23   Lorriane Shire, MD    Family  History Family History  Problem Relation Age of Onset   Hypertension Mother    Diabetes Father    Cancer Other    Asthma Other     Social History Social History   Tobacco Use   Smoking status: Never   Smokeless tobacco: Never  Vaping Use   Vaping status: Never Used  Substance Use Topics   Alcohol use: No   Drug use: No     Allergies   Bee venom and Other   Review of Systems Review of Systems As noted in HPI  Physical Exam Triage Vital Signs ED Triage Vitals  Encounter Vitals Group     BP 01/08/24 1623 (!) 148/99     Systolic BP Percentile --      Diastolic BP Percentile --      Pulse Rate 01/08/24 1623 98     Resp --      Temp 01/08/24 1623 99.7 F (37.6 C)     Temp Source 01/08/24 1623 Oral     SpO2 01/08/24 1623 99 %     Weight 01/08/24 1622 (!) 305 lb (138.3  kg)     Height 01/08/24 1622 5\' 6"  (1.676 m)     Head Circumference --      Peak Flow --      Pain Score 01/08/24 1621 6     Pain Loc --      Pain Education --      Exclude from Growth Chart --    No data found.  Updated Vital Signs BP (!) 148/99 (BP Location: Left Arm)   Pulse 98   Temp 99.7 F (37.6 C) (Oral)   Ht 5\' 6"  (1.676 m)   Wt (!) 305 lb (138.3 kg)   LMP 12/25/2023 (Approximate)   SpO2 99%   BMI 49.23 kg/m   Visual Acuity Right Eye Distance:   Left Eye Distance:   Bilateral Distance:    Right Eye Near:   Left Eye Near:    Bilateral Near:     Physical Exam Physical Exam Vitals signs and nursing note reviewed.  Constitutional:      General: She is not in acute distress.    Appearance: Normal appearance. She is not ill-appearing, toxic-appearing or diaphoretic.  HENT:     Head: Normocephalic.     Right Ear: Tympanic membrane, ear canal and external ear normal.     Left Ear: Tympanic membrane, ear canal and external ear normal.     Nose: with mucosa congestion and clear mucous     Mouth/Throat: slightly red with no exudate     Mouth: Mucous membranes are moist.   Eyes:     General: No scleral icterus.       Right eye: No discharge.        Left eye: No discharge.     Conjunctiva/sclera: Conjunctivae normal.  Neck:     Musculoskeletal: Neck supple. No neck rigidity.  Cardiovascular:     Rate and Rhythm: Normal rate and regular rhythm.     Heart sounds: No murmur.  Pulmonary:     Effort: Pulmonary effort is normal.     Breath sounds: Normal breath sounds.  Musculoskeletal: Normal range of motion.  Lymphadenopathy:     Cervical: No cervical adenopathy.  Skin:    General: Skin is warm and dry.     Coloration: Skin is not jaundiced.     Findings: No rash.  Neurological:     Mental Status: She is alert and oriented to person, place, and time.     Gait: Gait normal.  Psychiatric:        Mood and Affect: Mood normal.        Behavior: Behavior normal.        Thought Content: Thought content normal.        Judgment: Judgment normal.    UC Treatments / Results  Labs (all labs ordered are listed, but only abnormal results are displayed) Labs Reviewed  POCT RAPID STREP A (OFFICE) - Normal  POC COVID19/FLU A&B COMBO - Normal   Rapid strep, covid and flu tests are negative EKG   Radiology No results found.  Procedures Procedures (including critical care time)  Medications Ordered in UC Medications - No data to display  Initial Impression / Assessment and Plan / UC Course  I have reviewed the triage vital signs and the nursing notes.  Pertinent labs  results that were available during my care of the patient were reviewed by me and considered in my medical decision making (see chart for details).  Flu like symptoms  I placed her on Tessalon as  noted   Final Clinical Impressions(s) / UC Diagnoses   Final diagnoses:  Flu-like symptoms     Discharge Instructions      Your covid, flu and strep test are negative. You may have some other type of virus causing your current symptoms You dont have typical symptoms of pneumonia  like low oxygen and congestion in your lungs. If you get worse in the next 3 days please make sure to be seen again.      ED Prescriptions     Medication Sig Dispense Auth. Provider   benzonatate (TESSALON) 200 MG capsule Take 1 capsule (200 mg total) by mouth 3 (three) times daily as needed for cough. 30 capsule Rodriguez-Southworth, Nettie Elm, PA-C      PDMP not reviewed this encounter.   Garey Ham, [place] 01/08/24 1717

## 2024-01-08 NOTE — ED Triage Notes (Signed)
 Patient presents with chills, cough, sore throat, sneezing, runny nose and congestion x day 2. Treated Aleve cold & sinus D with minimal relief.

## 2024-01-08 NOTE — Discharge Instructions (Signed)
 Your covid, flu and strep test are negative. You may have some other type of virus causing your current symptoms You dont have typical symptoms of pneumonia like low oxygen and congestion in your lungs. If you get worse in the next 3 days please make sure to be seen again.

## 2024-03-02 ENCOUNTER — Ambulatory Visit (INDEPENDENT_AMBULATORY_CARE_PROVIDER_SITE_OTHER): Admitting: Family Medicine

## 2024-03-02 ENCOUNTER — Encounter: Payer: Self-pay | Admitting: Family Medicine

## 2024-03-02 VITALS — BP 155/98 | HR 85 | Ht 66.0 in | Wt 319.5 lb

## 2024-03-02 DIAGNOSIS — Z0000 Encounter for general adult medical examination without abnormal findings: Secondary | ICD-10-CM | POA: Diagnosis not present

## 2024-03-02 DIAGNOSIS — I10 Essential (primary) hypertension: Secondary | ICD-10-CM | POA: Insufficient documentation

## 2024-03-02 DIAGNOSIS — Z0001 Encounter for general adult medical examination with abnormal findings: Secondary | ICD-10-CM

## 2024-03-02 DIAGNOSIS — R079 Chest pain, unspecified: Secondary | ICD-10-CM | POA: Insufficient documentation

## 2024-03-02 DIAGNOSIS — F4321 Adjustment disorder with depressed mood: Secondary | ICD-10-CM | POA: Diagnosis not present

## 2024-03-02 DIAGNOSIS — Z8632 Personal history of gestational diabetes: Secondary | ICD-10-CM | POA: Insufficient documentation

## 2024-03-02 MED ORDER — HYDROCHLOROTHIAZIDE 12.5 MG PO TABS: 12.5000 mg | ORAL_TABLET | Freq: Every day | ORAL | 3 refills | Status: DC

## 2024-03-02 MED ORDER — AMLODIPINE BESYLATE 5 MG PO TABS: 5.0000 mg | ORAL_TABLET | Freq: Every day | ORAL | 1 refills | Status: DC

## 2024-03-02 NOTE — Assessment & Plan Note (Signed)

## 2024-03-02 NOTE — Assessment & Plan Note (Signed)
 Surrounding recent stressors. Denies SI/HI. Would like to see a Veterinary surgeon.

## 2024-03-02 NOTE — Assessment & Plan Note (Signed)
 Counseled on importance of weight management for overall health. Encouraged low calorie, heart healthy diet and moderate intensity exercise 150 minutes weekly. This is 3-5 times weekly for 30-50 minutes each session. Goal should be pace of 3 miles/hours, or walking 1.5 miles in 30 minutes and include strength training. Discussed risks of obesity.

## 2024-03-02 NOTE — Patient Instructions (Signed)
 It was great to meet you today and I'm excited to have you join the [facility] practice. I hope you had a positive experience today! If you feel so inclined, please feel free to recommend our practice to friends and family. Kurtis Bushman, FNP-C

## 2024-03-02 NOTE — Assessment & Plan Note (Signed)
 No current chest pain however she reports this does occur several days a week without precipitating events. EKG showed NSR. Referral to cardiology and discussed when to seek emergent medical care.

## 2024-03-02 NOTE — Assessment & Plan Note (Signed)
 Chronic unmedicated. Will start hydrochlorothiazide 12.5mg  daily. Encouraged to monitor BP at home and record readings. CMP, lipids, CBC, A1c, TSH today. Referral to cardiology in setting of chest pain several times a week.Recommend heart healthy diet such as Mediterranean diet with whole grains, fruits, vegetable, fish, lean meats, nuts, and olive oil. Limit salt. Encouraged moderate walking, 3-5 times/week for 30-50 minutes each session. Aim for at least 150 minutes.week. Goal should be pace of 3 miles/hours, or walking 1.5 miles in 30 minutes. Avoid tobacco products. Avoid excess alcohol. Take medications as prescribed and bring medications and blood pressure log with cuff to each office visit. Seek medical care for chest pain, palpitations, shortness of breath with exertion, dizziness/lightheadedness, vision changes, recurrent headaches, or swelling of extremities. Follow up in 2 weeks or sooner if needed.

## 2024-03-02 NOTE — Progress Notes (Signed)
 New Patient Office Visit  Subjective    Patient ID: Lauren [name], female    DOB: 12-09-1990  Age: 33 y.o. MRN: [id number]  CC:  Chief Complaint  Patient presents with   Establish Care   Anxiety   Hypertension    HPI Lauren [name] presents to establish care. Oriented to practice routines and expectations. Has not been seeing PCP, does go to OBGYN. PMH includes elevated BP, asthma, allergies, gestational diabetes. Has not been medicated for her blood pressure. Asthma is well controlled on Albuterol  PRN. She reports she has been having substernal chest pain twice a week over last 4 months, it is intermittent, no precipitating factors but does report she has been stressed over her upcoming hysterectomy. The pain is self-limited and lasts a few minutes. Described as throbbing and tightness in the center of her chest and is associated with shortness of breath. She also endorses lower extremity swelling at times, not currently. Denies palpitations, recurrent headaches, vision changes, lightheadedness, dizziness. No history of GERD.    Health Maintenance  Topic Date Due   Pneumococcal Vaccine 20-41 Years old (1 of 2 - PCV) Never done   DTaP/Tdap/Td (3 - Td or Tdap) 07/08/2023   COVID-19 Vaccine (1 - 2024-25 season) 03/18/2024 (Originally 06/23/2023)   INFLUENZA VACCINE  05/22/2024   Cervical Cancer Screening (HPV/Pap Cotest)  04/30/2028   Hepatitis C Screening  Completed   HIV Screening  Completed   HPV VACCINES  Aged Out   Meningococcal B Vaccine  Aged Out      Outpatient Encounter Medications as of 03/02/2024  Medication Sig   azelastine  (ASTELIN ) 0.1 % nasal spray    Cholecalciferol (VITAMIN D ) 125 MCG (5000 UT) CAPS Take 1 capsule by mouth daily after breakfast.   Cholecalciferol 1.25 MG (50000 UT) capsule TAKE 1 CAPSULE BY MOUTH ONE TIME PER WEEK   hydrochlorothiazide (HYDRODIURIL) 12.5 MG tablet Take 1 tablet (12.5 mg total) by mouth daily.   methocarbamol  1000 MG TABS  Take 1,000 mg by mouth every 8 (eight) hours as needed for muscle spasms.   tranexamic acid  (LYSTEDA ) 650 MG TABS tablet Take 2 tablets (1,300 mg total) by mouth 3 (three) times daily. Take during menses for a maximum of five days   tranexamic acid  (LYSTEDA ) 650 MG TABS tablet TAKE 2 TABLET(S) EVERY 8 HOURS BY MOUTH AS NEEDED.   valACYclovir (VALTREX) 500 MG tablet TAKE ONE TAB TWICE A DAY FOR 3 DAYS THEN ONE A DAY FOR SUPPRESSION   [DISCONTINUED] amLODipine (NORVASC) 5 MG tablet Take 1 tablet (5 mg total) by mouth daily.   [DISCONTINUED] benzonatate  (TESSALON ) 200 MG capsule Take 1 capsule (200 mg total) by mouth 3 (three) times daily as needed for cough.   [DISCONTINUED] tinidazole (TINDAMAX) 500 MG tablet TAKE 4 TABLETS BY MOUTH EVERY DAY FOR 2 DAYS   No facility-administered encounter medications on file as of 03/02/2024.    Past Medical History:  Diagnosis Date   Allergy    allergic rhinitis   Asthma    Gestational diabetes    with first pregnancy   Obesity    Pyelonephritis 06/2016    Past Surgical History:  Procedure Laterality Date   NO PAST SURGERIES     WISDOM TOOTH EXTRACTION      Family History  Problem Relation Age of Onset   Hypertension Mother    Diabetes Father    Cancer Other    Asthma Other     Social History  Socioeconomic History   Marital status: Married    Spouse name: Not on file   Number of children: Not on file   Years of education: Not on file   Highest education level: Not on file  Occupational History   Not on file  Tobacco Use   Smoking status: Never   Smokeless tobacco: Never  Vaping Use   Vaping status: Never Used  Substance and Sexual Activity   Alcohol use: No   Drug use: No   Sexual activity: Yes    Birth control/protection: None  Other Topics Concern   Not on file  Social History Narrative   Not on file   Social Drivers of Health   Financial Resource Strain: Not on file  Food Insecurity: Food Insecurity Present  (05/01/2023)   Hunger Vital Sign    Worried About Running Out of Food in the Last Year: Sometimes true    Ran Out of Food in the Last Year: Sometimes true  Transportation Needs: No Transportation Needs (05/01/2023)   PRAPARE - Administrator, Civil Service (Medical): No    Lack of Transportation (Non-Medical): No  Physical Activity: Not on file  Stress: Not on file  Social Connections: Not on file  Intimate Partner Violence: Not on file    Review of Systems  Constitutional: Negative.   HENT: Negative.    Eyes: Negative.   Respiratory: Negative.    Cardiovascular:  Positive for chest pain and leg swelling.  Gastrointestinal: Negative.   Genitourinary: Negative.   Musculoskeletal:  Positive for back pain.  Skin: Negative.   Neurological: Negative.   Endo/Heme/Allergies: Negative.   Psychiatric/Behavioral: Negative.    All other systems reviewed and are negative.       Objective    BP (!) 155/98   Pulse 85   Ht 5\' 6"  (1.676 m)   Wt (!) 319 lb 8 oz (144.9 kg)   SpO2 93%   BMI 51.57 kg/m     03/02/2024    2:55 PM 01/08/2024    4:23 PM 01/08/2024    4:22 PM  Vitals with BMI  Height 5\' 6"   5\' 6"   Weight 319 lbs 8 oz  305 lbs  BMI 51.59  49.25  Systolic 155 148   Diastolic 98 99   Pulse 85 98      Physical Exam Vitals and nursing note reviewed.  Constitutional:      Appearance: Normal appearance. She is obese.  HENT:     Head: Normocephalic and atraumatic.     Right Ear: Tympanic membrane, ear canal and external ear normal.     Left Ear: Tympanic membrane, ear canal and external ear normal.     Nose: Nose normal.     Mouth/Throat:     Mouth: Mucous membranes are moist.     Pharynx: Oropharynx is clear.  Eyes:     Extraocular Movements: Extraocular movements intact.     Conjunctiva/sclera: Conjunctivae normal.     Pupils: Pupils are equal, round, and reactive to light.  Cardiovascular:     Rate and Rhythm: Normal rate and regular rhythm.      Pulses: Normal pulses.     Heart sounds: Normal heart sounds.  Pulmonary:     Effort: Pulmonary effort is normal.     Breath sounds: Normal breath sounds.  Abdominal:     General: Bowel sounds are normal.     Palpations: Abdomen is soft.  Musculoskeletal:  General: Normal range of motion.     Cervical back: Normal range of motion and neck supple.  Skin:    General: Skin is warm and dry.     Capillary Refill: Capillary refill takes less than 2 seconds.  Neurological:     General: No focal deficit present.     Mental Status: She is alert and oriented to person, place, and time. Mental status is at baseline.  Psychiatric:        Mood and Affect: Mood normal.        Behavior: Behavior normal.        Thought Content: Thought content normal.        Judgment: Judgment normal.         Assessment & Plan:   Problem List Items Addressed This Visit     Hypertension   Chronic unmedicated. Will start hydrochlorothiazide 12.5mg  daily. Encouraged to monitor BP at home and record readings. CMP, lipids, CBC, A1c, TSH today. Referral to cardiology in setting of chest pain several times a week.Recommend heart healthy diet such as Mediterranean diet with whole grains, fruits, vegetable, fish, lean meats, nuts, and olive oil. Limit salt. Encouraged moderate walking, 3-5 times/week for 30-50 minutes each session. Aim for at least 150 minutes.week. Goal should be pace of 3 miles/hours, or walking 1.5 miles in 30 minutes. Avoid tobacco products. Avoid excess alcohol. Take medications as prescribed and bring medications and blood pressure log with cuff to each office visit. Seek medical care for chest pain, palpitations, shortness of breath with exertion, dizziness/lightheadedness, vision changes, recurrent headaches, or swelling of extremities. Follow up in 2 weeks or sooner if needed.      Relevant Medications   tranexamic acid  (LYSTEDA ) 650 MG TABS tablet   hydrochlorothiazide (HYDRODIURIL)  12.5 MG tablet   Other Relevant Orders   CBC with Differential/Platelet   Comprehensive metabolic panel with GFR   Lipid panel   TSH   Hemoglobin A1c   Ambulatory referral to Cardiology   Situational depression   Surrounding recent stressors. Denies SI/HI. Would like to see a Veterinary surgeon.      Relevant Orders   Ambulatory referral to Psychiatry   Chest pain   No current chest pain however she reports this does occur several days a week without precipitating events. EKG showed NSR. Referral to cardiology and discussed when to seek emergent medical care.       Relevant Orders   Ambulatory referral to Cardiology   Morbid obesity (HCC)   Counseled on importance of weight management for overall health. Encouraged low calorie, heart healthy diet and moderate intensity exercise 150 minutes weekly. This is 3-5 times weekly for 30-50 minutes each session. Goal should be pace of 3 miles/hours, or walking 1.5 miles in 30 minutes and include strength training. Discussed risks of obesity.      Relevant Orders   CBC with Differential/Platelet   Comprehensive metabolic panel with GFR   Lipid panel   TSH   Hemoglobin A1c   Physical exam, annual - Primary   Today your medical history was reviewed and routine physical exam with labs was performed. Recommend 150 minutes of moderate intensity exercise weekly and consuming a well-balanced diet. Advised to stop smoking if a smoker, avoid smoking if a non-smoker, limit alcohol consumption to 1 drink per day for women and 2 drinks per day for men, and avoid illicit drug use.  Counseled in mental health awareness and when to seek medical care. Vaccine maintenance discussed. Appropriate  health maintenance items reviewed. Return to office in 1 year for annual physical exam.        Return in about 2 weeks (around 03/16/2024) for hypertension.   Jenelle Mis, FNP

## 2024-03-03 ENCOUNTER — Ambulatory Visit: Payer: Self-pay | Admitting: Family Medicine

## 2024-03-03 ENCOUNTER — Encounter (HOSPITAL_COMMUNITY): Payer: Self-pay | Admitting: Obstetrics and Gynecology

## 2024-03-03 LAB — HEMOGLOBIN A1C
Hgb A1c MFr Bld: 6.1 % — ABNORMAL HIGH (ref <5.7)
Mean Plasma Glucose: 128 mg/dL
eAG (mmol/L): 7.1 mmol/L

## 2024-03-03 LAB — COMPREHENSIVE METABOLIC PANEL WITH GFR
AG Ratio: 1.6 (calc) (ref 1.0–2.5)
ALT: 6 U/L (ref 6–29)
AST: 5 U/L — ABNORMAL LOW (ref 10–30)
Albumin: 4.1 g/dL (ref 3.6–5.1)
Alkaline phosphatase (APISO): 38 U/L (ref 31–125)
BUN: 10 mg/dL (ref 7–25)
CO2: 22 mmol/L (ref 20–32)
Calcium: 9 mg/dL (ref 8.6–10.2)
Chloride: 102 mmol/L (ref 98–110)
Creat: 0.64 mg/dL (ref 0.50–0.97)
Globulin: 2.6 g/dL (ref 1.9–3.7)
Glucose, Bld: 119 mg/dL — ABNORMAL HIGH (ref 65–99)
Potassium: 4.1 mmol/L (ref 3.5–5.3)
Sodium: 138 mmol/L (ref 135–146)
Total Bilirubin: 0.3 mg/dL (ref 0.2–1.2)
Total Protein: 6.7 g/dL (ref 6.1–8.1)
eGFR: 120 mL/min/{1.73_m2} (ref >=60)

## 2024-03-03 LAB — LIPID PANEL
Cholesterol: 135 mg/dL (ref <200)
HDL: 36 mg/dL — ABNORMAL LOW (ref >=50)
LDL Cholesterol (Calc): 75 mg/dL
Non-HDL Cholesterol (Calc): 99 mg/dL (ref <130)
Total CHOL/HDL Ratio: 3.8 (calc) (ref <5.0)
Triglycerides: 159 mg/dL — ABNORMAL HIGH (ref <150)

## 2024-03-03 LAB — CBC WITH DIFFERENTIAL/PLATELET
Absolute Lymphocytes: 2638 {cells}/uL (ref 850–3900)
Absolute Monocytes: 530 {cells}/uL (ref 200–950)
Basophils Absolute: 20 {cells}/uL (ref 0–200)
Basophils Relative: 0.3 %
Eosinophils Absolute: 190 {cells}/uL (ref 15–500)
Eosinophils Relative: 2.8 %
HCT: 32.8 % — ABNORMAL LOW (ref 35.0–45.0)
Hemoglobin: 10.6 g/dL — ABNORMAL LOW (ref 11.7–15.5)
MCH: 26.8 pg — ABNORMAL LOW (ref 27.0–33.0)
MCHC: 32.3 g/dL (ref 32.0–36.0)
MCV: 83 fL (ref 80.0–100.0)
MPV: 11 fL (ref 7.5–12.5)
Monocytes Relative: 7.8 %
Neutro Abs: 3420 {cells}/uL (ref 1500–7800)
Neutrophils Relative %: 50.3 %
Platelets: 286 10*3/uL (ref 140–400)
RBC: 3.95 10*6/uL (ref 3.80–5.10)
RDW: 14.4 % (ref 11.0–15.0)
Total Lymphocyte: 38.8 %
WBC: 6.8 10*3/uL (ref 3.8–10.8)

## 2024-03-03 LAB — TSH: TSH: 1.91 m[IU]/L

## 2024-03-05 ENCOUNTER — Encounter: Payer: Self-pay | Admitting: Family Medicine

## 2024-03-05 ENCOUNTER — Encounter (HOSPITAL_COMMUNITY): Payer: Self-pay | Admitting: Obstetrics and Gynecology

## 2024-03-05 NOTE — Progress Notes (Signed)
 Spoke w/ via phone for pre-op interview--- pt Lab needs dos----  urine preg       Lab results------ lab appt 03-11-2024 @ 1300 getting T&S/ EKG Current lab results in epic dated 03-02-2024 for CBC/ CMP COVID test -----patient states asymptomatic no test needed Arrive at ------- 0530 on 03-12-2024 NPO after MN  Pre-Surgery Ensure or G2:  n/a  Med rec completed Medications to take morning of surgery -----  nonw Diabetic medication ----- n/a  GLP1 agonist last dose: n/a GLP1 instructions:  Patient instructed no nail polish to be worn day of surgery Patient instructed to bring photo id and insurance card day of surgery Patient aware to have Driver (ride ) / caregiver    for 24 hours after surgery - husband, Lauren [name] Patient Special Instructions ----- will pick up bag w/ soap and written instructions at lab appt Pre-Op special Instructions -----  Patient verbalized understanding of instructions that were given at this phone interview. Patient denies chest pain, sob, fever, cough at the interview.    Anesthesia review:   HTN;  Chest pain Atypical;  Asthma;  Pre-Diabetes Pt stated has chest pain , comes and goes since 02/ 2025, none past 2-3 weeks.  Stated "right side chest neat boob feels like pushing and tight last a few minutes".  Stated last 3-5 minutes, uses rescue inhaler then fades away.  Pt stated is aware her blood pressure has been high for some time but had not been to see a doctor.  Pt was referred to  a PCP by Dr Elester Grim due to bp being high in the office.  Pt had first new pt PCP visit 03-02-2024 w/ Lauren Hence NP (note in epic).  She was prescribed hydrochlorothiazide 12.5 mg daily, pt stated she started taking that day.  Pt was to start monitor her bp at home, pt stated had not obtained a bp monitor yet, I encouraged to do so.  Also,  PCP sent cardiology referral.

## 2024-03-05 NOTE — Pre-Procedure Instructions (Addendum)
 Surgical Instructions   Your procedure is scheduled on :  Thursday,  03-12-2024. Report to [facility] Main Entrance "A" at 5:30  A.M., then check in with the Admitting office. Any questions or running late day of surgery: call [phone]  Questions prior to your surgery date: call [phone], Monday-Friday, 8am-4pm. If you experience any cold or flu symptoms such as cough, fever, chills, shortness of breath, etc. between now and your scheduled surgery, please notify your surgeon office.    Remember:  Do not eat any food and do not drink any drink liquids after midnight the night before your surgery.  This includes no water,  candy,  gum,  and  mints.    Take these medicines the morning of surgery with A SIP OF WATER :  none   May take these medicines IF NEEDED:  can rinse mouth out Azelastine  (astelin ) nasal spray Albuterol  (ventolin ) rescue inhaler   ~~ Please bring your albuterol  inhaler with you day of surgery.   One week prior to surgery, STOP taking any Aspirin (unless otherwise instructed by your surgeon) Aleve, Naproxen, Ibuprofen , Motrin , Advil , Goody's, BC's, all herbal medications, fish oil, and non-prescription vitamins.                     Do NOT Smoke (Tobacco/Vaping) and Do No drink alcohol for 24 hours prior to your procedure.  If you use a CPAP at night, you may bring your mask/headgear for your overnight stay.   You will be asked to remove any contacts, glasses, piercing's, hearing aid's, dentures/partials prior to surgery. Please bring cases for these items if needed.    Patients discharged the day of surgery will not be allowed to drive home, and someone needs to stay with them for 24 hours.  SURGICAL WAITING ROOM VISITATION Patients may have no more than 2 support people in the waiting area - these visitors may rotate.   Pre-op nurse will coordinate an appropriate time for 1 ADULT support person, who may not rotate, to accompany patient in pre-op.   Children under the age of 82 must have an adult with them who is not the patient and must remain in the main waiting area with an adult.  If the patient needs to stay at the hospital during part of their recovery, the visitor guidelines for inpatient rooms apply.  Please refer to the [facility] website for the visitor guidelines for any additional information.   If you received a COVID test during your pre-op visit  it is requested that you wear a mask when out in public, stay away from anyone that may not be feeling well and notify your surgeon if you develop symptoms. If you have been in contact with anyone that has tested positive in the last 10 days please notify you surgeon.      Pre-operative CHG Bathing Instructions   You can play a key role in reducing the risk of infection after surgery. Your skin needs to be as free of germs as possible. You can reduce the number of germs on your skin by washing with CHG (chlorhexidine gluconate) soap before surgery. CHG is an antiseptic soap that kills germs and continues to kill germs even after washing.   DO NOT use if you have an allergy to chlorhexidine/CHG or antibacterial soaps. If your skin becomes reddened or irritated, stop using the CHG and notify Pre-Op nurse day of surgery.  Please get dial soap or other antibacterial soap and shower  following the instructions below.             TAKE A SHOWER THE NIGHT BEFORE SURGERY AND THE DAY OF SURGERY    Please keep in mind the following:  DO NOT shave, including legs and underarms, 48 hours prior to surgery.   You may shave your face before/day of surgery.  Place clean sheets on your bed the night before surgery Use a clean washcloth (not used since being washed) for each shower. DO NOT sleep with pet's night before surgery.  CHG Shower Instructions:  Wash your face and private area with normal soap. If you choose to wash your hair, wash first with your normal shampoo.  After you use  shampoo/soap, rinse your hair and body thoroughly to remove shampoo/soap residue.  Turn the water OFF and apply half the bottle of CHG soap to a CLEAN washcloth.  Apply CHG soap ONLY FROM YOUR NECK DOWN TO YOUR TOES (washing for 3-5 minutes)  DO NOT use CHG soap on face, private areas, open wounds, or sores.  Pay special attention to the area where your surgery is being performed.  If you are having back surgery, having someone wash your back for you may be helpful. Wait 2 minutes after CHG soap is applied, then you may rinse off the CHG soap.  Pat dry with a clean towel  Put on clean pajamas    Additional instructions for the day of surgery: DO NOT APPLY any lotions, powder's,  oils,  deodorants (may use underarm deodorant) , cologne / perfumes or makeup Do not wear jewelry / piercing's/  metal/  permanent jewelry must be removed prior to arrival day of surgery.  (No plastic piercing) Do not wear nail polish, gel polish, artificial nails, or any other type of covering on natural finger nails (toe nails are okay) Do not bring valuables to the hospital. [facility] is not responsible for valuables/personal belongings. Put on clean/comfortable clothes.  Please brush your teeth.  Ask your nurse before applying any prescription medications to the skin.

## 2024-03-09 ENCOUNTER — Encounter: Payer: Self-pay | Admitting: Obstetrics and Gynecology

## 2024-03-10 ENCOUNTER — Telehealth: Payer: Self-pay

## 2024-03-10 NOTE — Telephone Encounter (Signed)
 Copied from CRM [id number]. Topic: Referral - Question >> Mar 10, 2024 12:24 PM Antwanette L wrote: Reason for CRM: Patient wants to know if Yolanda Hence can remove her referral to see a cardiologist? The patient was scheduled to have surgery on 5/22 with Dr. Birder Bugler but it was cancelled due to the referral on file. Patient is requesting a call back at [phone]

## 2024-03-11 ENCOUNTER — Other Ambulatory Visit (HOSPITAL_COMMUNITY)

## 2024-03-11 ENCOUNTER — Telehealth: Payer: Self-pay | Admitting: Obstetrics and Gynecology

## 2024-03-11 NOTE — Telephone Encounter (Signed)
 Called patient, confirmed ID [id number].  Called patient regarding concerns of recently canceled surgery.  Patient states that she did not have conversation with any staff member regarding the cancellation of her surgery, and just noticed that on her MyChart the surgery been canceled and it had been labeled as patient cancellation.  Patient notes that she had not canceled surgery and had been trying to contact her various providers' offices to understand why her surgery been canceled.  Patient states that when she spoke with her primary care that she noted the chest pain was due to anxiety as it relates to upcoming surgery and being put under anesthesia.  Patient also notes that she had not been made aware of cardiology referral previously.  Reviewed with patient that anesthesia had made recommendation for completion of cardiology workup given the placement of cardiology referral.  Patient understandably frustrated, especially with the fact that she had not had any conversation with anyone regarding cancellation.  Reassurance provided that cancellation was made in an effort to provide the safest care to the patient.  Patient notes that she was able to schedule cardiology appointment tomorrow.  Discussed with patient that I will work with scheduler to reschedule appointment, assuming cardiology workup will be negative.  Patient can send in FMLA paperwork to be revised once new surgical date has been determined.  All questions answered.

## 2024-03-11 NOTE — Progress Notes (Signed)
 Art Largo, FNP Reason for referral-chest pain, hypertension and preoperative evaluation  HPI: 33 year old female for evaluation of chest pain, hypertension and preoperative evaluation at request of Yolanda Hence, FNP.  She is scheduled for total hysterectomy; complained of CP; cardiology asked to evaluate prior to surgery.  Patient states that she was having occasional chest pain once her surgery was scheduled.  She feels as though it was related to anxiety.  It was described as an occasional sharp pain in her chest without radiation or associated symptoms.  Lasted seconds.  Only occurred when she thought about her surgery.  She otherwise can walk up to 2 miles without having any chest pain.  She can also climb 2 flights of stairs with no chest pain.  She has dyspnea with more vigorous activities but not routine activities.  No orthopnea, PND, pedal edema or syncope.  Current Outpatient Medications  Medication Sig Dispense Refill   albuterol  (VENTOLIN  HFA) 108 (90 Base) MCG/ACT inhaler Inhale 2 puffs into the lungs every 6 (six) hours as needed for wheezing or shortness of breath.     azelastine  (ASTELIN ) 0.1 % nasal spray Place 1 spray into both nostrils 2 (two) times daily as needed.     Cholecalciferol (VITAMIN D ) 125 MCG (5000 UT) CAPS Take 1 capsule by mouth daily after breakfast. (Patient taking differently: Take 1 capsule by mouth daily after breakfast.) 30 capsule 11   hydrochlorothiazide  (HYDRODIURIL ) 12.5 MG tablet Take 1 tablet (12.5 mg total) by mouth daily. (Patient taking differently: Take 12.5 mg by mouth daily.) 90 tablet 3   ibuprofen  (ADVIL ) 200 MG tablet Take 800 mg by mouth every 6 (six) hours as needed.     methocarbamol  1000 MG TABS Take 1,000 mg by mouth every 8 (eight) hours as needed for muscle spasms. 15 tablet 0   tranexamic acid  (LYSTEDA ) 650 MG TABS tablet Take 2 tablets (1,300 mg total) by mouth 3 (three) times daily. Take during menses for a maximum of  five days (Patient taking differently: Take 1,300 mg by mouth 3 (three) times daily. Take during menses for a maximum of five days) 30 tablet 2   No current facility-administered medications for this visit.    Allergies  Allergen Reactions   Bee Venom Swelling   Latex Hives   Other Hives and Other (See Comments)    Pt states that she is allergic to Zax[employer]'s zax sauce.     Past Medical History:  Diagnosis Date   Abnormal uterine bleeding (AUB)    Allergic rhinitis    Arthritis    low back   Asthma    BMI 50.0-59.9, adult (HCC)    Chest pain, atypical    03-05-2024  pt stated chest pain come and goes (stated this started in 02/ 2025),  none in past 2-3 wks;  stated ":right side of chest near boob, feels like pushing tight for few minutes",  stated 3-5 minutes,  uses inhaler fades away;  Pt had new PCP visit 03-02-2024 note in epic ,  she  was sent cardiology referral   Chronic back pain    Chronic constipation    pt stated during menses   Dyspnea    03-05-2024 pt stated with long distance walking,  no sob w/ stairs,  but with back pain unable to do house hold chores without sob at times   Family history of adverse reaction to anesthesia    mother and pt son---- hard to wake and had hallucinations  GERD (gastroesophageal reflux disease)    pt stated with certain foods   History of gestational diabetes    with first pregnancy   History of pregnancy induced hypertension    History of sepsis 06/2016   ED/ admission in epic;  dx sepsis secondary to acute pyelonephritis/ UTI in postpartum state   Hyperlipidemia    Hypertension    03-05-2024 pt new PCP office visit in epic 03-02-2024,  started on HCTZ, bp was 155/98 and told to monitor at home ,  but today stated had gotten a bp monitor yet   Iron  deficiency anemia due to chronic blood loss    treated with iron  infusions x2  in   02/ 2025   Pre-diabetes    Vitamin D  deficiency    Wears glasses     Past Surgical History:   Procedure Laterality Date   NO PAST SURGERIES     WISDOM TOOTH EXTRACTION  2012    Social History   Socioeconomic History   Marital status: Married    Spouse name: Not on file   Number of children: 2   Years of education: Not on file   Highest education level: Not on file  Occupational History   Not on file  Tobacco Use   Smoking status: Never   Smokeless tobacco: Never  Vaping Use   Vaping status: Never Used  Substance and Sexual Activity   Alcohol use: No   Drug use: Never   Sexual activity: Yes    Birth control/protection: None  Other Topics Concern   Not on file  Social History Narrative   Not on file   Social Drivers of Health   Financial Resource Strain: Not on file  Food Insecurity: Food Insecurity Present (05/01/2023)   Hunger Vital Sign    Worried About Running Out of Food in the Last Year: Sometimes true    Ran Out of Food in the Last Year: Sometimes true  Transportation Needs: No Transportation Needs (05/01/2023)   PRAPARE - Administrator, Civil Service (Medical): No    Lack of Transportation (Non-Medical): No  Physical Activity: Not on file  Stress: Not on file  Social Connections: Not on file  Intimate Partner Violence: Not on file    Family History  Problem Relation Age of Onset   Hypertension Mother    Diabetes Father    Cancer Other    Asthma Other     ROS: no fevers or chills, productive cough, hemoptysis, dysphasia, odynophagia, melena, hematochezia, dysuria, hematuria, rash, seizure activity, orthopnea, PND, pedal edema, claudication. Remaining systems are negative.  Physical Exam:   Blood pressure 138/84, pulse 86, height 5\' 6"  (1.676 m), weight (!) 313 lb 9.6 oz (142.2 kg), SpO2 99%.  General:  Well developed/obese in NAD Skin warm/dry Patient not depressed No peripheral clubbing Back-normal HEENT-normal/normal eyelids Neck supple/normal carotid upstroke bilaterally; no bruits; no JVD; no thyromegaly chest - CTA/  normal expansion CV - RRR/normal S1 and S2; no murmurs, rubs or gallops;  PMI nondisplaced Abdomen -NT/ND, no HSM, no mass, + bowel sounds, no bruit 2+ femoral pulses, no bruits Ext-no edema, chords, 2+ DP Neuro-grossly nonfocal  ECG -Mar 02, 2024-normal sinus rhythm with no significant ST changes.  Personally reviewed  A/P  1 Chest pain-symptoms are extremely atypical.  She only had an occasional sharp pain for seconds in her chest when she thought about her upcoming surgery.  She does not have exertional chest pain.  No plans for further  ischemia evaluation.  2 preoperative evaluation prior to hysterectomy-patient has excellent functional capacity and can walk 2 miles with having no chest pain.  She can also climb 2 flights of stairs with no chest pain.  Electrocardiogram shows no ST changes.  I do not think she requires further cardiac evaluation prior to her upcoming surgery.  3 hypertension-blood pressure is controlled.  Continue present medications and follow-up.  Alexandria Angel, MD

## 2024-03-12 ENCOUNTER — Ambulatory Visit: Attending: Cardiology | Admitting: Cardiology

## 2024-03-12 ENCOUNTER — Encounter: Payer: Self-pay | Admitting: Cardiology

## 2024-03-12 ENCOUNTER — Ambulatory Visit (HOSPITAL_COMMUNITY): Admission: RE | Admit: 2024-03-12 | Source: Home / Self Care | Admitting: Obstetrics and Gynecology

## 2024-03-12 VITALS — BP 138/84 | HR 86 | Ht 66.0 in | Wt 313.6 lb

## 2024-03-12 DIAGNOSIS — Z01810 Encounter for preprocedural cardiovascular examination: Secondary | ICD-10-CM

## 2024-03-12 DIAGNOSIS — Z01818 Encounter for other preprocedural examination: Secondary | ICD-10-CM

## 2024-03-12 DIAGNOSIS — I10 Essential (primary) hypertension: Secondary | ICD-10-CM | POA: Diagnosis not present

## 2024-03-12 DIAGNOSIS — R072 Precordial pain: Secondary | ICD-10-CM

## 2024-03-12 DIAGNOSIS — N939 Abnormal uterine and vaginal bleeding, unspecified: Secondary | ICD-10-CM

## 2024-03-12 HISTORY — DX: Essential (primary) hypertension: I10

## 2024-03-12 HISTORY — DX: Personal history of other complications of pregnancy, childbirth and the puerperium: Z87.59

## 2024-03-12 HISTORY — DX: Hyperlipidemia, unspecified: E78.5

## 2024-03-12 HISTORY — DX: Family history of other specified conditions: Z84.89

## 2024-03-12 HISTORY — DX: Unspecified osteoarthritis, unspecified site: M19.90

## 2024-03-12 HISTORY — DX: Dyspnea, unspecified: R06.00

## 2024-03-12 HISTORY — DX: Vitamin D deficiency, unspecified: E55.9

## 2024-03-12 HISTORY — DX: Body Mass Index (BMI) 40.0 and over, adult: Z684

## 2024-03-12 HISTORY — DX: Localized edema: R60.0

## 2024-03-12 HISTORY — DX: Other chest pain: R07.89

## 2024-03-12 HISTORY — DX: Other chronic pain: G89.29

## 2024-03-12 HISTORY — DX: Abnormal uterine and vaginal bleeding, unspecified: N93.9

## 2024-03-12 HISTORY — DX: Gastro-esophageal reflux disease without esophagitis: K21.9

## 2024-03-12 HISTORY — DX: Presence of spectacles and contact lenses: Z97.3

## 2024-03-12 HISTORY — DX: Prediabetes: R73.03

## 2024-03-12 HISTORY — DX: Allergic rhinitis, unspecified: J30.9

## 2024-03-12 HISTORY — DX: Personal history of gestational diabetes: Z86.32

## 2024-03-12 HISTORY — DX: Other constipation: K59.09

## 2024-03-12 HISTORY — DX: Iron deficiency anemia secondary to blood loss (chronic): D50.0

## 2024-03-12 SURGERY — HYSTERECTOMY, TOTAL, LAPAROSCOPIC, ROBOT-ASSISTED WITH SALPINGECTOMY
Anesthesia: Choice

## 2024-03-12 NOTE — Patient Instructions (Signed)
   Follow-Up: At [facility], you and your health needs are our priority.  As part of our continuing mission to provide you with exceptional heart care, our providers are all part of one team.  This team includes your primary Cardiologist (physician) and Advanced Practice Providers or APPs (Physician Assistants and Nurse Practitioners) who all work together to provide you with the care you need, when you need it.  Your next appointment:   AS NEEDED

## 2024-03-13 ENCOUNTER — Telehealth: Payer: Self-pay

## 2024-03-13 ENCOUNTER — Encounter (HOSPITAL_BASED_OUTPATIENT_CLINIC_OR_DEPARTMENT_OTHER): Payer: Self-pay

## 2024-03-13 NOTE — Telephone Encounter (Signed)
 Patient called back to confirm her availability for surgery on 03/24/24 at 7:30 am. Pre-op instructions and surgery details were provided by phone.

## 2024-03-13 NOTE — Telephone Encounter (Signed)
 I called patient to reschedule surgery w/ Dr. Elester Grim on 03/24/24. Patient states she would like to proceed w/ surgery but needs to see if her husband is able to take time off work. Patient states she will speak with her husband and call me back.

## 2024-03-18 ENCOUNTER — Ambulatory Visit: Admitting: Family Medicine

## 2024-03-18 ENCOUNTER — Encounter (HOSPITAL_COMMUNITY): Payer: Self-pay | Admitting: Obstetrics and Gynecology

## 2024-03-18 NOTE — Progress Notes (Addendum)
 Addendum:  Lauren Isle RN from Pre-op brought to my attention that patient did not have pre-orders in .  When I spoke to patient on 03-18-2024 patient still had previous pre-op orders from when was rescheduled to 03-24-2024, they have disappeared.  I have reentered anesthesia orders and sent inbox message to Dr Elester Grim in epic informed she needs to reenter her orders, also.  Spoke w/ via phone for pre-op interview---  pt Lab needs dos---- urine preg        Lab results------  lab appt 03-23-2024 2 1300 getting CBC/ BMP/ T&S Current EKG in epic/ chart  (03-12-2024) COVID test -----patient states asymptomatic no test needed Arrive at -------  0530 on 03-24-2024 NPO after MN w/ exception sips of wtaer w/ meds Pre-Surgery Ensure or G2: n/a  Med rec completed Medications to take morning of surgery ----- none Diabetic medication ----- n/a  GLP1 agonist last dose: n/a GLP1 instructions:  Patient instructed no nail polish to be worn day of surgery Patient instructed to bring photo id and insurance card day of surgery Patient aware to have Driver (ride ) / caregiver    for 24 hours after surgery - husband, Lauren [name] Patient Special Instructions ----- will pick up bag w/ soap and written instructions at lab appt Pre-Op special Instructions -----  Patient verbalized understanding of instructions that were given at this phone interview. Patient denies chest pain, sob, fever, cough at the interview.   Anesthesia review:  HTN;  Asthma;  Pre-diabetes Atypical chest pain --- when pt pre-op interview completed on 03-05-2024 for surgery scheduled previously on 03-12-2024.  Per PCP note in epic dated 03-02-2024 given same symptoms and was referred to cardiology.  Chart was reviewed w/ anesthesia , Dr Jarrell Merritts [facility],  stated patient needs to have be seen by cardiology prior to have surgery.  Patient was cancelled. Pt is now rescheduled for 03-24-2024.  Pt has office note cardiac clearance by Dr Audery Blazing  dated 03-12-2024 stated EKG w/ no ST changes, no exertional CP ,  no further testing needed prior to surgery. (Copy of note in epic/ chart)

## 2024-03-18 NOTE — Pre-Procedure Instructions (Signed)
 Surgical Instructions   Your procedure is scheduled on :  Tuesday,  03-24-2024. Report to [facility] Main Entrance "A" at 5:30  A.M., then check in with the Admitting office. Any questions or running late day of surgery: call [phone]  Questions prior to your surgery date: call [phone], Monday-Friday, 8am-4pm. If you experience any cold or flu symptoms such as cough, fever, chills, shortness of breath, etc. between now and your scheduled surgery, please notify your surgeon office.    Remember:  Do not eat any food and do not drink any drink liquids after midnight the night before your surgery.  This includes no water,  candy,  gum,  and  mints.    Take these medicines the morning of surgery with A SIP OF WATER :  none   May take these medicines IF NEEDED:  sips of water and  rinse mouth out Azelastine  (astelin ) nasal spray Albuterol  (ventolin ) rescue inhaler   ~~ Please bring your albuterol  inhaler with you day of surgery.   One week prior to surgery, STOP taking any Aspirin (unless otherwise instructed by your surgeon) Aleve, Naproxen, Ibuprofen , Motrin , Advil , Goody's, BC's, all herbal medications, fish oil, and non-prescription vitamins.                     Do NOT Smoke (Tobacco/Vaping) and Do No drink alcohol for 24 hours prior to your procedure.  If you use a CPAP at night, you may bring your mask/headgear for your overnight stay.   You will be asked to remove any contacts, glasses, piercing's, hearing aid's, dentures/partials prior to surgery. Please bring cases for these items if needed.    Patients discharged the day of surgery will not be allowed to drive home, and someone needs to stay with them for 24 hours.  SURGICAL WAITING ROOM VISITATION Patients may have no more than 2 support people in the waiting area - these visitors may rotate.   Pre-op nurse will coordinate an appropriate time for 1 ADULT support person, who may not rotate, to accompany patient in  pre-op.  Children under the age of 61 must have an adult with them who is not the patient and must remain in the main waiting area with an adult.  If the patient needs to stay at the hospital during part of their recovery, the visitor guidelines for inpatient rooms apply.  Please refer to the [facility] website for the visitor guidelines for any additional information.   If you received a COVID test during your pre-op visit  it is requested that you wear a mask when out in public, stay away from anyone that may not be feeling well and notify your surgeon if you develop symptoms. If you have been in contact with anyone that has tested positive in the last 10 days please notify you surgeon.      Pre-operative CHG Bathing Instructions   You can play a key role in reducing the risk of infection after surgery. Your skin needs to be as free of germs as possible. You can reduce the number of germs on your skin by washing with CHG (chlorhexidine gluconate) soap before surgery. CHG is an antiseptic soap that kills germs and continues to kill germs even after washing.   DO NOT use if you have an allergy to chlorhexidine/CHG or antibacterial soaps. If your skin becomes reddened or irritated, stop using the CHG and notify Pre-Op nurse day of surgery.  Please get dial soap or other  antibacterial soap and shower following the instructions below.             TAKE A SHOWER THE NIGHT BEFORE SURGERY AND THE DAY OF SURGERY    Please keep in mind the following:  DO NOT shave, including legs and underarms, 48 hours prior to surgery.   You may shave your face before/day of surgery.  Place clean sheets on your bed the night before surgery Use a clean washcloth (not used since being washed) for each shower. DO NOT sleep with pet's night before surgery.  CHG Shower Instructions:  Wash your face and private area with normal soap. If you choose to wash your hair, wash first with your normal shampoo.  After you  use shampoo/soap, rinse your hair and body thoroughly to remove shampoo/soap residue.  Turn the water OFF and apply half the bottle of CHG soap to a CLEAN washcloth.  Apply CHG soap ONLY FROM YOUR NECK DOWN TO YOUR TOES (washing for 3-5 minutes)  DO NOT use CHG soap on face, private areas, open wounds, or sores.  Pay special attention to the area where your surgery is being performed.  If you are having back surgery, having someone wash your back for you may be helpful. Wait 2 minutes after CHG soap is applied, then you may rinse off the CHG soap.  Pat dry with a clean towel  Put on clean pajamas    Additional instructions for the day of surgery: DO NOT APPLY any lotions,  powder,  oils,  deodorants (may use underarm deodorant) , cologne / perfumes or makeup Do not wear jewelry / piercing's/  metal/  permanent jewelry must be removed prior to arrival day of surgery.  (No plastic piercing) Do not wear nail polish, gel polish, artificial nails, or any other type of covering on natural finger nails (toe nails are okay) Do not bring valuables to the hospital. [facility] is not responsible for valuables/personal belongings. Put on clean/comfortable clothes.  Please brush your teeth.  Ask your nurse before applying any prescription medications to the skin.

## 2024-03-23 ENCOUNTER — Encounter (HOSPITAL_COMMUNITY)
Admission: RE | Admit: 2024-03-23 | Discharge: 2024-03-23 | Disposition: A | Discharge status: Home / Self Care | Source: Ambulatory Visit | Attending: Obstetrics and Gynecology | Admitting: Obstetrics and Gynecology

## 2024-03-23 DIAGNOSIS — Z01812 Encounter for preprocedural laboratory examination: Secondary | ICD-10-CM | POA: Insufficient documentation

## 2024-03-23 DIAGNOSIS — Z01818 Encounter for other preprocedural examination: Secondary | ICD-10-CM

## 2024-03-23 LAB — CBC
HCT: 34.3 % — ABNORMAL LOW (ref 36.0–46.0)
Hemoglobin: 11.1 g/dL — ABNORMAL LOW (ref 12.0–15.0)
MCH: 27.1 pg (ref 26.0–34.0)
MCHC: 32.4 g/dL (ref 30.0–36.0)
MCV: 83.7 fL (ref 80.0–100.0)
Platelets: 289 10*3/uL (ref 150–400)
RBC: 4.1 MIL/uL (ref 3.87–5.11)
RDW: 14.4 % (ref 11.5–15.5)
WBC: 7.3 10*3/uL (ref 4.0–10.5)
nRBC: 0 % (ref 0.0–0.2)

## 2024-03-23 LAB — TYPE AND SCREEN
ABO/RH(D): AB NEG
Antibody Screen: NEGATIVE

## 2024-03-23 LAB — BASIC METABOLIC PANEL WITH GFR
Anion gap: 10 (ref 5–15)
BUN: 9 mg/dL (ref 6–20)
CO2: 24 mmol/L (ref 22–32)
Calcium: 9.2 mg/dL (ref 8.9–10.3)
Chloride: 102 mmol/L (ref 98–111)
Creatinine, Ser: 0.75 mg/dL (ref 0.44–1.00)
GFR, Estimated: >60 mL/min (ref >60)
Glucose, Bld: 158 mg/dL — ABNORMAL HIGH (ref 70–99)
Potassium: 3.8 mmol/L (ref 3.5–5.1)
Sodium: 136 mmol/L (ref 135–145)

## 2024-03-23 NOTE — Anesthesia Preprocedure Evaluation (Addendum)
 Anesthesia Evaluation  Patient identified by MRN, date of birth, ID band Patient awake    Reviewed: Allergy & Precautions, NPO status , Patient's Chart, lab work & pertinent test results  Airway Mallampati: III  TM Distance: >3 FB Neck ROM: Full    Dental no notable dental hx. (+) Teeth Intact, Dental Advisory Given   Pulmonary shortness of breath, asthma    Pulmonary exam normal breath sounds clear to auscultation       Cardiovascular hypertension, Normal cardiovascular exam Rhythm:Regular Rate:Normal     Neuro/Psych  PSYCHIATRIC DISORDERS  Depression       GI/Hepatic ,GERD  Medicated and Controlled,,  Endo/Other    Class 4 obesity  Renal/GU Lab Results      Component                Value               Date                                K                        3.8                 03/23/2024               BUN                      9                   03/23/2024                CREATININE               0.75                03/23/2024                GFRNONAA                 >60                 03/23/2024                   GLUCOSE                  158 (H)             03/23/2024                Musculoskeletal  (+) Arthritis ,    Abdominal   Peds  Hematology Lab Results      Component                Value               Date                      WBC                      7.3                 03/23/2024                HGB  11.1 (L)            03/23/2024                HCT                      34.3 (L)            03/23/2024                MCV                      83.7                03/23/2024                PLT                      289                 03/23/2024              Anesthesia Other Findings All: Latex  Reproductive/Obstetrics                             Anesthesia Physical Anesthesia Plan  ASA: 3  Anesthesia Plan: General   Post-op Pain Management: Ketamine IV*  and Ofirmev  IV (intra-op)*   Induction: Intravenous  PONV Risk Score and Plan: 4 or greater and Midazolam, Dexamethasone , Ondansetron , Treatment may vary due to age or medical condition and Scopolamine patch - Pre-op  Airway Management Planned: Video Laryngoscope Planned and Oral ETT  Additional Equipment: None  Intra-op Plan:   Post-operative Plan: Extubation in OR  Informed Consent: I have reviewed the patients History and Physical, chart, labs and discussed the procedure including the risks, benefits and alternatives for the proposed anesthesia with the patient or authorized representative who has indicated his/her understanding and acceptance.     Dental advisory given  Plan Discussed with: CRNA and Surgeon  Anesthesia Plan Comments:        Anesthesia Quick Evaluation

## 2024-03-24 ENCOUNTER — Other Ambulatory Visit (HOSPITAL_COMMUNITY): Payer: Self-pay

## 2024-03-24 ENCOUNTER — Other Ambulatory Visit: Payer: Self-pay

## 2024-03-24 ENCOUNTER — Encounter (HOSPITAL_COMMUNITY)
Admission: RE | Disposition: A | Discharge status: Home / Self Care | Payer: Self-pay | Source: Home / Self Care | Attending: Obstetrics and Gynecology

## 2024-03-24 ENCOUNTER — Ambulatory Visit (HOSPITAL_COMMUNITY): Admitting: Anesthesiology

## 2024-03-24 ENCOUNTER — Encounter (HOSPITAL_COMMUNITY): Payer: Self-pay | Admitting: Obstetrics and Gynecology

## 2024-03-24 ENCOUNTER — Ambulatory Visit (HOSPITAL_COMMUNITY)
Admission: RE | Admit: 2024-03-24 | Discharge: 2024-03-24 | Disposition: A | Discharge status: Home / Self Care | Attending: Obstetrics and Gynecology | Admitting: Obstetrics and Gynecology

## 2024-03-24 DIAGNOSIS — J45909 Unspecified asthma, uncomplicated: Secondary | ICD-10-CM

## 2024-03-24 DIAGNOSIS — F32A Depression, unspecified: Secondary | ICD-10-CM | POA: Diagnosis not present

## 2024-03-24 DIAGNOSIS — I10 Essential (primary) hypertension: Secondary | ICD-10-CM

## 2024-03-24 DIAGNOSIS — Z01818 Encounter for other preprocedural examination: Secondary | ICD-10-CM

## 2024-03-24 DIAGNOSIS — N939 Abnormal uterine and vaginal bleeding, unspecified: Secondary | ICD-10-CM | POA: Diagnosis not present

## 2024-03-24 DIAGNOSIS — N888 Other specified noninflammatory disorders of cervix uteri: Secondary | ICD-10-CM | POA: Diagnosis not present

## 2024-03-24 DIAGNOSIS — E6689 Other obesity not elsewhere classified: Secondary | ICD-10-CM | POA: Diagnosis not present

## 2024-03-24 DIAGNOSIS — Z6841 Body Mass Index (BMI) 40.0 and over, adult: Secondary | ICD-10-CM | POA: Insufficient documentation

## 2024-03-24 LAB — POCT PREGNANCY, URINE: Preg Test, Ur: NEGATIVE

## 2024-03-24 SURGERY — HYSTERECTOMY, TOTAL, LAPAROSCOPIC, ROBOT-ASSISTED WITH SALPINGECTOMY
Anesthesia: General | Site: Pelvis

## 2024-03-24 MED ORDER — SODIUM CHLORIDE 0.9 % IV SOLN: INTRAVENOUS | Status: DC

## 2024-03-24 MED ORDER — LACTATED RINGERS IV SOLN: INTRAVENOUS | Status: DC

## 2024-03-24 MED ORDER — OXYCODONE HCL 5 MG/5ML PO SOLN: 5.0000 mg | Freq: Once | ORAL | Status: DC | PRN

## 2024-03-24 MED ORDER — ACETAMINOPHEN 10 MG/ML IV SOLN
INTRAVENOUS | Status: DC | PRN
  Administered 2024-03-24: 1000 mg via INTRAVENOUS

## 2024-03-24 MED ORDER — FENTANYL CITRATE (PF) 250 MCG/5ML IJ SOLN
INTRAMUSCULAR | Status: DC | PRN
  Administered 2024-03-24: 100 ug via INTRAVENOUS

## 2024-03-24 MED ORDER — KETAMINE HCL 50 MG/5ML IJ SOSY
PREFILLED_SYRINGE | INTRAMUSCULAR | Status: AC
  Filled 2024-03-24: qty 5

## 2024-03-24 MED ORDER — POVIDONE-IODINE 10 % EX SWAB: 2.0000 | Freq: Once | CUTANEOUS | Status: DC

## 2024-03-24 MED ORDER — ACETAMINOPHEN 500 MG PO TABS
1000.0000 mg | ORAL_TABLET | ORAL | Status: AC
  Administered 2024-03-24: 1000 mg via ORAL

## 2024-03-24 MED ORDER — OXYCODONE HCL 5 MG PO TABS
5.0000 mg | ORAL_TABLET | ORAL | Status: DC | PRN
  Administered 2024-03-24: 10 mg via ORAL
  Filled 2024-03-24: qty 2

## 2024-03-24 MED ORDER — MIDAZOLAM HCL 2 MG/2ML IJ SOLN
INTRAMUSCULAR | Status: AC
Start: 2024-03-24 — End: ?
  Filled 2024-03-24: qty 2

## 2024-03-24 MED ORDER — HYDROMORPHONE HCL 1 MG/ML IJ SOLN
INTRAMUSCULAR | Status: AC
  Filled 2024-03-24: qty 1

## 2024-03-24 MED ORDER — CEFAZOLIN SODIUM-DEXTROSE 1-4 GM/50ML-% IV SOLN
INTRAVENOUS | Status: AC
  Filled 2024-03-24: qty 50

## 2024-03-24 MED ORDER — IBUPROFEN 600 MG PO TABS: 600.0000 mg | ORAL_TABLET | Freq: Four times a day (QID) | ORAL | Status: DC

## 2024-03-24 MED ORDER — IBUPROFEN 200 MG PO TABS
800.0000 mg | ORAL_TABLET | Freq: Four times a day (QID) | ORAL | 0 refills | Status: AC | PRN
  Filled 2024-03-24: qty 30, 2d supply, fill #0

## 2024-03-24 MED ORDER — POLYETHYLENE GLYCOL 3350 17 G PO PACK: 17.0000 g | PACK | Freq: Every day | ORAL | Status: DC | PRN

## 2024-03-24 MED ORDER — KETOROLAC TROMETHAMINE 30 MG/ML IJ SOLN
30.0000 mg | Freq: Once | INTRAMUSCULAR | Status: AC | PRN
  Administered 2024-03-24: 30 mg via INTRAVENOUS

## 2024-03-24 MED ORDER — CHLORHEXIDINE GLUCONATE 0.12 % MT SOLN
15.0000 mL | Freq: Once | OROMUCOSAL | Status: AC
  Administered 2024-03-24: 15 mL via OROMUCOSAL

## 2024-03-24 MED ORDER — FLUORESCEIN SODIUM 10 % IV SOLN: INTRAVENOUS | Status: DC | PRN

## 2024-03-24 MED ORDER — 0.9 % SODIUM CHLORIDE (POUR BTL) OPTIME
TOPICAL | Status: DC | PRN
  Administered 2024-03-24: 1000 mL

## 2024-03-24 MED ORDER — ONDANSETRON HCL 4 MG/2ML IJ SOLN: 4.0000 mg | Freq: Once | INTRAMUSCULAR | Status: DC | PRN

## 2024-03-24 MED ORDER — CHLORHEXIDINE GLUCONATE 0.12 % MT SOLN
OROMUCOSAL | Status: AC
  Filled 2024-03-24: qty 15

## 2024-03-24 MED ORDER — SUGAMMADEX SODIUM 200 MG/2ML IV SOLN
INTRAVENOUS | Status: DC | PRN
  Administered 2024-03-24: 250 mg via INTRAVENOUS

## 2024-03-24 MED ORDER — ONDANSETRON HCL 4 MG/2ML IJ SOLN
INTRAMUSCULAR | Status: DC | PRN
  Administered 2024-03-24: 4 mg via INTRAVENOUS

## 2024-03-24 MED ORDER — HYDROMORPHONE HCL 1 MG/ML IJ SOLN
INTRAMUSCULAR | Status: AC
  Filled 2024-03-24: qty 0.5

## 2024-03-24 MED ORDER — HYDROMORPHONE HCL 1 MG/ML IJ SOLN: 0.2000 mg | INTRAMUSCULAR | Status: DC | PRN

## 2024-03-24 MED ORDER — BUPIVACAINE HCL (PF) 0.5 % IJ SOLN
INTRAMUSCULAR | Status: DC | PRN
  Administered 2024-03-24: 10 mL

## 2024-03-24 MED ORDER — CEFAZOLIN SODIUM-DEXTROSE 3-4 GM/150ML-% IV SOLN
3.0000 g | INTRAVENOUS | Status: AC
Start: 2024-03-24 — End: 2024-03-24
  Administered 2024-03-24: 3 g via INTRAVENOUS

## 2024-03-24 MED ORDER — ACETAMINOPHEN 500 MG PO TABS
1000.0000 mg | ORAL_TABLET | Freq: Four times a day (QID) | ORAL | Status: DC
  Administered 2024-03-24: 1000 mg via ORAL
  Filled 2024-03-24: qty 2

## 2024-03-24 MED ORDER — GABAPENTIN 300 MG PO CAPS
ORAL_CAPSULE | ORAL | Status: AC
  Filled 2024-03-24: qty 1

## 2024-03-24 MED ORDER — SODIUM CHLORIDE 0.9 % IR SOLN
Status: DC | PRN
  Administered 2024-03-24: 1000 mL

## 2024-03-24 MED ORDER — PROPOFOL 10 MG/ML IV BOLUS
INTRAVENOUS | Status: AC
  Filled 2024-03-24: qty 20

## 2024-03-24 MED ORDER — SCOPOLAMINE 1 MG/3DAYS TD PT72
1.0000 | MEDICATED_PATCH | TRANSDERMAL | Status: DC
  Administered 2024-03-24: 1.5 mg via TRANSDERMAL

## 2024-03-24 MED ORDER — MIDAZOLAM HCL 2 MG/2ML IJ SOLN
INTRAMUSCULAR | Status: DC | PRN
  Administered 2024-03-24: 2 mg via INTRAVENOUS

## 2024-03-24 MED ORDER — SCOPOLAMINE 1 MG/3DAYS TD PT72: 1.0000 | MEDICATED_PATCH | TRANSDERMAL | Status: DC

## 2024-03-24 MED ORDER — HYDROMORPHONE HCL 1 MG/ML IJ SOLN
0.2500 mg | INTRAMUSCULAR | Status: DC | PRN
  Administered 2024-03-24 (×2): 0.5 mg via INTRAVENOUS

## 2024-03-24 MED ORDER — HYDROCHLOROTHIAZIDE 25 MG PO TABS
12.5000 mg | ORAL_TABLET | Freq: Every day | ORAL | Status: DC
  Administered 2024-03-24: 12.5 mg via ORAL
  Filled 2024-03-24: qty 1

## 2024-03-24 MED ORDER — SCOPOLAMINE 1 MG/3DAYS TD PT72
MEDICATED_PATCH | TRANSDERMAL | Status: AC
  Filled 2024-03-24: qty 1

## 2024-03-24 MED ORDER — ACETAMINOPHEN 10 MG/ML IV SOLN
INTRAVENOUS | Status: AC
  Filled 2024-03-24: qty 100

## 2024-03-24 MED ORDER — ONDANSETRON HCL 4 MG/2ML IJ SOLN
4.0000 mg | Freq: Four times a day (QID) | INTRAMUSCULAR | Status: DC | PRN
  Administered 2024-03-24: 4 mg via INTRAVENOUS
  Filled 2024-03-24: qty 2

## 2024-03-24 MED ORDER — DEXAMETHASONE SODIUM PHOSPHATE 10 MG/ML IJ SOLN
INTRAMUSCULAR | Status: DC | PRN
  Administered 2024-03-24: 10 mg via INTRAVENOUS

## 2024-03-24 MED ORDER — ACETAMINOPHEN 500 MG PO TABS
ORAL_TABLET | ORAL | Status: AC
  Filled 2024-03-24: qty 2

## 2024-03-24 MED ORDER — KETOROLAC TROMETHAMINE 30 MG/ML IJ SOLN: 30.0000 mg | Freq: Four times a day (QID) | INTRAMUSCULAR | Status: DC

## 2024-03-24 MED ORDER — FLUORESCEIN SODIUM 10 % IV SOLN
INTRAVENOUS | Status: AC
  Filled 2024-03-24: qty 5

## 2024-03-24 MED ORDER — ROCURONIUM BROMIDE 10 MG/ML (PF) SYRINGE
PREFILLED_SYRINGE | INTRAVENOUS | Status: DC | PRN
  Administered 2024-03-24: 30 mg via INTRAVENOUS
  Administered 2024-03-24: 90 mg via INTRAVENOUS

## 2024-03-24 MED ORDER — HEMOSTATIC AGENTS (NO CHARGE) OPTIME
TOPICAL | Status: DC | PRN
  Administered 2024-03-24: 1 via TOPICAL

## 2024-03-24 MED ORDER — CEFAZOLIN SODIUM-DEXTROSE 2-4 GM/100ML-% IV SOLN
INTRAVENOUS | Status: DC
Start: 2024-03-24 — End: 2024-03-24
  Filled 2024-03-24: qty 100

## 2024-03-24 MED ORDER — FENTANYL CITRATE (PF) 250 MCG/5ML IJ SOLN
INTRAMUSCULAR | Status: AC
Start: 2024-03-24 — End: ?
  Filled 2024-03-24: qty 5

## 2024-03-24 MED ORDER — LIDOCAINE 2% (20 MG/ML) 5 ML SYRINGE
INTRAMUSCULAR | Status: DC | PRN
  Administered 2024-03-24: 100 mg via INTRAVENOUS

## 2024-03-24 MED ORDER — KETAMINE HCL 10 MG/ML IJ SOLN
INTRAMUSCULAR | Status: DC | PRN
  Administered 2024-03-24: 30 mg via INTRAVENOUS
  Administered 2024-03-24: 20 mg via INTRAVENOUS

## 2024-03-24 MED ORDER — PROPOFOL 10 MG/ML IV BOLUS
INTRAVENOUS | Status: DC | PRN
  Administered 2024-03-24: 200 mg via INTRAVENOUS

## 2024-03-24 MED ORDER — HYDROMORPHONE HCL 1 MG/ML IJ SOLN
INTRAMUSCULAR | Status: DC | PRN
  Administered 2024-03-24 (×2): .25 mg via INTRAVENOUS

## 2024-03-24 MED ORDER — OXYCODONE HCL 5 MG PO TABS
5.0000 mg | ORAL_TABLET | ORAL | 0 refills | Status: DC | PRN
  Filled 2024-03-24: qty 12, 2d supply, fill #0

## 2024-03-24 MED ORDER — ACETAMINOPHEN 500 MG PO TABS
1000.0000 mg | ORAL_TABLET | Freq: Four times a day (QID) | ORAL | 1 refills | Status: AC
  Filled 2024-03-24: qty 90, 12d supply, fill #0

## 2024-03-24 MED ORDER — ORAL CARE MOUTH RINSE: 15.0000 mL | Freq: Once | OROMUCOSAL | Status: AC

## 2024-03-24 MED ORDER — BUPIVACAINE HCL (PF) 0.5 % IJ SOLN
INTRAMUSCULAR | Status: AC
  Filled 2024-03-24: qty 30

## 2024-03-24 MED ORDER — OXYCODONE HCL 5 MG PO TABS: 5.0000 mg | ORAL_TABLET | Freq: Once | ORAL | Status: DC | PRN

## 2024-03-24 MED ORDER — KETOROLAC TROMETHAMINE 30 MG/ML IJ SOLN
INTRAMUSCULAR | Status: AC
  Filled 2024-03-24: qty 1

## 2024-03-24 MED ORDER — GABAPENTIN 300 MG PO CAPS
300.0000 mg | ORAL_CAPSULE | ORAL | Status: AC
  Administered 2024-03-24: 300 mg via ORAL

## 2024-03-24 MED ORDER — FLUORESCEIN SODIUM 10 % IV SOLN
INTRAVENOUS | Status: DC | PRN
  Administered 2024-03-24: 125 mg via INTRAVENOUS

## 2024-03-24 MED ORDER — ONDANSETRON HCL 4 MG PO TABS: 4.0000 mg | ORAL_TABLET | Freq: Four times a day (QID) | ORAL | Status: DC | PRN

## 2024-03-24 SURGICAL SUPPLY — 52 items
APPLICATOR ARISTA FLEXITIP XL (MISCELLANEOUS) IMPLANT
CHLORAPREP W/TINT 26 (MISCELLANEOUS) ×2 IMPLANT
COVER BACK TABLE 60X90IN (DRAPES) ×2 IMPLANT
COVER MAYO STAND STRL (DRAPES) ×2 IMPLANT
COVER TIP SHEARS 8 DVNC (MISCELLANEOUS) ×2 IMPLANT
DEFOGGER SCOPE WARM SEASHARP (MISCELLANEOUS) ×2 IMPLANT
DERMABOND ADVANCED .7 DNX12 (GAUZE/BANDAGES/DRESSINGS) ×2 IMPLANT
DRAPE ARM DVNC X/XI (DISPOSABLE) ×8 IMPLANT
DRAPE COLUMN DVNC XI (DISPOSABLE) ×2 IMPLANT
DRAPE SURG IRRIG POUCH 19X23 (DRAPES) ×2 IMPLANT
DRAPE UTILITY XL STRL (DRAPES) ×2 IMPLANT
DRIVER NDL MEGA SUTCUT DVNCXI (INSTRUMENTS) ×2 IMPLANT
DRIVER NDLE MEGA SUTCUT DVNCXI (INSTRUMENTS) ×2 IMPLANT
ELECTRODE REM PT RTRN 9FT ADLT (ELECTROSURGICAL) ×2 IMPLANT
FORCEPS BPLR FENES DVNC XI (FORCEP) ×2 IMPLANT
FORCEPS PROGRASP DVNC XI (FORCEP) ×2 IMPLANT
GLOVE BIOGEL PI IND STRL 7.0 (GLOVE) ×6 IMPLANT
GLOVE SS N UNI LF 6.5 STRL (GLOVE) IMPLANT
GLOVE SURG SS PI 6.0 STRL IVOR (GLOVE) IMPLANT
GLOVE SURG SS PI 7.0 STRL IVOR (GLOVE) IMPLANT
GOWN STRL REUS W/ TWL LRG LVL3 (GOWN DISPOSABLE) IMPLANT
GOWN STRL REUS W/ TWL XL LVL3 (GOWN DISPOSABLE) ×8 IMPLANT
HEMOSTAT ARISTA ABSORB 3G PWDR (HEMOSTASIS) IMPLANT
HIBICLENS CHG 4% 4OZ BTL (MISCELLANEOUS) ×4 IMPLANT
HOLDER FOLEY CATH W/STRAP (MISCELLANEOUS) ×2 IMPLANT
IRRIGATION SUCT STRKRFLW 2 WTP (MISCELLANEOUS) ×2 IMPLANT
IV NS 1000ML BAXH (IV SOLUTION) IMPLANT
KIT PINK PAD W/HEAD ARE REST (MISCELLANEOUS) ×2 IMPLANT
KIT PINK PAD W/HEAD ARM REST (MISCELLANEOUS) ×2 IMPLANT
KIT TURNOVER KIT B (KITS) ×2 IMPLANT
LEGGING LITHOTOMY PAIR STRL (DRAPES) ×2 IMPLANT
MANIFOLD NEPTUNE II (INSTRUMENTS) IMPLANT
NS IRRIG 1000ML POUR BTL (IV SOLUTION) ×2 IMPLANT
OBTURATOR OPTICALSTD 8 DVNC (TROCAR) ×2 IMPLANT
OCCLUDER COLPOPNEUMO (BALLOONS) ×2 IMPLANT
PACK ROBOT WH (CUSTOM PROCEDURE TRAY) ×2 IMPLANT
PAD OB MATERNITY 11 LF (PERSONAL CARE ITEMS) ×2 IMPLANT
PAD POSITIONING PINK XL (MISCELLANEOUS) ×2 IMPLANT
RUMI II GYRUS 3.5CM BLUE (DISPOSABLE) IMPLANT
SCALPEL HRMNC RUM II 3.5 SILVR (DISPOSABLE) IMPLANT
SCISSORS MNPLR CVD DVNC XI (INSTRUMENTS) ×2 IMPLANT
SEAL UNIV 5-12 XI (MISCELLANEOUS) ×8 IMPLANT
SET CYSTO W/LG BORE CLAMP LF (SET/KITS/TRAYS/PACK) ×2 IMPLANT
SET TRI-LUMEN FLTR TB AIRSEAL (TUBING) ×2 IMPLANT
SPIKE FLUID TRANSFER (MISCELLANEOUS) ×2 IMPLANT
SUT VIC AB 4-0 PS2 18 (SUTURE) ×4 IMPLANT
SUT VLOC 180 0 9IN GS21 (SUTURE) ×2 IMPLANT
TIP UTERINE 6.7X8CM BLUE DISP (MISCELLANEOUS) IMPLANT
TOWEL GREEN STERILE (TOWEL DISPOSABLE) ×2 IMPLANT
TRAY FOLEY W/BAG SLVR 14FR (SET/KITS/TRAYS/PACK) ×2 IMPLANT
TROCAR PORT AIRSEAL 8X120 (TROCAR) ×2 IMPLANT
UNDERPAD 30X36 HEAVY ABSORB (UNDERPADS AND DIAPERS) ×2 IMPLANT

## 2024-03-24 NOTE — Transfer of Care (Signed)
 Immediate Anesthesia Transfer of Care Note  Patient: Lauren [name]  Procedure(s) Performed: HYSTERECTOMY, TOTAL, LAPAROSCOPIC, ROBOT-ASSISTED WITH SALPINGECTOMY (Bilateral: Pelvis) CYSTOSCOPY (Bladder)  Patient Location: PACU  Anesthesia Type:General  Level of Consciousness: awake and alert   Airway & Oxygen Therapy: Patient Spontanous Breathing  Post-op Assessment: Report given to RN  Post vital signs: Reviewed and stable  Last Vitals:  Vitals Value Taken Time  BP 152/105 03/24/24 0950  Temp    Pulse 103 03/24/24 0952  Resp 29 03/24/24 0952  SpO2 97 % 03/24/24 0952  Vitals shown include unfiled device data.  Last Pain:  Vitals:   03/24/24 0621  TempSrc: Oral  PainSc: 0-No pain      Patients Stated Pain Goal: 4 (03/24/24 3244)  Complications: No notable events documented.

## 2024-03-24 NOTE — Op Note (Signed)
 Lauren [name] PROCEDURE DATE: 03/24/2024  PREOPERATIVE DIAGNOSIS: abnormal uterine bleeding  POSTOPERATIVE DIAGNOSIS: abnormal uterine bleeding PROCEDURE:    robotic assisted total laparoscopic hysterectomy, bilateral salpingectomy, cystoscopy SURGEON: Tychelle Purkey, MD ASSISTANT:  Kirstie Percy, PA   An experienced assistant was required given the standard of surgical care given the complexity of the case.  This assistant was needed for exposure, dissection, suctioning, retraction, instrument exchange, and for overall help during the procedure.  INDICATIONS: 33 y.o. [id number] with AUB.  Risks of surgery were discussed with the patient including but not limited to: bleeding which may require transfusion; infection which may require antibiotics; injury to surrounding organs; need for additional procedures including laparotomy;  and other postoperative/anesthesia complications. Written informed consent was obtained.    FINDINGS:  Normal external genitalia, normal appearing cervix Laparoscopically: normal upper abdominal survey, normal  uterus, normal bilateral  fallopian tubes, normal bilateral ovaries, bilateral ureters seen, normal anterior cul de sac, normal posterior cul de sac Cystoscopically: normal bladder wall without apparent injury, bilateral ureteral orifices, fluorescein-stained urine from bilateral ureteral orifices   ANESTHESIA: General INTRAVENOUS FLUIDS:  1200 ml of LR ESTIMATED BLOOD LOSS:  25 ml URINE OUTPUT: 450 ml SPECIMENS: uterus, cervix, bilateral fallopian tubes COMPLICATIONS:  None immediate.   The risks, benefits, and alternatives of surgery were explained, understood, and accepted. Consents were signed. All questions were answered. She was taken to the operating room and general anesthesia was applied without complication. She was placed in the dorsal lithotomy position and her abdomen and vagina were prepped and draped after she had been carefully positioned on the  table. A bimanual exam revealed a 10 week size uterus that was mobile. Her adnexa were not enlarged. A Foley catheter was placed and it drained clear throughout the case. A speculum was placed and the cervix visualized. The cervix was measured and the uterus was sounded to 9 cm. A Rumi uterine manipulator was placed without difficulty.  Gloves were changed and attention was turned to the abdomen. All incisions were infiltrated with local anesthetic. An 8mm incision was made in the LUQ and an optiview airseal trocar was introduced into the abdomen. The Entry was confirmed with low opening intraabdominal pressure and visualization and the abdomen was then insufflated. After good pneumoperitoneum was established, the abdomen was surveyed including the upper abdomen.She was placed in Trendelenburg position and ports were placed in appropriate positions on her abdomen to allow maximum exposure during the robotic case. Specifically, trocars were placed umbilically, in the LLQ and RLQ  These were all placed under direct laparoscopic visualization after infiltration with local anesthetic. The robot was docked and I proceeded with a robotic portion of the case.  The pelvis was inspected and the uterus was found to have the above findings.  The fallopian tubes and ovaries were found to be normal. The remainder of her pelvis appeared normal. The left mesosalpinx was serially cauterized and divided.  The ureters and the infundibulopelvic ligaments were identified. The utero-ovarian ligament was then cauterized and divided. The round ligament was cauterized and cut. The leaves of the broad ligament were separated and the anterior leaf divided down to the level of the cervical cup. The posterior leaf was divided and the uterine vessels were skeletonized. The uterine vessels were cauterized and attention turned to the right side. The right mesosalpinx was cauterized and divided, followed by the utero-ovarian ligament and the  round ligament. The leaves of the broad ligament were divided and the anterior leaf divided  and the bladder flap completed. The posterior leaf was divided and the uterine vessels were skeletonized, cauterized, divided, and lateralized to the cup edge. The left uterine vessels were cauterized, divided and lateralized to the cup edge. The bladder was pushed out of the operative site and an anterior colpotomy was made. The colpotomy incision was extended circumferentially, following the blue outline of the Rumi manipulator. There was mild bleeding that was addressed with bipolar energy. All pedicles were hemostatic.  The uterus was removed from the vagina with the fallopian tube segments. The vaginal cuff was closed with v-lock suture in a continuous fashion.  Excellent hemostasis was noted throughout. The intraabdominal pressure was lowered assess hemostasis. There was was minimal bleeding so arista was applied to the cuff and excellent hemostasis was achieved. The robot was undocked. At this point I performed cystoscopy. The cystoscopy revealed fluorescein ejection from both ureters.   The skin from all of the other ports was closed with 4-0 vicryl and covered with dermabond.  The patient was then extubated and taken to recovery in stable condition.   Sponge, lap and needle counts were correct x 2.    Lauren Pelton, MD Minimally Invasive Gynecologic Surgery  Obstetrics and Gynecology, [facility] for [facility], [facility] 03/24/2024

## 2024-03-24 NOTE — Anesthesia Procedure Notes (Signed)
 Procedure Name: Intubation Date/Time: 03/24/2024 7:39 AM  Performed by: Hershall Lory, CRNAPre-anesthesia Checklist: Patient identified, Emergency Drugs available, Suction available and Patient being monitored Patient Re-evaluated:Patient Re-evaluated prior to induction Oxygen Delivery Method: Circle system utilized Preoxygenation: Pre-oxygenation with 100% oxygen Induction Type: IV induction Ventilation: Mask ventilation without difficulty Grade View: Grade II Tube type: Oral Tube size: 7.0 mm Number of attempts: 1 Airway Equipment and Method: Stylet and Oral airway Placement Confirmation: ETT inserted through vocal cords under direct vision, positive ETCO2 and breath sounds checked- equal and bilateral Secured at: 22 cm Tube secured with: Tape Dental Injury: Teeth and Oropharynx as per pre-operative assessment

## 2024-03-24 NOTE — Discharge Summary (Signed)
 Gynecology Postoperative Discharge Summary  Patient ID: Lauren [name] MRN: [id number] DOB/AGE: [birth year] 33 y.o.  Admit Date: 03/24/2024 Discharge Date: 03/24/2024  Preoperative Diagnoses: Abnormal uterine bleeding   Procedures: Procedure(s) (LRB): HYSTERECTOMY, TOTAL, LAPAROSCOPIC, ROBOT-ASSISTED WITH SALPINGECTOMY (Bilateral) CYSTOSCOPY (N/A)  Hospital Course:  Lauren [name] is a 33 y.o. [id number]  admitted for scheduled surgery.  She underwent the procedures as mentioned above, her operation was uncomplicated. For further details about surgery, please refer to the operative report. Patient had an uncomplicated postoperative course. By time of discharge on POD#0, her pain was controlled on oral pain medications; she was ambulating, voiding without difficulty, tolerating regular diet and passing flatus. She was deemed stable for discharge to home.   Significant Labs:    Latest Ref Rng & Units 03/23/2024    1:20 PM 03/02/2024    3:31 PM 11/18/2023    4:51 PM  CBC  WBC 4.0 - 10.5 K/uL 7.3  6.8  7.8   Hemoglobin 12.0 - 15.0 g/dL 30.8  65.7  84.6   Hematocrit 36.0 - 46.0 % 34.3  32.8  33.0   Platelets 150 - 400 K/uL 289  286  285     Discharge Exam: Blood pressure 133/76, pulse 74, temperature 37.2 C, temperature source Oral, resp. rate 20, height 5\' 6"  (1.676 m), weight (!) 141.5 kg, last menstrual period 03/18/2024, SpO2 97%. General appearance: alert and no distress  Resp: clear to auscultation bilaterally  Cardio: regular rate and rhythm  GI: soft, non-tender; bowel sounds normal; no masses, no organomegaly.  Incision: C/D/I, no erythema, no drainage noted Pelvic: scant blood on pad  Extremities: extremities normal, atraumatic, no cyanosis or edema and Homans sign is negative, no sign of DVT  Discharged Condition: Stable  Disposition: Home   Allergies as of 03/24/2024       Reactions   Bee Venom Swelling   Latex Hives   Other Hives, Other (See Comments)   Pt states that  she is allergic to Zaxby's zax sauce.        Medication List     STOP taking these medications    tranexamic acid  650 MG Tabs tablet Commonly known as: LYSTEDA        TAKE these medications    acetaminophen  500 MG tablet Commonly known as: TYLENOL  Take 2 tablets (1,000 mg total) by mouth every 6 (six) hours.   albuterol  108 (90 Base) MCG/ACT inhaler Commonly known as: VENTOLIN  HFA Inhale 2 puffs into the lungs every 6 (six) hours as needed for wheezing or shortness of breath.   azelastine  0.1 % nasal spray Commonly known as: ASTELIN  Place 1 spray into both nostrils 2 (two) times daily as needed.   hydrochlorothiazide  12.5 MG tablet Commonly known as: HYDRODIURIL  Take 1 tablet (12.5 mg total) by mouth daily.   ibuprofen  200 MG tablet Commonly known as: ADVIL  Take 4 tablets (800 mg total) by mouth every 6 (six) hours as needed.   Methocarbamol  1000 MG Tabs Take 1,000 mg by mouth every 8 (eight) hours as needed for muscle spasms.   oxyCODONE  5 MG immediate release tablet Commonly known as: Oxy IR/ROXICODONE  Take 1 tablet (5 mg total) by mouth every 4 (four) hours as needed for severe pain (pain score 7-10) or breakthrough pain.   Vitamin D  125 MCG (5000 UT) Caps Take 1 capsule by mouth daily after breakfast.       Future Appointments  Date Time Provider Department Center  04/15/2024  2:15 PM Kiki Pelton, MD  [facility]  04/22/2024  9:15 AM Jenelle Mis, FNP [facility]    Total discharge time: 15 minutes   Signed:  Luevenia Saha, PA-C Obstetrics and Gynecology Center for [facility], [facility] 03/24/24 2:04PM

## 2024-03-24 NOTE — Discharge Instructions (Signed)
 Post Op Hysterectomy Instructions Please read the instructions below. Refer to these instructions for the next few weeks. These instructions provide you with general information on caring for yourself after surgery. Your caregiver may also give you specific instructions. While your treatment has been planned according to the most current medical practices available, unavoidable problems sometimes happen. If you have any problems or questions after you leave, please call your caregiver.  HOME CARE INSTRUCTIONS Healing will take time. You will have discomfort, tenderness, swelling and bruising at the operative site for a couple of weeks. This is normal and will get better as time goes on.  Only take over-the-counter or prescription medicines for pain, discomfort or fever as directed by your caregiver.  Do not take aspirin. It can cause bleeding.  Do not drive when taking pain medication.  Follow your caregiver's advice regarding diet, exercise, lifting, driving and general activities.  Resume your usual diet as directed and allowed.  Get plenty of rest and sleep.  Do not douche, use tampons, or have sexual intercourse until your caregiver gives you permission. .  Take your temperature if you feel hot or flushed.  You may shower today when you get home.  No tub bath for one week.   Do not drink alcohol until you are not taking any narcotic pain medications.  Try to have someone home with you for a week or two to help with the household activities.   Be careful over the next two to three weeks with any activities at home that involve lifting, pushing, or pulling.  Listen to your body--if something feels uncomfortable to do, then don't do it. Make sure you and your family understands everything about your operation and recovery.  Walking up stairs is fine. Do not sign any legal documents until you feel normal again.  Keep all your follow-up appointments as recommended by your caregiver.   PLEASE CALL  THE OFFICE IF: There is swelling, redness or increasing pain in the wound area.  Pus is coming from the wound.  You notice a bad smell from the wound or surgical dressing.  You have pain, redness and swelling from the intravenous site.  The wound is breaking open (the edges are not staying together).   You develop pain or bleeding when you urinate.  You develop abnormal vaginal discharge.  You have any type of abnormal reaction or develop an allergy to your medication.  You need stronger pain medication for your pain   SEEK IMMEDIATE MEDICAL CARE: You develop a temperature of 100.5 or higher.  You develop abdominal pain.  You develop chest pain.  You develop shortness of breath.  You pass out.  You develop pain, swelling or redness of your leg.  You develop heavy vaginal bleeding with or without blood clots.   MEDICATIONS: Restart your regular medications BUT wait one week before restarting all vitamins and mineral supplements Use Motrin 800mg  every 8 hours for the next several days.   Take Tylenol 1000mg  every 8 hours for the next several days Use oxycodone 5 mg every 4-6 hours. Taking motrin and tylenol should help reduce how often you use oxycodone.  You may use an over the counter stool softener like Colace or Dulcolax to help with starting a bowel movement.  You can also use miralax (polyethylene glycol). Start the day after you go home.  Warm liquids, fluids, and ambulation help too.  If you have not had a bowel movement in four days, you need to  call the office.

## 2024-03-24 NOTE — Anesthesia Postprocedure Evaluation (Signed)
 Anesthesia Post Note  Patient: SOLEI WUBBEN  Procedure(s) Performed: HYSTERECTOMY, TOTAL, LAPAROSCOPIC, ROBOT-ASSISTED WITH SALPINGECTOMY (Bilateral: Pelvis) CYSTOSCOPY (Bladder)     Patient location during evaluation: PACU Anesthesia Type: General Level of consciousness: awake and alert Pain management: pain level controlled Vital Signs Assessment: post-procedure vital signs reviewed and stable Respiratory status: spontaneous breathing, nonlabored ventilation, respiratory function stable and patient connected to nasal cannula oxygen Cardiovascular status: blood pressure returned to baseline and stable Postop Assessment: no apparent nausea or vomiting Anesthetic complications: no  No notable events documented.  Last Vitals:  Vitals:   03/24/24 1337 03/24/24 1513  BP:  (!) 143/79  Pulse:  67  Resp:  18  Temp:  37.1 C  SpO2: 97% 97%    Last Pain:  Vitals:   03/24/24 1513  TempSrc:   PainSc: 0-No pain                 Rosalita Combe

## 2024-03-24 NOTE — Brief Op Note (Signed)
 03/24/2024  9:23 AM  PATIENT:  Lauren [name]  33 y.o. female  PRE-OPERATIVE DIAGNOSIS:  Abnormal uterine bleeding  POST-OPERATIVE DIAGNOSIS:  Abnormal uterine bleeding  PROCEDURE:  Procedure(s): HYSTERECTOMY, TOTAL, LAPAROSCOPIC, ROBOT-ASSISTED WITH SALPINGECTOMY (Bilateral) CYSTOSCOPY (N/A)  SURGEON:  Surgeons and Role:    Kiki Pelton, MD - Primary  PHYSICIAN ASSISTANT: Kirstie Percy, PA  ASSISTANTS: above   ANESTHESIA:   local and general  EBL:  25 mL   BLOOD ADMINISTERED:none  DRAINS: none   LOCAL MEDICATIONS USED:  MARCAINE      SPECIMEN:  Source of Specimen:  uterus, cervix, bilateral fallopian tubes  DISPOSITION OF SPECIMEN:  PATHOLOGY  COUNTS:  YES  TOURNIQUET:  * No tourniquets in log *  DICTATION: .Note written in EPIC  PLAN OF CARE: extended recovery  PATIENT DISPOSITION:  PACU - hemodynamically stable.   Delay start of Pharmacological VTE agent (>24hrs) due to surgical blood loss or risk of bleeding: not applicable

## 2024-03-24 NOTE — H&P (Signed)
 OB/GYN Pre-Op History and Physical  Lauren [name] is a 33 y.o. [id number] presenting for definitive surgical management.       Past Medical History:  Diagnosis Date   Abnormal uterine bleeding (AUB)    Allergic rhinitis    Arthritis    low back   Asthma    BMI 50.0-59.9, adult (HCC)    Chest pain, atypical    (03-18-2024 pt stated & denies any issues,has sx clearance for 04-03-2024);;   03-05-2024  pt stated chest pain come and goes (stated this started in 02/ 2025),  none in past 2-3 wks;  stated ":right side of chest near boob, feels like pushing tight for few minutes",  stated 3-5 minutes,  uses inhaler fades away;  Pt had new PCP visit 03-02-2024 note in epic ,  she  was sent cardiology referral   Chronic back pain    Chronic constipation    pt stated during menses   Dyspnea    03-05-2024 pt stated with long distance walking,  no sob w/ stairs,  but with back pain unable to do house hold chores without sob at times   Family history of adverse reaction to anesthesia    mother and pt son---- hard to wake and had hallucinations   GERD (gastroesophageal reflux disease)    pt stated with certain foods   History of gestational diabetes    with first pregnancy   History of pregnancy induced hypertension    History of sepsis 06/2016   ED/ admission in epic;  dx sepsis secondary to acute pyelonephritis/ UTI in postpartum state   Hyperlipidemia    Hypertension    03-05-2024 pt new PCP office visit in epic 03-02-2024,  started on HCTZ, bp was 155/98 and told to monitor at home ,  but today stated had gotten a bp monitor yet   Iron  deficiency anemia due to chronic blood loss    treated with iron  infusions x2  in   02/ 2025   Pre-diabetes    Vitamin D  deficiency    Wears glasses     Past Surgical History:  Procedure Laterality Date   NO PAST SURGERIES     WISDOM TOOTH EXTRACTION  2012    OB History  Gravida Para Term Preterm AB Living  3 2 2  1 2   SAB IAB Ectopic Multiple  Live Births   1  0 2    # Outcome Date GA Lbr Len/2nd Weight Sex Type Anes PTL Lv  3 Term 06/07/16 [redacted]w[redacted]d  3605 g F Vag-Spont None  LIV     Birth Comments: extra digit left hand  2 Term 09/17/13 [redacted]w[redacted]d 02:07 / 00:01 3073 g M Vag-Spont None  LIV     Birth Comments: within normal limits, terminal mec  1 IAB 2007            Social History   Socioeconomic History   Marital status: Married    Spouse name: Not on file   Number of children: 2   Years of education: Not on file   Highest education level: Not on file  Occupational History   Not on file  Tobacco Use   Smoking status: Never   Smokeless tobacco: Never  Vaping Use   Vaping status: Never Used  Substance and Sexual Activity   Alcohol use: No   Drug use: Never   Sexual activity: Yes    Birth control/protection: None  Other Topics Concern   Not on file  Social History Narrative   Not on file   Social Drivers of Health   Financial Resource Strain: Not on file  Food Insecurity: Food Insecurity Present (05/01/2023)   Hunger Vital Sign    Worried About Running Out of Food in the Last Year: Sometimes true    Ran Out of Food in the Last Year: Sometimes true  Transportation Needs: No Transportation Needs (05/01/2023)   PRAPARE - Administrator, Civil Service (Medical): No    Lack of Transportation (Non-Medical): No  Physical Activity: Not on file  Stress: Not on file  Social Connections: Not on file    Family History  Problem Relation Age of Onset   Hypertension Mother    Diabetes Father    Cancer Other    Asthma Other     Medications Prior to Admission  Medication Sig Dispense Refill Last Dose/Taking   azelastine  (ASTELIN ) 0.1 % nasal spray Place 1 spray into both nostrils 2 (two) times daily as needed.   Past Month   Cholecalciferol (VITAMIN D ) 125 MCG (5000 UT) CAPS Take 1 capsule by mouth daily after breakfast. (Patient taking differently: Take 1 capsule by mouth daily after breakfast.) 30 capsule 11  03/23/2024   hydrochlorothiazide  (HYDRODIURIL ) 12.5 MG tablet Take 1 tablet (12.5 mg total) by mouth daily. (Patient taking differently: Take 12.5 mg by mouth daily.) 90 tablet 3 03/23/2024   ibuprofen  (ADVIL ) 200 MG tablet Take 800 mg by mouth every 6 (six) hours as needed.   Past Week   methocarbamol  1000 MG TABS Take 1,000 mg by mouth every 8 (eight) hours as needed for muscle spasms. 15 tablet 0 Past Month   tranexamic acid  (LYSTEDA ) 650 MG TABS tablet Take 2 tablets (1,300 mg total) by mouth 3 (three) times daily. Take during menses for a maximum of five days (Patient taking differently: Take 1,300 mg by mouth 3 (three) times daily. Take during menses for a maximum of five days) 30 tablet 2 Unknown   albuterol  (VENTOLIN  HFA) 108 (90 Base) MCG/ACT inhaler Inhale 2 puffs into the lungs every 6 (six) hours as needed for wheezing or shortness of breath.   More than a month    Allergies  Allergen Reactions   Bee Venom Swelling   Latex Hives   Other Hives and Other (See Comments)    Pt states that she is allergic to Zaxby's zax sauce.    Review of Systems: Negative except for what is mentioned in HPI.     Physical Exam: BP (!) 145/115 Comment: rechecked 143/96  Pulse 93   Temp 98.7 F (37.1 C) (Oral)   Resp 16   Ht 5\' 6"  (1.676 m)   Wt (!) 141.5 kg   LMP 03/18/2024   SpO2 99%   BMI 50.36 kg/m  CONSTITUTIONAL: Well-developed, well-nourished and in no acute distress.  HENT:  Normocephalic, atraumatic, External right and left ear normal. Oropharynx is clear and moist EYES: Conjunctivae and EOM are normal. Pupils are equal, round, and reactive to light. No scleral icterus.  NECK: Normal range of motion, supple, no masses SKIN: Skin is warm and dry. No rash noted. Not diaphoretic. No erythema. No pallor. NEUROLGIC: Alert and oriented to person, place, and time. Normal reflexes, muscle tone coordination. No cranial nerve deficit noted. PSYCHIATRIC: Normal mood and affect. Normal behavior.  Normal judgment and thought content. RESPIRATORY: Normal effort PELVIC: Deferred   Pertinent Labs/Studies:   Results for orders placed or performed during the hospital encounter of 03/24/24 (  from the past 72 hours)  Pregnancy, urine POC     Status: None   Collection Time: 03/24/24  5:49 AM  Result Value Ref Range   Preg Test, Ur NEGATIVE NEGATIVE    Comment:        THE SENSITIVITY OF THIS METHODOLOGY IS >24 mIU/mL        Assessment and Plan :ENSLIE SAHOTA is a 33 y.o. [id number] here for RA-TLH, BS, cysto.   Patient desires surgical management with hysterectomy.  The risks of surgery were discussed in detail with the patient including but not limited to: bleeding which may require transfusion or reoperation; infection which may require prolonged hospitalization or re-hospitalization and antibiotic therapy; injury to bowel, bladder, ureters and major vessels or other surrounding organs which may lead to other procedures; formation of adhesions; need for additional procedures including laparotomy or subsequent procedures secondary to intraoperative injury or abnormal pathology; thromboembolic phenomenon; incisional problems and other postoperative or anesthesia complications.  Patient was told that the likelihood that her condition and symptoms will be treated effectively with this surgical management was high; the postoperative expectations were also discussed in detail. The patient also understands the alternative treatment options which were discussed in full. All questions were answered.      Naftoli Penny, M.D. Minimally Invasive Gynecologic Surgery and Pelvic Pain Specialist Attending Obstetrician & Gynecologist, Faculty Practice Center for [employer], [facility]

## 2024-03-25 ENCOUNTER — Ambulatory Visit: Admitting: Obstetrics and Gynecology

## 2024-03-25 ENCOUNTER — Encounter (HOSPITAL_COMMUNITY): Payer: Self-pay | Admitting: Obstetrics and Gynecology

## 2024-03-25 VITALS — BP 131/78 | HR 80 | Wt 316.2 lb

## 2024-03-25 DIAGNOSIS — Z0289 Encounter for other administrative examinations: Secondary | ICD-10-CM

## 2024-03-25 DIAGNOSIS — T8189XA Other complications of procedures, not elsewhere classified, initial encounter: Secondary | ICD-10-CM

## 2024-03-25 LAB — SURGICAL PATHOLOGY

## 2024-03-26 ENCOUNTER — Ambulatory Visit: Payer: Self-pay | Admitting: Obstetrics and Gynecology

## 2024-03-31 NOTE — Progress Notes (Signed)
   POSTOPERATIVE VISIT NOTE   Subjective:     DEBROAH SHUTTLEWORTH is a 33 y.o. [id number] who presents to the clinic for incision check. Notes incision separating and noted blood on her shirt. Did not think she had slept wrong/differently. Otherwise, doing well.   The following portions of the patient's history were reviewed and updated as appropriate: allergies, current medications, past family history, past medical history, past social history, past surgical history, and problem list..   Review of Systems Pertinent items are noted in HPI.    Objective:    BP 131/78   Pulse 80   Wt (!) 316 lb 3.2 oz (143.4 kg)   LMP 03/18/2024   BMI 51.04 kg/m  General:  alert, cooperative, and no distress  Abdomen: soft, minimally tender  Incision:   healing well, left lateral incision separation  Pelvic:   Exam deferred.    Pathology Results: FINAL MICROSCOPIC DIAGNOSIS:   A. UTERUS AND CERVIX, WITH BILATERAL FALLOPIAN TUBES, HYSTERECTOMY:  - Cervix with Nabothian cysts.  - Proliferative endometrium.  - Unremarkable myometrium.  - Bilateral fallopian tubes with fimbriated end.  - Negative for malignancy.    Assessment:   Doing well postoperatively. Operative findings again reviewed. Pathology report discussed.   Plan:    1. Problem involving surgical incision (Primary) Incision with superficial separation. Incision re-approximated with steri strips   Kiki Pelton, MD Obstetrician & Gynecologist, [facility] for [employer], [facility]

## 2024-04-15 ENCOUNTER — Ambulatory Visit: Admitting: Obstetrics and Gynecology

## 2024-04-15 ENCOUNTER — Other Ambulatory Visit: Payer: Self-pay

## 2024-04-15 ENCOUNTER — Encounter: Payer: Self-pay | Admitting: Obstetrics and Gynecology

## 2024-04-15 VITALS — BP 137/90 | HR 90 | Wt 314.0 lb

## 2024-04-15 DIAGNOSIS — Z09 Encounter for follow-up examination after completed treatment for conditions other than malignant neoplasm: Secondary | ICD-10-CM

## 2024-04-15 DIAGNOSIS — T8189XA Other complications of procedures, not elsewhere classified, initial encounter: Secondary | ICD-10-CM

## 2024-04-15 DIAGNOSIS — N946 Dysmenorrhea, unspecified: Secondary | ICD-10-CM

## 2024-04-15 DIAGNOSIS — R102 Pelvic and perineal pain: Secondary | ICD-10-CM

## 2024-04-15 DIAGNOSIS — N939 Abnormal uterine and vaginal bleeding, unspecified: Secondary | ICD-10-CM

## 2024-04-15 NOTE — Progress Notes (Signed)
   POSTOPERATIVE VISIT NOTE   Subjective:     Lauren [name] is a 33 y.o. [id number] who presents to the clinic 3 weeks status post RA-TLH, BS, cysto for abnormal uterine bleeding. Eating a regular diet without difficulty. Bowel movements are normal with dulcolax. Voiding ok, no issues. The patient is not having any pain. Incision: improving  Vaginal bleeding: no bleeding Resumed sexual acitivity: no  Incision have been healing well including the incision that separated    The following portions of the patient's history were reviewed and updated as appropriate: allergies, current medications, past family history, past medical history, past social history, past surgical history, and problem list..   Review of Systems Pertinent items are noted in HPI.    Objective:    BP (!) 137/90   Pulse 90   Wt (!) 314 lb (142.4 kg)   LMP 03/18/2024   BMI 50.68 kg/m  General:  alert, cooperative, and no distress  Abdomen: soft, bowel sounds active, non-tender  Incision:   healing well, no drainage, no erythema, no hernia, no seroma, no swelling, no dehiscence, incision well approximated  Pelvic:   Exam deferred.    Pathology Results: FINAL MICROSCOPIC DIAGNOSIS:   A. UTERUS AND CERVIX, WITH BILATERAL FALLOPIAN TUBES, HYSTERECTOMY:  - Cervix with Nabothian cysts.  - Proliferative endometrium.  - Unremarkable myometrium.  - Bilateral fallopian tubes with fimbriated end.  - Negative for malignancy.    Assessment:   Doing well postoperatively. Operative findings again reviewed. Pathology report discussed.   Plan:    1. Pelvic pain (Primary) Doing well   2. Dysmenorrhea Will continue to monitor   3. Abnormal uterine bleeding Resolve s/p hysterectomy   4. Problem involving surgical incision Incisions well approximated and healing well  5. Postop check Doing well   Discussed return to work at 6 weeks with postop appointment at 8 weeks   Activity restrictions: PELVIC REST FOR  8-12 WEEKS, NO HEAVY LIFTING 6-8 WEEKS Follow up: 8 weeks  Carter Quarry, MD Obstetrician & Gynecologist, [facility] for [employer], [facility]

## 2024-04-20 DIAGNOSIS — Z0289 Encounter for other administrative examinations: Secondary | ICD-10-CM

## 2024-04-22 ENCOUNTER — Encounter: Payer: Self-pay | Admitting: Family Medicine

## 2024-04-22 ENCOUNTER — Other Ambulatory Visit (HOSPITAL_COMMUNITY): Payer: Self-pay

## 2024-04-22 ENCOUNTER — Telehealth: Payer: Self-pay | Admitting: Pharmacy Technician

## 2024-04-22 ENCOUNTER — Ambulatory Visit (INDEPENDENT_AMBULATORY_CARE_PROVIDER_SITE_OTHER): Admitting: Family Medicine

## 2024-04-22 VITALS — BP 132/84 | HR 85 | Ht 66.0 in | Wt 319.0 lb

## 2024-04-22 DIAGNOSIS — R7303 Prediabetes: Secondary | ICD-10-CM | POA: Diagnosis not present

## 2024-04-22 DIAGNOSIS — I10 Essential (primary) hypertension: Secondary | ICD-10-CM | POA: Diagnosis not present

## 2024-04-22 DIAGNOSIS — D649 Anemia, unspecified: Secondary | ICD-10-CM

## 2024-04-22 MED ORDER — WEGOVY 0.25 MG/0.5ML ~~LOC~~ SOAJ: SUBCUTANEOUS | 0 refills | Status: DC

## 2024-04-22 MED ORDER — HYDROCHLOROTHIAZIDE 25 MG PO TABS: 25.0000 mg | ORAL_TABLET | Freq: Every day | ORAL | 1 refills | Status: DC

## 2024-04-22 NOTE — Assessment & Plan Note (Signed)
 BP improved, not at goal. Increase hydrochlorothiazide  25mg  daily. Repeat CMP and follow up in 4 weeks. Recommend heart healthy diet such as Mediterranean diet with whole grains, fruits, vegetable, fish, lean meats, nuts, and olive oil. Limit salt. Encouraged moderate walking, 3-5 times/week for 30-50 minutes each session. Aim for at least 150 minutes.week. Goal should be pace of 3 miles/hours, or walking 1.5 miles in 30 minutes. Avoid tobacco products. Avoid excess alcohol. Take medications as prescribed and bring medications and blood pressure log with cuff to each office visit. Seek medical care for chest pain, palpitations, shortness of breath with exertion, dizziness/lightheadedness, vision changes, recurrent headaches, or swelling of extremities.

## 2024-04-22 NOTE — Telephone Encounter (Signed)
 Pharmacy Patient Advocate Encounter   Received notification from CoverMyMeds that prior authorization for [facility] 0.25MG /0.5ML auto-injectors is required/requested.   Insurance verification completed.   The patient is insured through [facility] .   Per test claim: PA required; PA submitted to above mentioned insurance via Phone Key/confirmation #/[id number] Status is pending

## 2024-04-22 NOTE — Assessment & Plan Note (Signed)
 S/p hysterectomy. Repeat CBC.

## 2024-04-22 NOTE — Assessment & Plan Note (Signed)
 A1c 6.1% is working on diet and exercise, starting [employer]. Recheck A1c. Discussed risks and management.

## 2024-04-22 NOTE — Assessment & Plan Note (Signed)
 BMI 51.49, waist circum 50.5in. Counseled on importance of weight management for overall health. Encouraged low calorie, heart healthy diet and moderate intensity exercise 150 minutes weekly. This is 3-5 times weekly for 30-50 minutes each session. Goal should be pace of 3 miles/hours, or walking 1.5 miles in 30 minutes and include strength training. Discussed risks of obesity. Efforts have been unsuccessful and she would like to pursue medication management. Start Wegovy 0.25mg  weekly and increase as tolerated. Discussed importance of maintenance of exercise routine and heart healthy, low cal, high protein diet. Follow up in 4 weeks.

## 2024-04-22 NOTE — Progress Notes (Signed)
 Subjective:  HPI: Lauren [name] is a 33 y.o. female presenting on 04/22/2024 for Follow-up (Here to f/u on blood sugar results. )   HPI Patient is in today for HTN follow up. She is monitoring at home inconsistently. Readings surrounding her recent hysterectomy remain >130/80 and home readings >140/90. Other concerns include findings of prediabetes on labs and help with weight management. She is interested in [place]. Weight loss efforts have included low calorie heart healthy diet and exercise 30 minutes daily. Her weight has been stable. No personal history of pancreatitis or personal or family hx of MEN2 or MTC.  HYPERTENSION without Chronic Kidney Disease Hypertension status: controlled  Satisfied with current treatment? yes Duration of hypertension: chronic BP monitoring frequency:  a few times a week BP range: 146/98, 138/97, unsure of readings otherwise BP medication side effects:  no Medication compliance: excellent compliance Previous BP meds: hydrochlorothiazide  Aspirin: no Recurrent headaches: no Visual changes: no Palpitations: no Dyspnea: no Chest pain: no Lower extremity edema: no Dizzy/lightheaded: no  WEIGHT MANAGEMENT Starting BMI: 51.49 Current BMI: 51.49 Goal weight: 175 Diet: low calorie heart healthy Exercise: Moderate 30 min 3-4x/wk Mode:   Medication: none Side Effects: n/a   Review of Systems  All other systems reviewed and are negative.   Relevant past medical history reviewed and updated as indicated.   Past Medical History:  Diagnosis Date   Abnormal uterine bleeding (AUB)    Allergic rhinitis    Arthritis    low back   Asthma    BMI 50.0-59.9, adult (HCC)    Chest pain, atypical    (03-18-2024 pt stated & denies any issues,has sx clearance for 04-03-2024);;   03-05-2024  pt stated chest pain come and goes (stated this started in 02/ 2025),  none in past 2-3 wks;  stated :right side of chest near boob, feels like pushing tight for few  minutes,  stated 3-5 minutes,  uses inhaler fades away;  Pt had new PCP visit 03-02-2024 note in epic ,  she  was sent cardiology referral   Chronic back pain    Chronic constipation    pt stated during menses   Dyspnea    03-05-2024 pt stated with long distance walking,  no sob w/ stairs,  but with back pain unable to do house hold chores without sob at times   Family history of adverse reaction to anesthesia    mother and pt son---- hard to wake and had hallucinations   GERD (gastroesophageal reflux disease)    pt stated with certain foods   History of gestational diabetes    with first pregnancy   History of pregnancy induced hypertension    History of sepsis 06/2016   ED/ admission in epic;  dx sepsis secondary to acute pyelonephritis/ UTI in postpartum state   Hyperlipidemia    Hypertension    03-05-2024 pt new PCP office visit in epic 03-02-2024,  started on HCTZ, bp was 155/98 and told to monitor at home ,  but today stated had gotten a bp monitor yet   Iron  deficiency anemia due to chronic blood loss    treated with iron  infusions x2  in   02/ 2025   Pre-diabetes    Vitamin D  deficiency    Wears glasses      Past Surgical History:  Procedure Laterality Date   CYSTOSCOPY N/A 03/24/2024   Procedure: CYSTOSCOPY;  Surgeon: Jeralyn Crutch, MD;  Location: [facility] OR;  Service: Gynecology;  Laterality: N/A;  HYSTERECTOMY, TOTAL, LAPAROSCOPIC, ROBOT-ASSISTED WITH SALPINGECTOMY Bilateral 03/24/2024   Procedure: HYSTERECTOMY, TOTAL, LAPAROSCOPIC, ROBOT-ASSISTED WITH SALPINGECTOMY;  Surgeon: Jeralyn Crutch, MD;  Location: [facility] OR;  Service: Gynecology;  Laterality: Bilateral;   NO PAST SURGERIES     WISDOM TOOTH EXTRACTION  2012    Allergies and medications reviewed and updated.   Current Outpatient Medications:    acetaminophen  (TYLENOL ) 500 MG tablet, Take 2 tablets (1,000 mg total) by mouth every 6 (six) hours., Disp: 90 tablet, Rfl: 1   albuterol  (VENTOLIN  HFA) 108 (90 Base)  MCG/ACT inhaler, Inhale 2 puffs into the lungs every 6 (six) hours as needed for wheezing or shortness of breath., Disp: , Rfl:    azelastine  (ASTELIN ) 0.1 % nasal spray, Place 1 spray into both nostrils 2 (two) times daily as needed., Disp: , Rfl:    Cholecalciferol (VITAMIN D ) 125 MCG (5000 UT) CAPS, Take 1 capsule by mouth daily after breakfast., Disp: 30 capsule, Rfl: 11   ibuprofen  (ADVIL ) 200 MG tablet, Take 4 tablets (800 mg total) by mouth every 6 (six) hours as needed., Disp: 30 tablet, Rfl: 0   methocarbamol  1000 MG TABS, Take 1,000 mg by mouth every 8 (eight) hours as needed for muscle spasms., Disp: 15 tablet, Rfl: 0   WEGOVY 0.25 MG/0.5ML SOAJ, Inject 0.25 mg into the skin once a week for 28 days, THEN 0.5 mg once a week for 28 days. Use this dose for 1 month (4 shots) and then increase to next higher dose., Disp: 6 mL, Rfl: 0   hydrochlorothiazide  (HYDRODIURIL ) 25 MG tablet, Take 1 tablet (25 mg total) by mouth daily., Disp: 90 tablet, Rfl: 1  Allergies  Allergen Reactions   Bee Venom Swelling   Latex Hives   Other Hives and Other (See Comments)    Pt states that she is allergic to Zaxby's zax sauce.    Objective:   BP 132/84   Pulse 85   Ht 5' 6 (1.676 m)   Wt (!) 319 lb (144.7 kg)   LMP 03/18/2024   SpO2 98%   BMI 51.49 kg/m      04/22/2024    9:12 AM 04/15/2024    2:49 PM 03/25/2024    3:58 PM  Vitals with BMI  Height 5' 6    Weight 319 lbs 314 lbs 316 lbs 3 oz  BMI 51.51 50.7 51.06  Systolic 132 137 868  Diastolic 84 90 78  Pulse 85 90 80     Physical Exam Vitals and nursing note reviewed.  Constitutional:      Appearance: Normal appearance. She is obese.  HENT:     Head: Normocephalic and atraumatic.  Cardiovascular:     Rate and Rhythm: Normal rate and regular rhythm.     Pulses: Normal pulses.     Heart sounds: Normal heart sounds.  Pulmonary:     Effort: Pulmonary effort is normal.     Breath sounds: Normal breath sounds.  Skin:    General:  Skin is warm and dry.  Neurological:     General: No focal deficit present.     Mental Status: She is alert and oriented to person, place, and time. Mental status is at baseline.  Psychiatric:        Mood and Affect: Mood normal.        Behavior: Behavior normal.        Thought Content: Thought content normal.        Judgment: Judgment normal.  Assessment & Plan:  Hypertension, unspecified type Assessment & Plan: BP improved, not at goal. Increase hydrochlorothiazide  25mg  daily. Repeat CMP and follow up in 4 weeks. Recommend heart healthy diet such as Mediterranean diet with whole grains, fruits, vegetable, fish, lean meats, nuts, and olive oil. Limit salt. Encouraged moderate walking, 3-5 times/week for 30-50 minutes each session. Aim for at least 150 minutes.week. Goal should be pace of 3 miles/hours, or walking 1.5 miles in 30 minutes. Avoid tobacco products. Avoid excess alcohol. Take medications as prescribed and bring medications and blood pressure log with cuff to each office visit. Seek medical care for chest pain, palpitations, shortness of breath with exertion, dizziness/lightheadedness, vision changes, recurrent headaches, or swelling of extremities.   Orders: -     Comprehensive metabolic panel with GFR; Future -     CBC with Differential/Platelet; Future -     Hemoglobin A1c; Future  Morbid obesity (HCC) Assessment & Plan: BMI 51.49, waist circum 50.5in. Counseled on importance of weight management for overall health. Encouraged low calorie, heart healthy diet and moderate intensity exercise 150 minutes weekly. This is 3-5 times weekly for 30-50 minutes each session. Goal should be pace of 3 miles/hours, or walking 1.5 miles in 30 minutes and include strength training. Discussed risks of obesity. Efforts have been unsuccessful and she would like to pursue medication management. Start Wegovy 0.25mg  weekly and increase as tolerated. Discussed importance of maintenance of  exercise routine and heart healthy, low cal, high protein diet. Follow up in 4 weeks.   Orders: -     Comprehensive metabolic panel with GFR; Future -     CBC with Differential/Platelet; Future -     Hemoglobin A1c; Future  Anemia, unspecified type Assessment & Plan: S/p hysterectomy. Repeat CBC.   Orders: -     CBC with Differential/Platelet; Future  Prediabetes Assessment & Plan: A1c 6.1% is working on diet and exercise, starting Tzhncb. Recheck A1c. Discussed risks and management.    Other orders -     hydroCHLOROthiazide ; Take 1 tablet (25 mg total) by mouth daily.  Dispense: 90 tablet; Refill: 1 -     Wegovy; Inject 0.25 mg into the skin once a week for 28 days, THEN 0.5 mg once a week for 28 days. Use this dose for 1 month (4 shots) and then increase to next higher dose.  Dispense: 6 mL; Refill: 0     Follow up plan: Return in about 4 weeks (around 05/20/2024) for chronic follow-up with labs 1 week prior.  Jeoffrey GORMAN Barrio, FNP

## 2024-04-27 ENCOUNTER — Other Ambulatory Visit (HOSPITAL_COMMUNITY): Payer: Self-pay

## 2024-04-27 NOTE — Telephone Encounter (Signed)
 Pharmacy Patient Advocate Encounter  Received notification from [facility] that Prior Authorization for Wegovy  0.25MG /0.5ML auto-injectors has been Approved.     PA #/Case ID/Reference #: [id number]

## 2024-05-05 ENCOUNTER — Encounter: Payer: Self-pay | Admitting: Obstetrics and Gynecology

## 2024-05-06 ENCOUNTER — Ambulatory Visit: Admitting: Obstetrics and Gynecology

## 2024-05-13 ENCOUNTER — Other Ambulatory Visit

## 2024-05-13 DIAGNOSIS — I10 Essential (primary) hypertension: Secondary | ICD-10-CM

## 2024-05-13 DIAGNOSIS — D649 Anemia, unspecified: Secondary | ICD-10-CM

## 2024-05-14 LAB — CBC WITH DIFFERENTIAL/PLATELET
Absolute Lymphocytes: 2835 {cells}/uL (ref 850–3900)
Absolute Monocytes: 506 {cells}/uL (ref 200–950)
Basophils Absolute: 32 {cells}/uL (ref 0–200)
Basophils Relative: 0.5 %
Eosinophils Absolute: 122 {cells}/uL (ref 15–500)
Eosinophils Relative: 1.9 %
HCT: 36.1 % (ref 35.0–45.0)
Hemoglobin: 11.2 g/dL — ABNORMAL LOW (ref 11.7–15.5)
MCH: 26.4 pg — ABNORMAL LOW (ref 27.0–33.0)
MCHC: 31 g/dL — ABNORMAL LOW (ref 32.0–36.0)
MCV: 85.1 fL (ref 80.0–100.0)
MPV: 10.8 fL (ref 7.5–12.5)
Monocytes Relative: 7.9 %
Neutro Abs: 2906 {cells}/uL (ref 1500–7800)
Neutrophils Relative %: 45.4 %
Platelets: 263 Thousand/uL (ref 140–400)
RBC: 4.24 Million/uL (ref 3.80–5.10)
RDW: 14.3 % (ref 11.0–15.0)
Total Lymphocyte: 44.3 %
WBC: 6.4 Thousand/uL (ref 3.8–10.8)

## 2024-05-14 LAB — COMPREHENSIVE METABOLIC PANEL WITH GFR
AG Ratio: 1.4 (calc) (ref 1.0–2.5)
ALT: 8 U/L (ref 6–29)
AST: 7 U/L — ABNORMAL LOW (ref 10–30)
Albumin: 4.1 g/dL (ref 3.6–5.1)
Alkaline phosphatase (APISO): 47 U/L (ref 31–125)
BUN: 16 mg/dL (ref 7–25)
CO2: 28 mmol/L (ref 20–32)
Calcium: 9.5 mg/dL (ref 8.6–10.2)
Chloride: 103 mmol/L (ref 98–110)
Creat: 0.73 mg/dL (ref 0.50–0.97)
Globulin: 2.9 g/dL (ref 1.9–3.7)
Glucose, Bld: 130 mg/dL — ABNORMAL HIGH (ref 65–99)
Potassium: 4.1 mmol/L (ref 3.5–5.3)
Sodium: 138 mmol/L (ref 135–146)
Total Bilirubin: 0.3 mg/dL (ref 0.2–1.2)
Total Protein: 7 g/dL (ref 6.1–8.1)
eGFR: 112 mL/min/1.73m2 (ref >=60)

## 2024-05-14 LAB — HEMOGLOBIN A1C
Hgb A1c MFr Bld: 6.3 % — ABNORMAL HIGH (ref <5.7)
Mean Plasma Glucose: 134 mg/dL
eAG (mmol/L): 7.4 mmol/L

## 2024-05-20 ENCOUNTER — Ambulatory Visit: Admitting: Family Medicine

## 2024-05-20 ENCOUNTER — Encounter: Payer: Self-pay | Admitting: Family Medicine

## 2024-05-20 DIAGNOSIS — I10 Essential (primary) hypertension: Secondary | ICD-10-CM | POA: Diagnosis not present

## 2024-05-20 DIAGNOSIS — Z6841 Body Mass Index (BMI) 40.0 and over, adult: Secondary | ICD-10-CM | POA: Diagnosis not present

## 2024-05-20 MED ORDER — DOCUSATE SODIUM 250 MG PO CAPS: 250.0000 mg | ORAL_CAPSULE | Freq: Every day | ORAL | 0 refills | Status: AC | PRN

## 2024-05-20 MED ORDER — ALBUTEROL SULFATE HFA 108 (90 BASE) MCG/ACT IN AERS: 2.0000 | INHALATION_SPRAY | Freq: Four times a day (QID) | RESPIRATORY_TRACT | 0 refills | Status: AC | PRN

## 2024-05-20 MED ORDER — AZELASTINE HCL 0.1 % NA SOLN: 1.0000 | Freq: Two times a day (BID) | NASAL | 0 refills | Status: DC | PRN

## 2024-05-20 NOTE — Progress Notes (Signed)
 Subjective:  HPI: Lauren [name] is a 33 y.o. female presenting on 05/20/2024 for Medical Management of Chronic Issues (4wk f/u /Wants to review labs as results were high. )   HPI Patient is in today for follow up for HTN, Obesity. Has been walking on treadmill and outdoors at least BID 30 minutes at moderate pace. Is also working on diet eating high protein heart healthy diet.  She is taking 0.25mg  weekly, as completed 4 doses without side effects.   HYPERTENSION without Chronic Kidney Disease Hypertension status: controlled  Satisfied with current treatment? yes Duration of hypertension: chronic BP monitoring frequency:  not checking BP range:  BP medication side effects:  no Medication compliance: excellent compliance Previous BP meds: hctz Aspirin: no Recurrent headaches: no Visual changes: no Palpitations: no Dyspnea: no Chest pain: no Lower extremity edema: no Dizzy/lightheaded: no  WEIGHT MANAGEMENT Starting BMI: 51.49 Current BMI: 49.29 Goal weight:  Diet: high protein, heart healthy Exercise: Moderate 30 min 3-4x/wk Mode: walking Medication: wegovy  0.25 Side Effects: none     Review of Systems  All other systems reviewed and are negative.   Relevant past medical history reviewed and updated as indicated.   Past Medical History:  Diagnosis Date   Abnormal uterine bleeding (AUB)    Allergic rhinitis    Arthritis    low back   Asthma    BMI 50.0-59.9, adult (HCC)    Chest pain, atypical    (03-18-2024 pt stated & denies any issues,has sx clearance for 04-03-2024);;   03-05-2024  pt stated chest pain come and goes (stated this started in 02/ 2025),  none in past 2-3 wks;  stated :right side of chest near boob, feels like pushing tight for few minutes,  stated 3-5 minutes,  uses inhaler fades away;  Pt had new PCP visit 03-02-2024 note in epic ,  she  was sent cardiology referral   Chronic back pain    Chronic constipation    pt stated during  menses   Dyspnea    03-05-2024 pt stated with long distance walking,  no sob w/ stairs,  but with back pain unable to do house hold chores without sob at times   Family history of adverse reaction to anesthesia    mother and pt son---- hard to wake and had hallucinations   GERD (gastroesophageal reflux disease)    pt stated with certain foods   History of gestational diabetes    with first pregnancy   History of pregnancy induced hypertension    History of sepsis 06/2016   ED/ admission in epic;  dx sepsis secondary to acute pyelonephritis/ UTI in postpartum state   Hyperlipidemia    Hypertension    03-05-2024 pt new PCP office visit in epic 03-02-2024,  started on HCTZ, bp was 155/98 and told to monitor at home ,  but today stated had gotten a bp monitor yet   Iron  deficiency anemia due to chronic blood loss    treated with iron  infusions x2  in   02/ 2025   Pre-diabetes    Vitamin D  deficiency    Wears glasses      Past Surgical History:  Procedure Laterality Date   CYSTOSCOPY N/A 03/24/2024   Procedure: CYSTOSCOPY;  Surgeon: Jeralyn Crutch, MD;  Location: [facility] OR;  Service: Gynecology;  Laterality: N/A;   HYSTERECTOMY, TOTAL, LAPAROSCOPIC, ROBOT-ASSISTED WITH SALPINGECTOMY Bilateral 03/24/2024   Procedure: HYSTERECTOMY, TOTAL, LAPAROSCOPIC, ROBOT-ASSISTED WITH SALPINGECTOMY;  Surgeon: Jeralyn Crutch, MD;  Location:  [facility] OR;  Service: Gynecology;  Laterality: Bilateral;   NO PAST SURGERIES     WISDOM TOOTH EXTRACTION  2012    Allergies and medications reviewed and updated.   Current Outpatient Medications:    acetaminophen  (TYLENOL ) 500 MG tablet, Take 2 tablets (1,000 mg total) by mouth every 6 (six) hours., Disp: 90 tablet, Rfl: 1   albuterol  (VENTOLIN  HFA) 108 (90 Base) MCG/ACT inhaler, Inhale 2 puffs into the lungs every 6 (six) hours as needed for wheezing or shortness of breath., Disp: 8 g, Rfl: 0   Cholecalciferol (VITAMIN D ) 125 MCG (5000 UT) CAPS, Take 1 capsule by  mouth daily after breakfast., Disp: 30 capsule, Rfl: 11   docusate sodium  (COLACE) 250 MG capsule, Take 1 capsule (250 mg total) by mouth daily as needed., Disp: 90 capsule, Rfl: 0   hydrochlorothiazide  (HYDRODIURIL ) 25 MG tablet, Take 1 tablet (25 mg total) by mouth daily., Disp: 90 tablet, Rfl: 1   ibuprofen  (ADVIL ) 200 MG tablet, Take 4 tablets (800 mg total) by mouth every 6 (six) hours as needed., Disp: 30 tablet, Rfl: 0   methocarbamol  1000 MG TABS, Take 1,000 mg by mouth every 8 (eight) hours as needed for muscle spasms., Disp: 15 tablet, Rfl: 0   WEGOVY  0.25 MG/0.5ML SOAJ, Inject 0.25 mg into the skin once a week for 28 days, THEN 0.5 mg once a week for 28 days. Use this dose for 1 month (4 shots) and then increase to next higher dose., Disp: 6 mL, Rfl: 0   azelastine  (ASTELIN ) 0.1 % nasal spray, Place 1 spray into both nostrils 2 (two) times daily as needed., Disp: 30 mL, Rfl: 0  Allergies  Allergen Reactions   Bee Venom Swelling   Latex Hives   Other Hives and Other (See Comments)    Pt states that she is allergic to Zaxby's zax sauce.    Objective:   BP 121/79   Pulse 97   Temp 98.2 F (36.8 C)   Ht 5' 6 (1.676 m)   Wt (!) 305 lb 6.4 oz (138.5 kg)   LMP 03/18/2024   SpO2 97%   BMI 49.29 kg/m      05/20/2024    4:16 PM 04/22/2024    9:12 AM 04/15/2024    2:49 PM  Vitals with BMI  Height 5' 6 5' 6   Weight 305 lbs 6 oz 319 lbs 314 lbs  BMI 49.32 51.51 50.7  Systolic 121 132 862  Diastolic 79 84 90  Pulse 97 85 90     Physical Exam Vitals and nursing note reviewed.  Constitutional:      Appearance: Normal appearance. She is obese.  HENT:     Head: Normocephalic and atraumatic.  Cardiovascular:     Rate and Rhythm: Normal rate and regular rhythm.     Pulses: Normal pulses.     Heart sounds: Normal heart sounds.  Pulmonary:     Effort: Pulmonary effort is normal.     Breath sounds: Normal breath sounds.  Skin:    General: Skin is warm and dry.   Neurological:     General: No focal deficit present.     Mental Status: She is alert and oriented to person, place, and time. Mental status is at baseline.  Psychiatric:        Mood and Affect: Mood normal.        Behavior: Behavior normal.        Thought Content: Thought content normal.  Judgment: Judgment normal.     Assessment & Plan:  Morbid obesity (HCC) Assessment & Plan: BMI 49.29 from 51.49. Counseled on importance of weight management for overall health. Encouraged low calorie, heart healthy diet and moderate intensity exercise 150 minutes weekly. This is 3-5 times weekly for 30-50 minutes each session. Goal should be pace of 3 miles/hours, or walking 1.5 miles in 30 minutes and include strength training. Discussed risks of obesity. Efforts have been unsuccessful and she would like to pursue medication management. Increase Wegovy  0.5mg  weekly and increase as tolerated. Discussed importance of maintenance of exercise routine and heart healthy, low cal, high protein diet. Follow up in 4 weeks virtual   Hypertension, unspecified type Assessment & Plan: Continue hydrochlorothiazide  25mg  daily. Recommend heart healthy diet such as Mediterranean diet with whole grains, fruits, vegetable, fish, lean meats, nuts, and olive oil. Limit salt. Encouraged moderate walking, 3-5 times/week for 30-50 minutes each session. Aim for at least 150 minutes.week. Goal should be pace of 3 miles/hours, or walking 1.5 miles in 30 minutes. Avoid tobacco products. Avoid excess alcohol. Take medications as prescribed and bring medications and blood pressure log with cuff to each office visit. Seek medical care for chest pain, palpitations, shortness of breath with exertion, dizziness/lightheadedness, vision changes, recurrent headaches, or swelling of extremities. Follow up in 3 months   Other orders -     Docusate Sodium ; Take 1 capsule (250 mg total) by mouth daily as needed.  Dispense: 90 capsule;  Refill: 0 -     Albuterol  Sulfate HFA; Inhale 2 puffs into the lungs every 6 (six) hours as needed for wheezing or shortness of breath.  Dispense: 8 g; Refill: 0 -     Azelastine  HCl; Place 1 spray into both nostrils 2 (two) times daily as needed.  Dispense: 30 mL; Refill: 0     Follow up plan: Return in about 4 weeks (around 06/17/2024) for follow-up.  Jeoffrey GORMAN Barrio, FNP

## 2024-05-20 NOTE — Assessment & Plan Note (Signed)
 BMI 49.29 from 51.49. Counseled on importance of weight management for overall health. Encouraged low calorie, heart healthy diet and moderate intensity exercise 150 minutes weekly. This is 3-5 times weekly for 30-50 minutes each session. Goal should be pace of 3 miles/hours, or walking 1.5 miles in 30 minutes and include strength training. Discussed risks of obesity. Efforts have been unsuccessful and she would like to pursue medication management. Increase Wegovy  0.5mg  weekly and increase as tolerated. Discussed importance of maintenance of exercise routine and heart healthy, low cal, high protein diet. Follow up in 4 weeks virtual

## 2024-05-20 NOTE — Assessment & Plan Note (Signed)
 Continue hydrochlorothiazide  25mg  daily. Recommend heart healthy diet such as Mediterranean diet with whole grains, fruits, vegetable, fish, lean meats, nuts, and olive oil. Limit salt. Encouraged moderate walking, 3-5 times/week for 30-50 minutes each session. Aim for at least 150 minutes.week. Goal should be pace of 3 miles/hours, or walking 1.5 miles in 30 minutes. Avoid tobacco products. Avoid excess alcohol. Take medications as prescribed and bring medications and blood pressure log with cuff to each office visit. Seek medical care for chest pain, palpitations, shortness of breath with exertion, dizziness/lightheadedness, vision changes, recurrent headaches, or swelling of extremities. Follow up in 3 months

## 2024-05-27 ENCOUNTER — Ambulatory Visit (INDEPENDENT_AMBULATORY_CARE_PROVIDER_SITE_OTHER): Admitting: Obstetrics and Gynecology

## 2024-05-27 ENCOUNTER — Other Ambulatory Visit (HOSPITAL_COMMUNITY)
Admission: RE | Admit: 2024-05-27 | Discharge: 2024-05-27 | Disposition: A | Discharge status: Home / Self Care | Source: Ambulatory Visit | Attending: Obstetrics and Gynecology | Admitting: Obstetrics and Gynecology

## 2024-05-27 ENCOUNTER — Other Ambulatory Visit: Payer: Self-pay

## 2024-05-27 VITALS — BP 136/90 | HR 84 | Wt 309.2 lb

## 2024-05-27 DIAGNOSIS — Z09 Encounter for follow-up examination after completed treatment for conditions other than malignant neoplasm: Secondary | ICD-10-CM

## 2024-05-27 DIAGNOSIS — R102 Pelvic and perineal pain: Secondary | ICD-10-CM

## 2024-05-27 DIAGNOSIS — N898 Other specified noninflammatory disorders of vagina: Secondary | ICD-10-CM | POA: Insufficient documentation

## 2024-05-27 DIAGNOSIS — K5901 Slow transit constipation: Secondary | ICD-10-CM

## 2024-05-27 NOTE — Progress Notes (Unsigned)
   POSTOPERATIVE VISIT NOTE   Subjective:     Lauren [name] is a 33 y.o. [id number] who presents to the clinic 8 weeks status post RA-TLH, BS, cysto for abnormal uterine bleeding. Eating a regular diet without difficulty. Bowel movements are abnormal with constipation. Has some pain Given stool softener by PCP but has not been helping much. Has not tried miralax . Has the urge to go but won't come out. Stools are hard/firm.  Incision: healing well, intermittent pain but otherwise doing well  Vaginal bleeding: none Resumed sexual acitivity: none  Reports having discharge with odor present  Tried to go back to work but then pulled out due to issues with HR. Will need letter today stating she's ok to return today without restrictions.   The following portions of the patient's history were reviewed and updated as appropriate: allergies, current medications, past family history, past medical history, past social history, past surgical history, and problem list..   Review of Systems Pertinent items are noted in HPI.    Objective:    BP (!) 136/90   Pulse 84   Wt (!) 309 lb 3.2 oz (140.3 kg)   LMP 03/18/2024   BMI 49.91 kg/m  General:  alert, cooperative, and no distress  Abdomen: soft, bowel sounds active, non-tender  Incision:   healing well, no drainage, no erythema, no hernia, no seroma, no swelling, no dehiscence, incision well approximated  Pelvic:   Visually intact, digitally intact and non-tender, mildly tender pelvic floor, small volume discharge       Assessment:   Doing well postoperatively. Operative findings again reviewed. Pathology report discussed.   Plan:   1. Postop check (Primary) Doing well.   2. Pelvic pain Return to PFPT  - Ambulatory referral to Physical Therapy  3. Slow transit constipation Miralax  and PFPT - Ambulatory referral to Physical Therapy  4. Vaginal discharge Vaginitis swab collected - Cervicovaginal ancillary only   Activity  restrictions: pelvic rest x2 weeks otherwise ok to resume all other activities Anticipated return to work: now. Follow up: as needed  Nason Conradt, MD Obstetrician & Gynecologist, [facility] for [employer], [facility]

## 2024-05-27 NOTE — Progress Notes (Unsigned)
 Patient here to follow up on Hysterectomy (June 3rd)  She informed me that she occasionally has pelvic and incision pain but is not severe.

## 2024-05-28 ENCOUNTER — Ambulatory Visit: Payer: Self-pay | Admitting: Obstetrics and Gynecology

## 2024-05-28 LAB — CERVICOVAGINAL ANCILLARY ONLY
Bacterial Vaginitis (gardnerella): NEGATIVE
Candida Glabrata: NEGATIVE
Candida Vaginitis: NEGATIVE
Comment: NEGATIVE
Comment: NEGATIVE
Comment: NEGATIVE

## 2024-06-13 ENCOUNTER — Other Ambulatory Visit: Payer: Self-pay | Admitting: Family Medicine

## 2024-06-14 ENCOUNTER — Other Ambulatory Visit: Payer: Self-pay | Admitting: Family Medicine

## 2024-06-15 ENCOUNTER — Other Ambulatory Visit: Payer: Self-pay | Admitting: Family Medicine

## 2024-06-17 ENCOUNTER — Telehealth: Admitting: Family Medicine

## 2024-06-17 MED ORDER — WEGOVY 0.5 MG/0.5ML ~~LOC~~ SOAJ
0.5000 mg | SUBCUTANEOUS | 0 refills | Status: AC
Start: 2024-06-17 — End: 2024-07-15

## 2024-06-17 MED ORDER — WEGOVY 1 MG/0.5ML ~~LOC~~ SOAJ: 1.0000 mg | SUBCUTANEOUS | 0 refills | Status: DC

## 2024-06-17 NOTE — Assessment & Plan Note (Signed)
 Increase wegovy  to 0.5mg  weekly for 28 days then increase to 1mg  weekly. Notify office if unable to tolerate or having trouble getting increased doses. Continue to eat a high protein diet and exercise. Send me weights via MyChart in 4 weeks and check in.   Counseled on importance of weight management for overall health. Encouraged low calorie, heart healthy diet and moderate intensity exercise 150 minutes weekly. This is 3-5 times weekly for 30-50 minutes each session. Goal should be pace of 3 miles/hours, or walking 1.5 miles in 30 minutes and include strength training. Discussed risks of obesity. Efforts have been unsuccessful and she would like to pursue medication management. Increase Wegovy  0.5mg  weekly and increase as tolerated. Discussed importance of maintenance of exercise routine and heart healthy, low cal, high protein diet.  Follow up in office in 2 months

## 2024-06-17 NOTE — Progress Notes (Addendum)
 Virtual Visit via Video note  I connected with Lauren [name] on 06/17/24 at 1513 by video and verified that I am speaking with the correct person using two identifiers. Lauren [name] is currently located at work and no one is currently with her during visit. The provider, Jeoffrey GORMAN Barrio, FNP is located in their office at time of visit.  I discussed the limitations, risks, security and privacy concerns of performing an evaluation and management service by video and the availability of in person appointments. I also discussed with the patient that there may be a patient responsible charge related to this service. The patient expressed understanding and agreed to proceed.  Subjective: PCP: Barrio Jeoffrey GORMAN, FNP  No chief complaint on file.   HPI Lauren [name] is connected today to follow up on obesity management. She is doing well. Checking her BP daily at work and is <130/80 on hydrochlorothiazide  25mg  daily. Is tolerating Wegovy  0.25mg  weekly. She did have trouble getting the dose increase with the pharmacy. Denies abdominal pain, N/V/D/C. Her most recent weight was 306 lbs.  WEIGHT MANAGEMENT Starting BMI: 51.49 Current BMI: 49.29 Goal weight:  Diet: high protein, heart healthy Exercise: Moderate 30 min 3-4x/wk Mode: walking Medication: wegovy  0.25 Side Effects: none    ROS: Per HPI  Current Outpatient Medications:    acetaminophen  (TYLENOL ) 500 MG tablet, Take 2 tablets (1,000 mg total) by mouth every 6 (six) hours. (Patient not taking: Reported on 05/27/2024), Disp: 90 tablet, Rfl: 1   albuterol  (VENTOLIN  HFA) 108 (90 Base) MCG/ACT inhaler, Inhale 2 puffs into the lungs every 6 (six) hours as needed for wheezing or shortness of breath. (Patient not taking: Reported on 05/27/2024), Disp: 8 g, Rfl: 0   Cholecalciferol (VITAMIN D ) 125 MCG (5000 UT) CAPS, Take 1 capsule by mouth daily after breakfast., Disp: 30 capsule, Rfl: 11   docusate sodium  (COLACE) 250 MG capsule, Take 1 capsule  (250 mg total) by mouth daily as needed. (Patient not taking: Reported on 05/27/2024), Disp: 90 capsule, Rfl: 0   hydrochlorothiazide  (HYDRODIURIL ) 25 MG tablet, Take 1 tablet (25 mg total) by mouth daily., Disp: 90 tablet, Rfl: 1   ibuprofen  (ADVIL ) 200 MG tablet, Take 4 tablets (800 mg total) by mouth every 6 (six) hours as needed. (Patient not taking: Reported on 05/27/2024), Disp: 30 tablet, Rfl: 0   methocarbamol  1000 MG TABS, Take 1,000 mg by mouth every 8 (eight) hours as needed for muscle spasms. (Patient not taking: Reported on 05/27/2024), Disp: 15 tablet, Rfl: 0  Observations/Objective: Physical Exam Constitutional:      General: She is not in acute distress.    Appearance: Normal appearance. She is not toxic-appearing.  Eyes:     Conjunctiva/sclera: Conjunctivae normal.  Pulmonary:     Effort: Pulmonary effort is normal. No respiratory distress.  Skin:    Coloration: Skin is not pale.  Neurological:     General: No focal deficit present.     Mental Status: She is alert and oriented to person, place, and time.  Psychiatric:        Mood and Affect: Mood normal.        Behavior: Behavior normal.        Thought Content: Thought content normal.        Judgment: Judgment normal.    Assessment and Plan: Morbid obesity (HCC) Assessment & Plan: Increase wegovy  to 0.5mg  weekly for 28 days then increase to 1mg  weekly. Notify office if unable to tolerate or  having trouble getting increased doses. Continue to eat a high protein diet and exercise. Send me weights via MyChart in 4 weeks and check in.   Counseled on importance of weight management for overall health. Encouraged low calorie, heart healthy diet and moderate intensity exercise 150 minutes weekly. This is 3-5 times weekly for 30-50 minutes each session. Goal should be pace of 3 miles/hours, or walking 1.5 miles in 30 minutes and include strength training. Discussed risks of obesity. Efforts have been unsuccessful and she would like  to pursue medication management. Increase Wegovy  0.5mg  weekly and increase as tolerated. Discussed importance of maintenance of exercise routine and heart healthy, low cal, high protein diet.  Follow up in office in 2 months     Follow Up Instructions: Return in about 4 weeks (around 07/15/2024) for weight management.   I discussed the assessment and treatment plan with the patient. The patient was provided an opportunity to ask questions and all were answered. The patient agreed with the plan and demonstrated an understanding of the instructions.   The patient was advised to call back or seek an in-person evaluation if the symptoms worsen or if the condition fails to improve as anticipated.  The above assessment and management plan was discussed with the patient. The patient verbalized understanding of and has agreed to the management plan. Patient is aware to call the clinic if symptoms persist or worsen. Patient is aware when to return to the clinic for a follow-up visit. Patient educated on when it is appropriate to go to the emergency department.   Time call ended: 1524  I provided 11 minutes of face-to-face time during this encounter. I personally spent a total of 30 minutes in the care of the patient today including preparing to see the patient, getting/reviewing separately obtained history, performing a medically appropriate exam/evaluation, counseling and educating, and placing orders.    Jeoffrey Barrio, MSN, APRN, FNP-C [facility]

## 2024-07-16 ENCOUNTER — Other Ambulatory Visit: Payer: Self-pay

## 2024-07-16 ENCOUNTER — Encounter: Payer: Self-pay | Admitting: Family Medicine

## 2024-07-16 DIAGNOSIS — R7303 Prediabetes: Secondary | ICD-10-CM

## 2024-07-16 DIAGNOSIS — I10 Essential (primary) hypertension: Secondary | ICD-10-CM

## 2024-07-16 MED ORDER — WEGOVY 1.7 MG/0.75ML ~~LOC~~ SOAJ: 1.7000 mg | SUBCUTANEOUS | 0 refills | Status: DC

## 2024-07-22 NOTE — Addendum Note (Signed)
 Addended by: KAYLA JEOFFREY RAMAN on: 07/22/2024 03:42 PM   Modules accepted: Level of Service

## 2024-08-20 ENCOUNTER — Encounter: Payer: Self-pay | Admitting: Family Medicine

## 2024-08-20 ENCOUNTER — Ambulatory Visit: Admitting: Family Medicine

## 2024-08-20 VITALS — BP 126/78 | HR 83 | Temp 98.1°F | Ht 66.0 in | Wt 302.0 lb

## 2024-08-20 DIAGNOSIS — R7303 Prediabetes: Secondary | ICD-10-CM | POA: Diagnosis not present

## 2024-08-20 DIAGNOSIS — Z23 Encounter for immunization: Secondary | ICD-10-CM

## 2024-08-20 DIAGNOSIS — I10 Essential (primary) hypertension: Secondary | ICD-10-CM

## 2024-08-20 DIAGNOSIS — D649 Anemia, unspecified: Secondary | ICD-10-CM

## 2024-08-20 LAB — CBC WITH DIFFERENTIAL/PLATELET
Absolute Lymphocytes: 2446 {cells}/uL (ref 850–3900)
Absolute Monocytes: 449 {cells}/uL (ref 200–950)
Basophils Absolute: 27 {cells}/uL (ref 0–200)
Basophils Relative: 0.4 %
Eosinophils Absolute: 67 {cells}/uL (ref 15–500)
Eosinophils Relative: 1 %
HCT: 35.7 % (ref 35.0–45.0)
Hemoglobin: 11.3 g/dL — ABNORMAL LOW (ref 11.7–15.5)
MCH: 27.1 pg (ref 27.0–33.0)
MCHC: 31.7 g/dL — ABNORMAL LOW (ref 32.0–36.0)
MCV: 85.6 fL (ref 80.0–100.0)
MPV: 10.3 fL (ref 7.5–12.5)
Monocytes Relative: 6.7 %
Neutro Abs: 3712 {cells}/uL (ref 1500–7800)
Neutrophils Relative %: 55.4 %
Platelets: 282 Thousand/uL (ref 140–400)
RBC: 4.17 Million/uL (ref 3.80–5.10)
RDW: 14.7 % (ref 11.0–15.0)
Total Lymphocyte: 36.5 %
WBC: 6.7 Thousand/uL (ref 3.8–10.8)

## 2024-08-20 LAB — HEMOGLOBIN A1C
Hgb A1c MFr Bld: 5.6 % (ref <5.7)
Mean Plasma Glucose: 114 mg/dL
eAG (mmol/L): 6.3 mmol/L

## 2024-08-20 MED ORDER — WEGOVY 1.7 MG/0.75ML ~~LOC~~ SOAJ
1.7000 mg | SUBCUTANEOUS | 0 refills | Status: DC
Start: 2024-08-20 — End: 2024-08-20

## 2024-08-20 MED ORDER — WEGOVY 2.4 MG/0.75ML ~~LOC~~ SOAJ: 2.4000 mg | SUBCUTANEOUS | 11 refills | Status: DC

## 2024-08-20 NOTE — Addendum Note (Signed)
 Addended by: CORINNA JESUSA SAUNDERS on: 08/20/2024 08:24 AM   Modules accepted: Orders

## 2024-08-20 NOTE — Progress Notes (Signed)
 Subjective:  HPI: Lauren [name] is a 33 y.o. female presenting on 08/20/2024 for Medical Management of Chronic Issues   HPI Patient is in today for chronic condition management and follow up for her HTN and obesity.   HTN: on hydrochlorothiazide  25mg  daily Denies chest pain, palpitations, recurrent headaches, vision changes, lightheadedness, dizziness, dyspnea on exertion, or swelling of extremities.    08/20/2024    7:50 AM 05/27/2024    9:22 AM 05/20/2024    4:16 PM  Vitals with BMI  Height 5' 6  5' 6  Weight 302 lbs 309 lbs 3 oz 305 lbs 6 oz  BMI 48.77 49.93 49.32  Systolic 126 136 878  Diastolic 78 90 79  Pulse 83 84 97   Obesity: tolerating Wegovy  1.7mg  weekly  WEIGHT MANAGEMENT Starting BMI: 51.59 Current BMI: 48.74 Goal weight: 200 Diet: high protein, heart healthy Exercise: Moderate 30 min 3-4x/wk Mode: walking Medication: wegovy  1.7mg  weekly Side Effects: none   Review of Systems  All other systems reviewed and are negative.   Relevant past medical history reviewed and updated as indicated.   Past Medical History:  Diagnosis Date   Abnormal uterine bleeding (AUB)    Allergic rhinitis    Arthritis    low back   Asthma    BMI 50.0-59.9, adult (HCC)    Chest pain, atypical    (03-18-2024 pt stated & denies any issues,has sx clearance for 04-03-2024);;   03-05-2024  pt stated chest pain come and goes (stated this started in 02/ 2025),  none in past 2-3 wks;  stated :right side of chest near boob, feels like pushing tight for few minutes,  stated 3-5 minutes,  uses inhaler fades away;  Pt had new PCP visit 03-02-2024 note in epic ,  she  was sent cardiology referral   Chronic back pain    Chronic constipation    pt stated during menses   Dyspnea    03-05-2024 pt stated with long distance walking,  no sob w/ stairs,  but with back pain unable to do house hold chores without sob at times   Family history of adverse reaction to anesthesia    mother  and pt son---- hard to wake and had hallucinations   GERD (gastroesophageal reflux disease)    pt stated with certain foods   History of gestational diabetes    with first pregnancy   History of pregnancy induced hypertension    History of sepsis 06/2016   ED/ admission in epic;  dx sepsis secondary to acute pyelonephritis/ UTI in postpartum state   Hyperlipidemia    Hypertension    03-05-2024 pt new PCP office visit in epic 03-02-2024,  started on HCTZ, bp was 155/98 and told to monitor at home ,  but today stated had gotten a bp monitor yet   Iron  deficiency anemia due to chronic blood loss    treated with iron  infusions x2  in   02/ 2025   Pre-diabetes    Vitamin D  deficiency    Wears glasses      Past Surgical History:  Procedure Laterality Date   CYSTOSCOPY N/A 03/24/2024   Procedure: CYSTOSCOPY;  Surgeon: Jeralyn Crutch, MD;  Location: [facility] OR;  Service: Gynecology;  Laterality: N/A;   HYSTERECTOMY, TOTAL, LAPAROSCOPIC, ROBOT-ASSISTED WITH SALPINGECTOMY Bilateral 03/24/2024   Procedure: HYSTERECTOMY, TOTAL, LAPAROSCOPIC, ROBOT-ASSISTED WITH SALPINGECTOMY;  Surgeon: Jeralyn Crutch, MD;  Location: [facility] OR;  Service: Gynecology;  Laterality: Bilateral;   NO PAST SURGERIES  WISDOM TOOTH EXTRACTION  2012    Allergies and medications reviewed and updated.   Current Outpatient Medications:    albuterol  (VENTOLIN  HFA) 108 (90 Base) MCG/ACT inhaler, Inhale 2 puffs into the lungs every 6 (six) hours as needed for wheezing or shortness of breath., Disp: 8 g, Rfl: 0   Cholecalciferol (VITAMIN D ) 125 MCG (5000 UT) CAPS, Take 1 capsule by mouth daily after breakfast., Disp: 30 capsule, Rfl: 11   docusate sodium  (COLACE) 250 MG capsule, Take 1 capsule (250 mg total) by mouth daily as needed., Disp: 90 capsule, Rfl: 0   ibuprofen  (ADVIL ) 200 MG tablet, Take 4 tablets (800 mg total) by mouth every 6 (six) hours as needed., Disp: 30 tablet, Rfl: 0   methocarbamol  1000 MG TABS, Take 1,000  mg by mouth every 8 (eight) hours as needed for muscle spasms., Disp: 15 tablet, Rfl: 0   WEGOVY  2.4 MG/0.75ML SOAJ SQ injection, Inject 2.4 mg into the skin once a week., Disp: 3 mL, Rfl: 11   acetaminophen  (TYLENOL ) 500 MG tablet, Take 2 tablets (1,000 mg total) by mouth every 6 (six) hours. (Patient not taking: Reported on 08/20/2024), Disp: 90 tablet, Rfl: 1   hydrochlorothiazide  (HYDRODIURIL ) 25 MG tablet, Take 1 tablet (25 mg total) by mouth daily., Disp: 90 tablet, Rfl: 1  Allergies  Allergen Reactions   Bee Venom Swelling   Latex Hives   Other Hives and Other (See Comments)    Pt states that she is allergic to Zaxby's zax sauce.    Objective:   BP 126/78   Pulse 83   Temp 98.1 F (36.7 C)   Ht 5' 6 (1.676 m)   Wt (!) 302 lb (137 kg)   LMP 03/18/2024   SpO2 99%   BMI 48.74 kg/m      08/20/2024    7:50 AM 05/27/2024    9:22 AM 05/20/2024    4:16 PM  Vitals with BMI  Height 5' 6  5' 6  Weight 302 lbs 309 lbs 3 oz 305 lbs 6 oz  BMI 48.77 49.93 49.32  Systolic 126 136 878  Diastolic 78 90 79  Pulse 83 84 97     Physical Exam Vitals and nursing note reviewed.  Constitutional:      Appearance: Normal appearance. She is obese.  HENT:     Head: Normocephalic and atraumatic.  Cardiovascular:     Rate and Rhythm: Normal rate and regular rhythm.     Pulses: Normal pulses.     Heart sounds: Normal heart sounds.  Pulmonary:     Effort: Pulmonary effort is normal.     Breath sounds: Normal breath sounds.  Skin:    General: Skin is warm and dry.  Neurological:     General: No focal deficit present.     Mental Status: She is alert and oriented to person, place, and time. Mental status is at baseline.  Psychiatric:        Mood and Affect: Mood normal.        Behavior: Behavior normal.        Thought Content: Thought content normal.        Judgment: Judgment normal.     Assessment & Plan:  Hypertension, unspecified type Assessment & Plan: Continue  hydrochlorothiazide  25mg  daily. Recommend heart healthy diet such as Mediterranean diet with whole grains, fruits, vegetable, fish, lean meats, nuts, and olive oil. Limit salt. Encouraged moderate walking, 3-5 times/week for 30-50 minutes each session. Aim for at  least 150 minutes.week. Goal should be pace of 3 miles/hours, or walking 1.5 miles in 30 minutes. Avoid tobacco products. Avoid excess alcohol. Take medications as prescribed and bring medications and blood pressure log with cuff to each office visit. Seek medical care for chest pain, palpitations, shortness of breath with exertion, dizziness/lightheadedness, vision changes, recurrent headaches, or swelling of extremities. Follow up in 3 months  Orders: -     CBC with Differential/Platelet  Morbid obesity (HCC) Assessment & Plan: BMI 48.74 from 51.59, goal weight 20 lbs. Increase wegovy  to 2.4mg  weekly. Notify office if unable to tolerate or having trouble getting increased doses. Continue to eat a high protein diet and exercise.  Counseled on importance of weight management for overall health. Encouraged low calorie, heart healthy diet and moderate intensity exercise 150 minutes weekly. This is 3-5 times weekly for 30-50 minutes each session. Goal should be pace of 3 miles/hours, or walking 1.5 miles in 30 minutes and include strength training. Discussed risks of obesity. 3 month follow up   Prediabetes Assessment & Plan: A1c today  Orders: -     Hemoglobin A1c  Anemia, unspecified type Assessment & Plan: Repeat CBC. S/p hysterectomy. Stool card today  Orders: -     CBC with Differential/Platelet -     Fecal Globin By Immunochemistry  Other orders -     Wegovy ; Inject 2.4 mg into the skin once a week.  Dispense: 3 mL; Refill: 11     Follow up plan: Return in about 3 months (around 11/20/2024) for chronic follow-up with labs 1 week prior.  Jeoffrey GORMAN Barrio, FNP

## 2024-08-20 NOTE — Assessment & Plan Note (Signed)
 Repeat CBC. S/p hysterectomy. Stool card today

## 2024-08-20 NOTE — Assessment & Plan Note (Signed)
 BMI 48.74 from 51.59, goal weight 20 lbs. Increase wegovy  to 2.4mg  weekly. Notify office if unable to tolerate or having trouble getting increased doses. Continue to eat a high protein diet and exercise.  Counseled on importance of weight management for overall health. Encouraged low calorie, heart healthy diet and moderate intensity exercise 150 minutes weekly. This is 3-5 times weekly for 30-50 minutes each session. Goal should be pace of 3 miles/hours, or walking 1.5 miles in 30 minutes and include strength training. Discussed risks of obesity. 3 month follow up

## 2024-08-20 NOTE — Assessment & Plan Note (Signed)
 Continue hydrochlorothiazide  25mg  daily. Recommend heart healthy diet such as Mediterranean diet with whole grains, fruits, vegetable, fish, lean meats, nuts, and olive oil. Limit salt. Encouraged moderate walking, 3-5 times/week for 30-50 minutes each session. Aim for at least 150 minutes.week. Goal should be pace of 3 miles/hours, or walking 1.5 miles in 30 minutes. Avoid tobacco products. Avoid excess alcohol. Take medications as prescribed and bring medications and blood pressure log with cuff to each office visit. Seek medical care for chest pain, palpitations, shortness of breath with exertion, dizziness/lightheadedness, vision changes, recurrent headaches, or swelling of extremities. Follow up in 3 months

## 2024-08-20 NOTE — Assessment & Plan Note (Signed)
-

## 2024-10-16 ENCOUNTER — Other Ambulatory Visit: Payer: Self-pay | Admitting: Family Medicine

## 2024-10-26 ENCOUNTER — Ambulatory Visit (HOSPITAL_BASED_OUTPATIENT_CLINIC_OR_DEPARTMENT_OTHER): Payer: Self-pay

## 2024-11-20 ENCOUNTER — Other Ambulatory Visit

## 2024-11-20 DIAGNOSIS — D649 Anemia, unspecified: Secondary | ICD-10-CM

## 2024-11-20 DIAGNOSIS — I10 Essential (primary) hypertension: Secondary | ICD-10-CM

## 2024-11-23 ENCOUNTER — Ambulatory Visit: Admitting: Family Medicine

## 2024-11-23 ENCOUNTER — Telehealth: Admitting: Family Medicine

## 2024-11-23 ENCOUNTER — Ambulatory Visit: Payer: Self-pay | Admitting: Family Medicine

## 2024-11-23 ENCOUNTER — Encounter: Payer: Self-pay | Admitting: Family Medicine

## 2024-11-23 VITALS — BP 124/90 | HR 87 | Wt 292.0 lb

## 2024-11-23 DIAGNOSIS — R7303 Prediabetes: Secondary | ICD-10-CM | POA: Diagnosis not present

## 2024-11-23 DIAGNOSIS — Z6841 Body Mass Index (BMI) 40.0 and over, adult: Secondary | ICD-10-CM

## 2024-11-23 DIAGNOSIS — D649 Anemia, unspecified: Secondary | ICD-10-CM | POA: Diagnosis not present

## 2024-11-23 DIAGNOSIS — I10 Essential (primary) hypertension: Secondary | ICD-10-CM | POA: Diagnosis not present

## 2024-11-23 MED ORDER — AMLODIPINE BESYLATE 5 MG PO TABS: 5.0000 mg | ORAL_TABLET | Freq: Every day | ORAL | 1 refills | Status: AC

## 2024-11-23 MED ORDER — WEGOVY 0.25 MG/0.5ML ~~LOC~~ SOAJ: 0.2500 mg | SUBCUTANEOUS | 0 refills | Status: AC

## 2024-11-24 ENCOUNTER — Other Ambulatory Visit (HOSPITAL_COMMUNITY): Payer: Self-pay

## 2024-11-25 LAB — COMPREHENSIVE METABOLIC PANEL WITH GFR
AG Ratio: 1.5 (calc) (ref 1.0–2.5)
ALT: 9 U/L (ref 6–29)
AST: 8 U/L — ABNORMAL LOW (ref 10–30)
Albumin: 4.1 g/dL (ref 3.6–5.1)
Alkaline phosphatase (APISO): 36 U/L (ref 31–125)
BUN: 10 mg/dL (ref 7–25)
CO2: 24 mmol/L (ref 20–32)
Calcium: 9 mg/dL (ref 8.6–10.2)
Chloride: 105 mmol/L (ref 98–110)
Creat: 0.78 mg/dL (ref 0.50–0.97)
Globulin: 2.7 g/dL (ref 1.9–3.7)
Glucose, Bld: 121 mg/dL — ABNORMAL HIGH (ref 65–99)
Potassium: 4.3 mmol/L (ref 3.5–5.3)
Sodium: 136 mmol/L (ref 135–146)
Total Bilirubin: 0.5 mg/dL (ref 0.2–1.2)
Total Protein: 6.8 g/dL (ref 6.1–8.1)
eGFR: 103 mL/min/{1.73_m2}

## 2024-11-25 LAB — IRON,TIBC AND FERRITIN PANEL
%SAT: 19 % (ref 16–45)
Ferritin: 48 ng/mL (ref 16–154)
Iron: 66 ug/dL (ref 40–190)
TIBC: 341 ug/dL (ref 250–450)

## 2024-11-25 LAB — TEST AUTHORIZATION

## 2024-11-25 LAB — CBC WITH DIFFERENTIAL/PLATELET
Absolute Lymphocytes: 2871 {cells}/uL (ref 850–3900)
Absolute Monocytes: 462 {cells}/uL (ref 200–950)
Basophils Absolute: 20 {cells}/uL (ref 0–200)
Basophils Relative: 0.3 %
Eosinophils Absolute: 79 {cells}/uL (ref 15–500)
Eosinophils Relative: 1.2 %
HCT: 36 % (ref 35.9–46.0)
Hemoglobin: 11.6 g/dL — ABNORMAL LOW (ref 11.7–15.5)
MCH: 27.5 pg (ref 27.0–33.0)
MCHC: 32.2 g/dL (ref 31.6–35.4)
MCV: 85.3 fL (ref 81.4–101.7)
MPV: 10.5 fL (ref 7.5–12.5)
Monocytes Relative: 7 %
Neutro Abs: 3168 {cells}/uL (ref 1500–7800)
Neutrophils Relative %: 48 %
Platelets: 248 10*3/uL (ref 140–400)
RBC: 4.22 Million/uL (ref 3.80–5.10)
RDW: 14.4 % (ref 11.0–15.0)
Total Lymphocyte: 43.5 %
WBC: 6.6 10*3/uL (ref 3.8–10.8)

## 2024-11-25 LAB — HEMOGLOBIN A1C
Hgb A1c MFr Bld: 5.6 %
Mean Plasma Glucose: 114 mg/dL
eAG (mmol/L): 6.3 mmol/L

## 2024-11-25 LAB — VITAMIN D 25 HYDROXY (VIT D DEFICIENCY, FRACTURES): Vit D, 25-Hydroxy: 24 ng/mL — ABNORMAL LOW (ref 30–100)

## 2024-11-26 ENCOUNTER — Other Ambulatory Visit

## 2024-11-27 ENCOUNTER — Other Ambulatory Visit: Payer: Self-pay

## 2024-11-27 DIAGNOSIS — D649 Anemia, unspecified: Secondary | ICD-10-CM

## 2024-11-27 NOTE — Addendum Note (Signed)
 Addended by: CORINNA JESUSA SAUNDERS on: 11/27/2024 08:49 AM   Modules accepted: Orders
